# Patient Record
Sex: Female | Born: 1950 | Race: White | Hispanic: No | Marital: Married | Smoking: Former smoker
Health system: Southern US, Community
[De-identification: ages and names within clinical notes are randomized; demographics above are authoritative.]

## PROBLEM LIST (undated history)

## (undated) DIAGNOSIS — R918 Other nonspecific abnormal finding of lung field: Secondary | ICD-10-CM

## (undated) DIAGNOSIS — M549 Dorsalgia, unspecified: Secondary | ICD-10-CM

## (undated) DIAGNOSIS — J45909 Unspecified asthma, uncomplicated: Secondary | ICD-10-CM

## (undated) DIAGNOSIS — N200 Calculus of kidney: Secondary | ICD-10-CM

## (undated) DIAGNOSIS — G8929 Other chronic pain: Secondary | ICD-10-CM

## (undated) DIAGNOSIS — N814 Uterovaginal prolapse, unspecified: Secondary | ICD-10-CM

## (undated) DIAGNOSIS — F32A Depression, unspecified: Secondary | ICD-10-CM

## (undated) DIAGNOSIS — E079 Disorder of thyroid, unspecified: Secondary | ICD-10-CM

## (undated) DIAGNOSIS — T847XXA Infection and inflammatory reaction due to other internal orthopedic prosthetic devices, implants and grafts, initial encounter: Secondary | ICD-10-CM

## (undated) DIAGNOSIS — R590 Localized enlarged lymph nodes: Secondary | ICD-10-CM

## (undated) DIAGNOSIS — M5030 Other cervical disc degeneration, unspecified cervical region: Secondary | ICD-10-CM

## (undated) DIAGNOSIS — M1990 Unspecified osteoarthritis, unspecified site: Secondary | ICD-10-CM

## (undated) DIAGNOSIS — F329 Major depressive disorder, single episode, unspecified: Secondary | ICD-10-CM

## (undated) DIAGNOSIS — Z113 Encounter for screening for infections with a predominantly sexual mode of transmission: Secondary | ICD-10-CM

## (undated) DIAGNOSIS — E0590 Thyrotoxicosis, unspecified without thyrotoxic crisis or storm: Secondary | ICD-10-CM

## (undated) DIAGNOSIS — F419 Anxiety disorder, unspecified: Secondary | ICD-10-CM

## (undated) DIAGNOSIS — D649 Anemia, unspecified: Secondary | ICD-10-CM

## (undated) DIAGNOSIS — N390 Urinary tract infection, site not specified: Secondary | ICD-10-CM

## (undated) DIAGNOSIS — I4891 Unspecified atrial fibrillation: Secondary | ICD-10-CM

## (undated) DIAGNOSIS — J449 Chronic obstructive pulmonary disease, unspecified: Secondary | ICD-10-CM

## (undated) DIAGNOSIS — M869 Osteomyelitis, unspecified: Secondary | ICD-10-CM

## (undated) HISTORY — DX: Encounter for screening for infections with a predominantly sexual mode of transmission: Z11.3

## (undated) HISTORY — PX: TOTAL HIP ARTHROPLASTY: SHX124

## (undated) HISTORY — DX: Infection and inflammatory reaction due to other internal orthopedic prosthetic devices, implants and grafts, initial encounter: T84.7XXA

## (undated) HISTORY — PX: BACK SURGERY: SHX140

## (undated) HISTORY — PX: ABDOMINAL HYSTERECTOMY: SHX81

## (undated) HISTORY — DX: Anxiety disorder, unspecified: F41.9

## (undated) HISTORY — DX: Osteomyelitis, unspecified: M86.9

## (undated) HISTORY — PX: ANKLE SURGERY: SHX546

## (undated) HISTORY — DX: Chronic obstructive pulmonary disease, unspecified: J44.9

## (undated) HISTORY — DX: Localized enlarged lymph nodes: R59.0

## (undated) HISTORY — DX: Unspecified osteoarthritis, unspecified site: M19.90

## (undated) HISTORY — DX: Urinary tract infection, site not specified: N39.0

## (undated) HISTORY — DX: Anemia, unspecified: D64.9

## (undated) HISTORY — DX: Calculus of kidney: N20.0

## (undated) HISTORY — DX: Unspecified atrial fibrillation: I48.91

## (undated) HISTORY — DX: Thyrotoxicosis, unspecified without thyrotoxic crisis or storm: E05.90

## (undated) HISTORY — DX: Other nonspecific abnormal finding of lung field: R91.8

## (undated) HISTORY — DX: Unspecified asthma, uncomplicated: J45.909

---

## 1997-12-27 ENCOUNTER — Emergency Department (HOSPITAL_COMMUNITY): Admission: EM | Admit: 1997-12-27 | Discharge: 1997-12-27 | Payer: Self-pay | Admitting: Emergency Medicine

## 1999-02-20 ENCOUNTER — Emergency Department (HOSPITAL_COMMUNITY): Admission: EM | Admit: 1999-02-20 | Discharge: 1999-02-20 | Payer: Self-pay | Admitting: Emergency Medicine

## 2001-10-03 ENCOUNTER — Emergency Department (HOSPITAL_COMMUNITY): Admission: EM | Admit: 2001-10-03 | Discharge: 2001-10-04 | Payer: Self-pay | Admitting: Emergency Medicine

## 2001-10-04 ENCOUNTER — Encounter: Payer: Self-pay | Admitting: Emergency Medicine

## 2001-10-17 ENCOUNTER — Encounter: Payer: Self-pay | Admitting: Neurosurgery

## 2001-10-17 ENCOUNTER — Encounter: Admission: RE | Admit: 2001-10-17 | Discharge: 2001-10-17 | Payer: Self-pay | Admitting: Neurosurgery

## 2001-11-21 ENCOUNTER — Encounter: Payer: Self-pay | Admitting: Neurosurgery

## 2001-11-21 ENCOUNTER — Encounter: Admission: RE | Admit: 2001-11-21 | Discharge: 2001-11-21 | Payer: Self-pay | Admitting: Neurosurgery

## 2001-12-05 ENCOUNTER — Encounter: Admission: RE | Admit: 2001-12-05 | Discharge: 2001-12-05 | Payer: Self-pay | Admitting: Neurosurgery

## 2001-12-05 ENCOUNTER — Encounter: Payer: Self-pay | Admitting: Neurosurgery

## 2002-04-20 ENCOUNTER — Emergency Department (HOSPITAL_COMMUNITY): Admission: EM | Admit: 2002-04-20 | Discharge: 2002-04-20 | Payer: Self-pay | Admitting: Emergency Medicine

## 2002-08-03 ENCOUNTER — Encounter: Payer: Self-pay | Admitting: Emergency Medicine

## 2002-08-03 ENCOUNTER — Inpatient Hospital Stay (HOSPITAL_COMMUNITY): Admission: EM | Admit: 2002-08-03 | Discharge: 2002-08-06 | Payer: Self-pay | Admitting: Emergency Medicine

## 2002-08-17 ENCOUNTER — Ambulatory Visit: Admission: RE | Admit: 2002-08-17 | Discharge: 2002-08-17 | Payer: Self-pay | Admitting: Orthopaedic Surgery

## 2002-09-09 ENCOUNTER — Emergency Department (HOSPITAL_COMMUNITY): Admission: EM | Admit: 2002-09-09 | Discharge: 2002-09-09 | Payer: Self-pay | Admitting: Emergency Medicine

## 2004-12-20 ENCOUNTER — Emergency Department (HOSPITAL_COMMUNITY): Admission: EM | Admit: 2004-12-20 | Discharge: 2004-12-20 | Payer: Self-pay | Admitting: Emergency Medicine

## 2006-07-31 ENCOUNTER — Emergency Department (HOSPITAL_COMMUNITY): Admission: EM | Admit: 2006-07-31 | Discharge: 2006-07-31 | Payer: Self-pay | Admitting: Emergency Medicine

## 2006-10-17 ENCOUNTER — Inpatient Hospital Stay (HOSPITAL_COMMUNITY): Admission: EM | Admit: 2006-10-17 | Discharge: 2006-10-21 | Payer: Self-pay | Admitting: Emergency Medicine

## 2006-10-26 ENCOUNTER — Emergency Department (HOSPITAL_COMMUNITY): Admission: EM | Admit: 2006-10-26 | Discharge: 2006-10-26 | Payer: Self-pay | Admitting: *Deleted

## 2006-11-18 ENCOUNTER — Emergency Department (HOSPITAL_COMMUNITY): Admission: EM | Admit: 2006-11-18 | Discharge: 2006-11-18 | Payer: Self-pay | Admitting: Emergency Medicine

## 2006-11-18 ENCOUNTER — Ambulatory Visit (HOSPITAL_COMMUNITY): Admission: RE | Admit: 2006-11-18 | Discharge: 2006-11-18 | Payer: Self-pay | Admitting: *Deleted

## 2006-11-23 ENCOUNTER — Encounter (INDEPENDENT_AMBULATORY_CARE_PROVIDER_SITE_OTHER): Payer: Self-pay | Admitting: Specialist

## 2006-11-24 ENCOUNTER — Inpatient Hospital Stay (HOSPITAL_COMMUNITY): Admission: RE | Admit: 2006-11-24 | Discharge: 2006-11-25 | Payer: Self-pay | Admitting: Specialist

## 2007-02-18 ENCOUNTER — Emergency Department (HOSPITAL_COMMUNITY): Admission: EM | Admit: 2007-02-18 | Discharge: 2007-02-18 | Payer: Self-pay | Admitting: Emergency Medicine

## 2008-12-04 IMAGING — CT CT PELVIS W/O CM
2 of 3 series · 17 of 46 positions shown, 19 images · IV contrast (agent unspecified)
Comparison: none

CLINICAL DATA: Right flank pain. History of renal stones. 
 ABDOMEN CT WITHOUT CONTRAST:
TECHNIQUE: Multidetector CT imaging of the abdomen was performed following the standard protocol without IV contrast.
TECHNIQUE: Multidetector CT imaging of the pelvis was performed following the standard protocol without IV contrast.

[Series 2: abd/pelv w/o 5.0 b31f st · axial · non-contrast · 0.63mm/px · z∈[+803,+1128]mm · 14 of 75 slices shown, 16 images]
[im 5/75  soft-tissue]
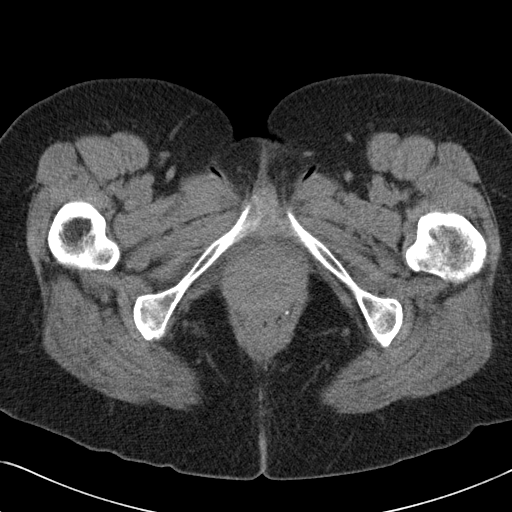
[im 5/75  bone]
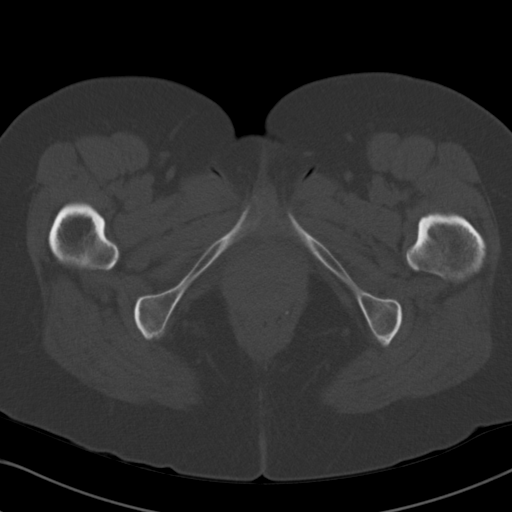
[im 10/75  soft-tissue]
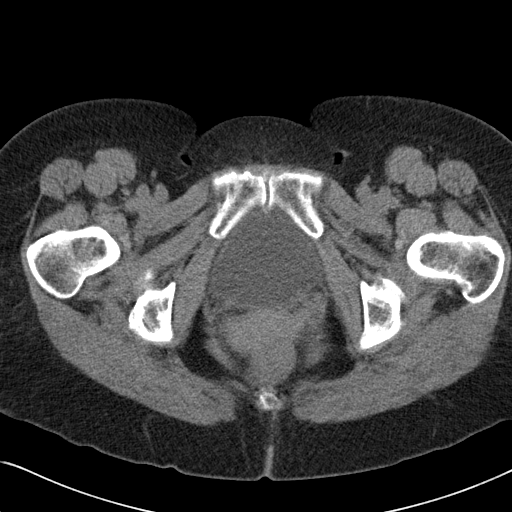
[im 15/75  soft-tissue]
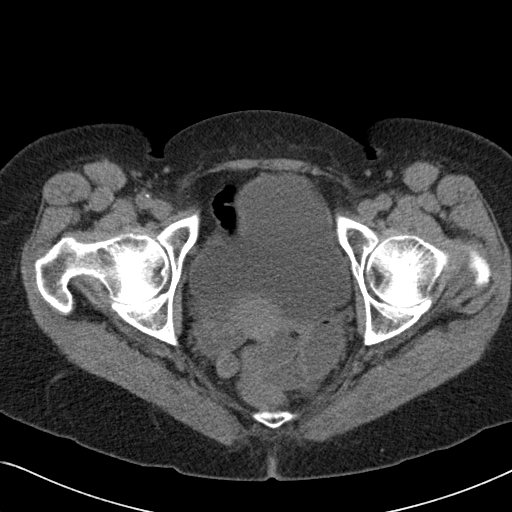
[im 20/75  soft-tissue]
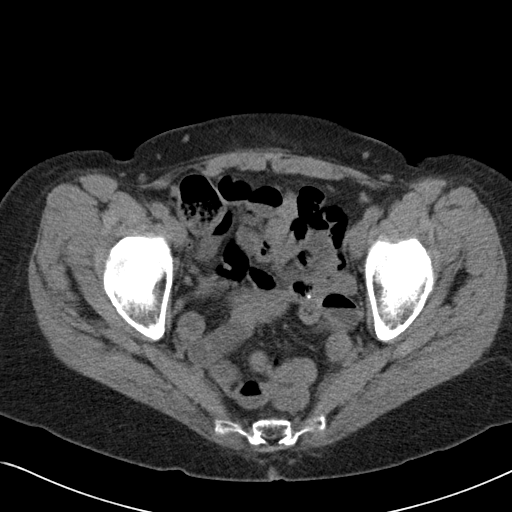
[im 24/75  soft-tissue]
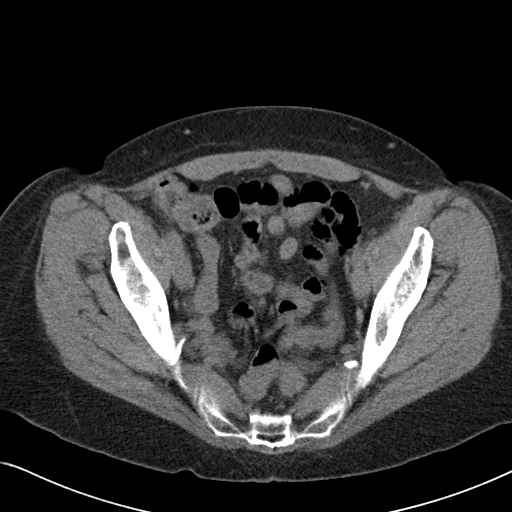
[im 29/75  soft-tissue]
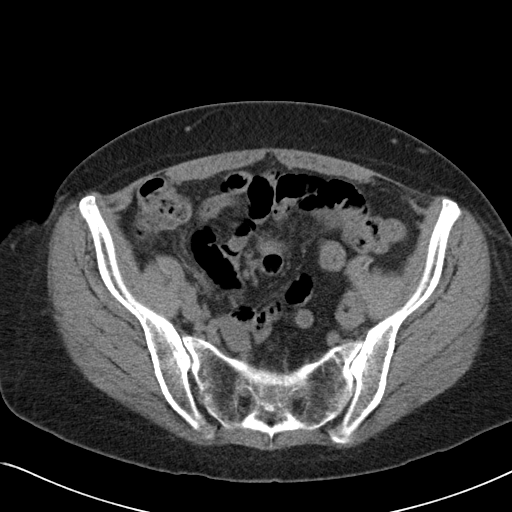
[im 34/75  soft-tissue]
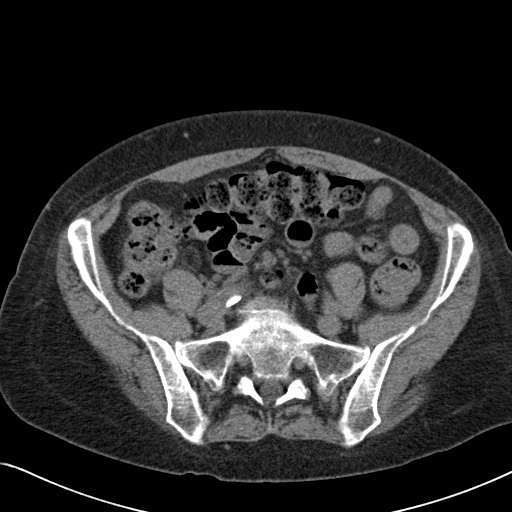
[im 41/75  soft-tissue]
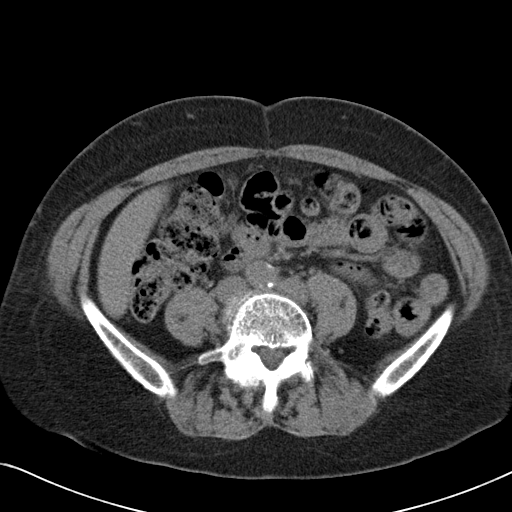
[im 46/75  soft-tissue]
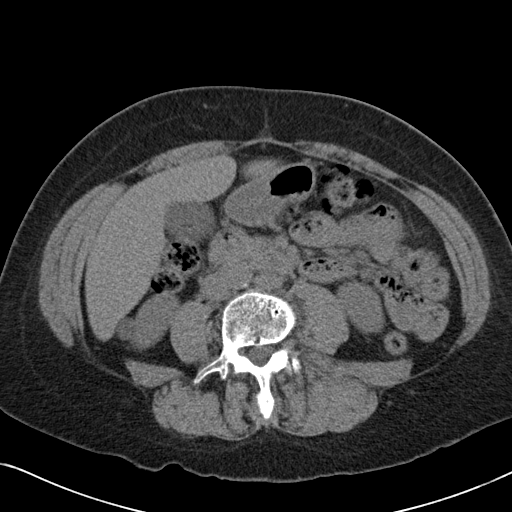
[im 46/75  bone]
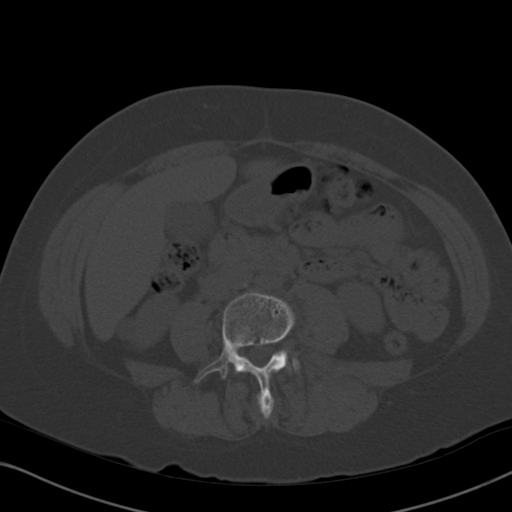
[im 51/75  soft-tissue]
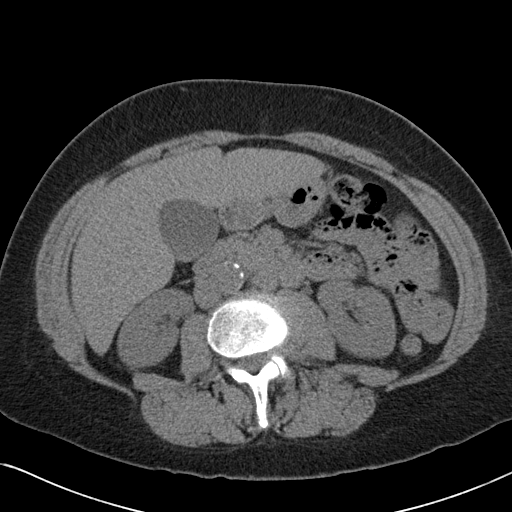
[im 55/75  soft-tissue]
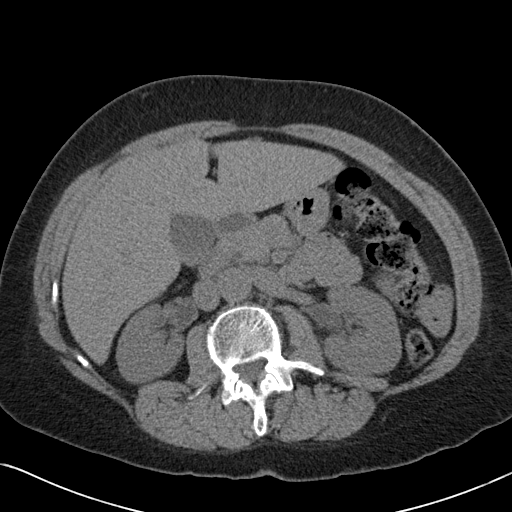
[im 60/75  soft-tissue]
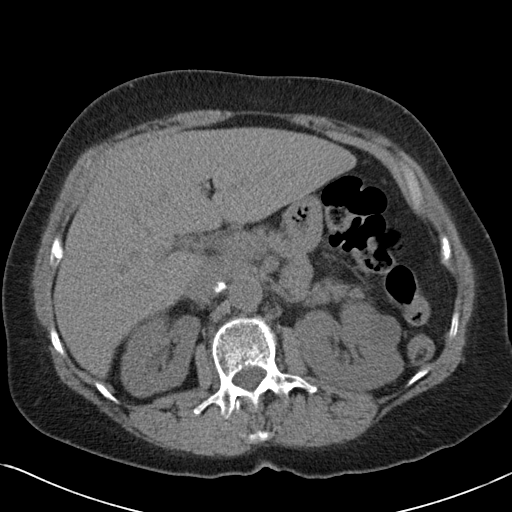
[im 65/75  soft-tissue]
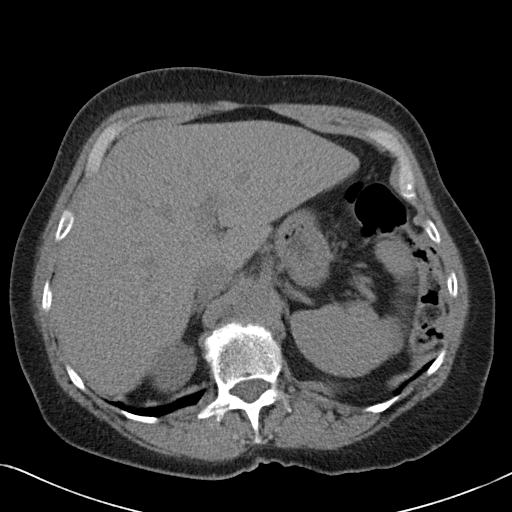
[im 70/75  soft-tissue]
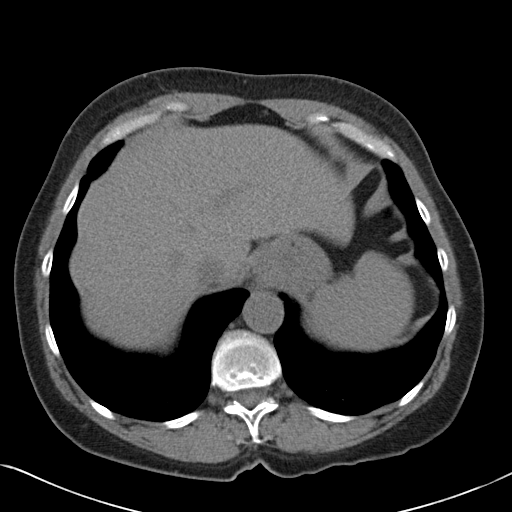

[Series 602: coronal a/p · coronal · 0.73mm/px · 3 of 69 slices shown]
[im 23/69  soft-tissue]
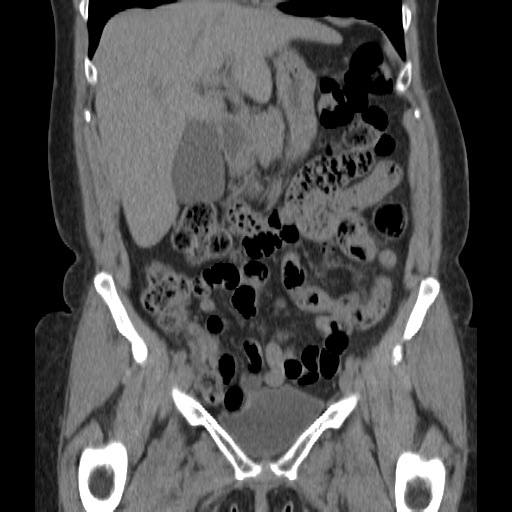
[im 31/69  soft-tissue]
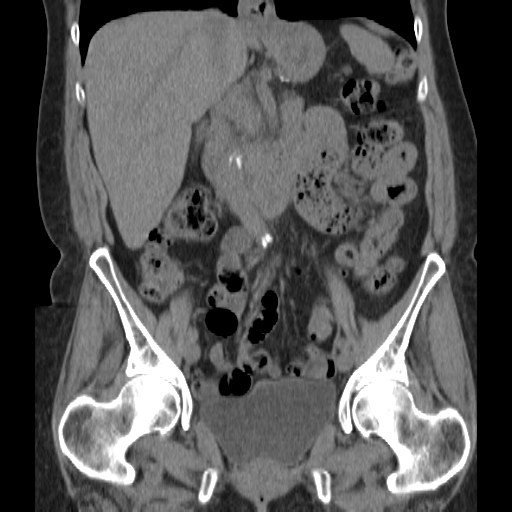
[im 38/69  soft-tissue]
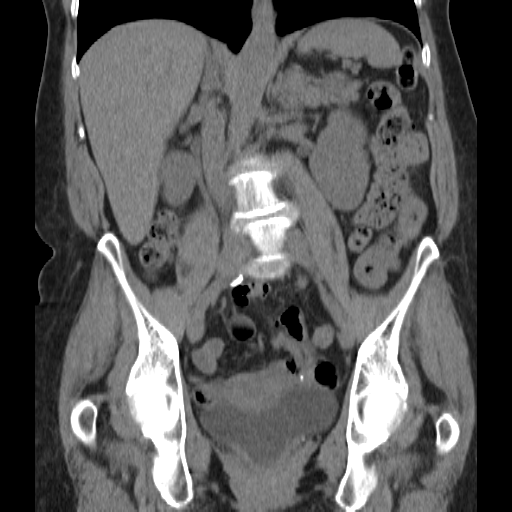

[17 of 46 positions shown; findings below may reference images not displayed]

FINDINGS: There is a small hiatal hernia present. There is a 1.4 cm in size lower pole right renal cyst present. There is also a 2 cm in size laterally located left renal cyst present. These are incompletely evaluated on this unenhanced study.  There is no evidence for ureteral or renal calculi and there is no hydronephrosis.  The liver, spleen, pancreas, and adrenal glands as visualized have a normal appearance. There is mildly increased density associated with the posterior wall of the gallbladder.  This may be secondary to a subtle gallstone and gallbladder ultrasound may be helpful in further evaluation of this. There is no adenopathy. Atherosclerotic calcifications noted within the abdominal aorta without evidence for an aneurysm.
IMPRESSION: 1.  No evidence for renal or ureteral calculi. 
 2.  Small bilateral renal cysts incompletely evaluated on this unenhanced CT of the abdomen. 
 3.  Possible small gallstone. This would be better evaluated by gallbladder ultrasound.
 4.  Small hiatal hernia. 
 PELVIS CT WITHOUT CONTRAST:
FINDINGS: There is no evidence for distal ureteral calculi or hydronephrosis and there is no pelvic mass or adenopathy.
IMPRESSION: Negative CT of the pelvis.

## 2008-12-04 IMAGING — CR DG CHEST 2V
2 series · 2 of 2 positions shown · non-contrast
Comparison: Currently there are no prior studies available for comparison.

CLINICAL DATA: Cough.  Discomfort.  
 CHEST - 2 VIEW:

[w chest pa]
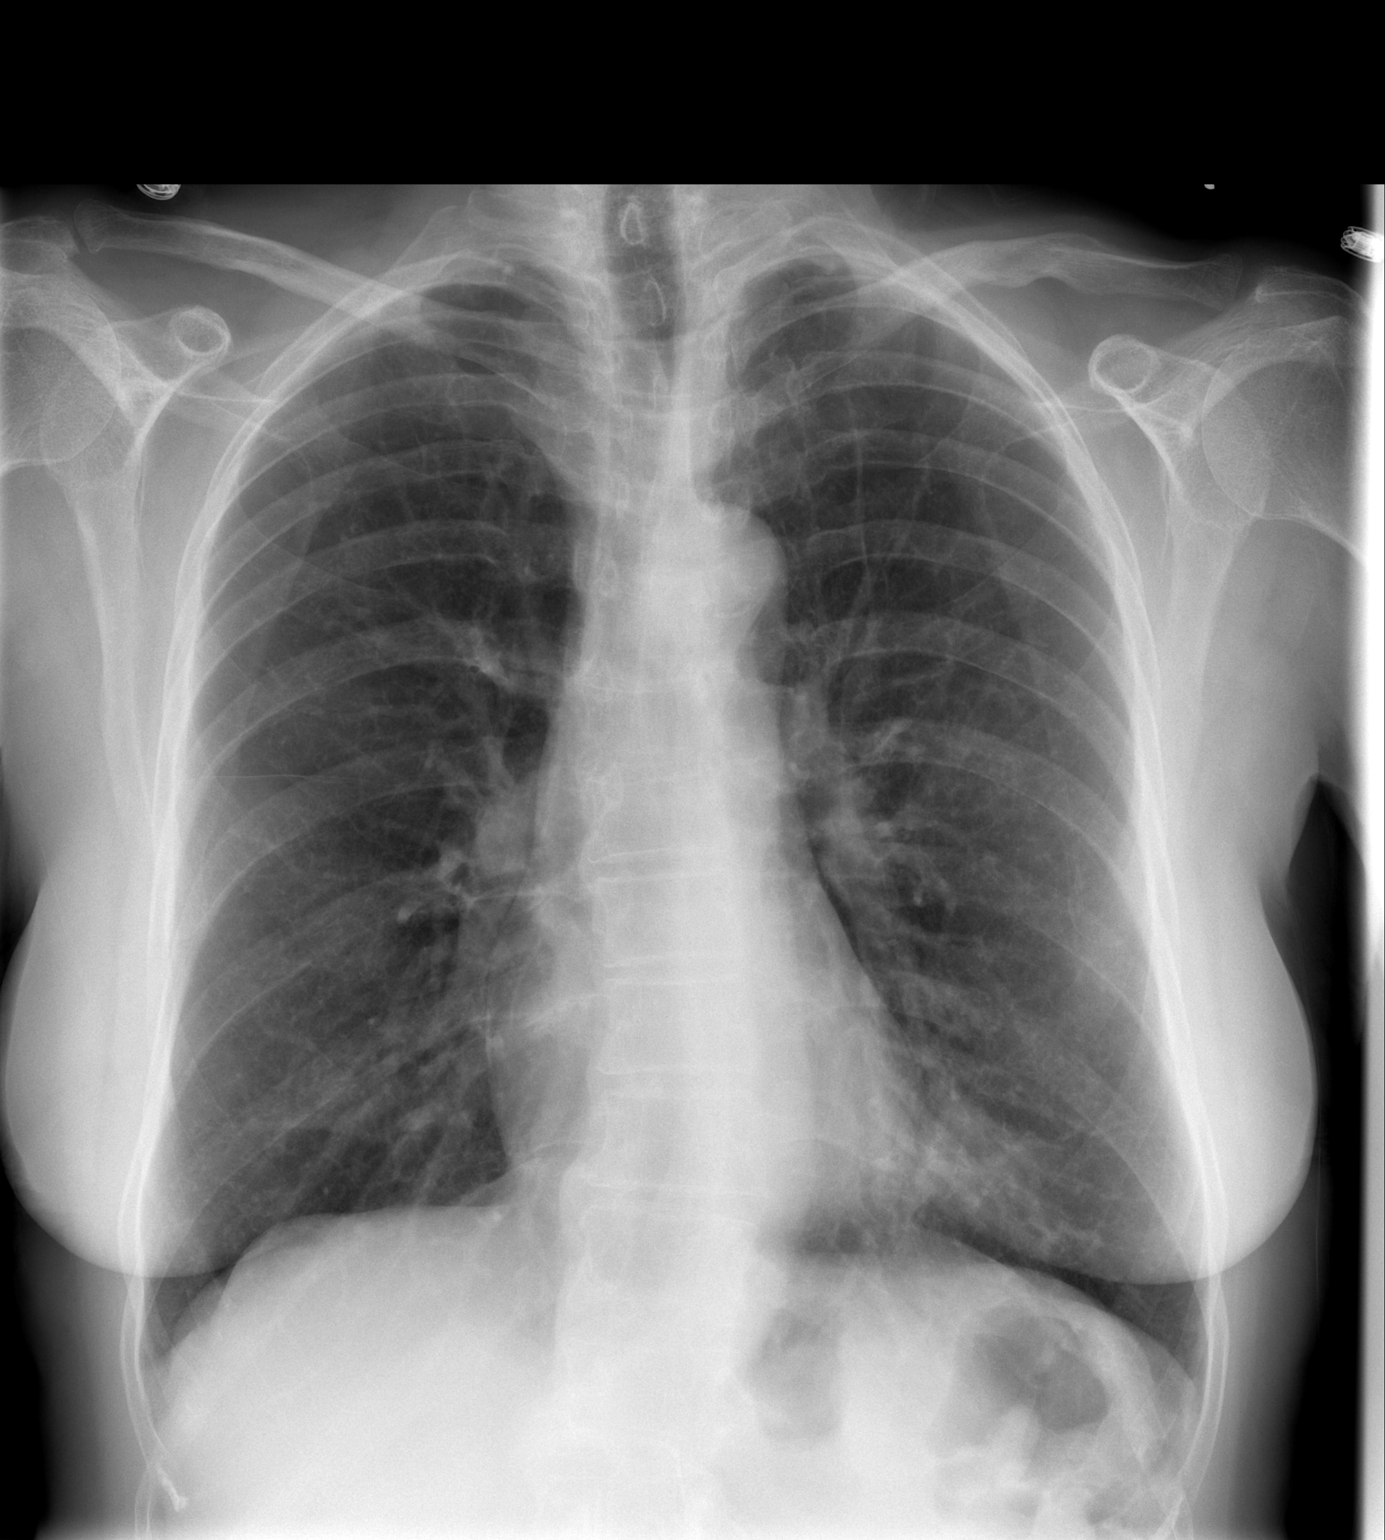

[w chest lat]
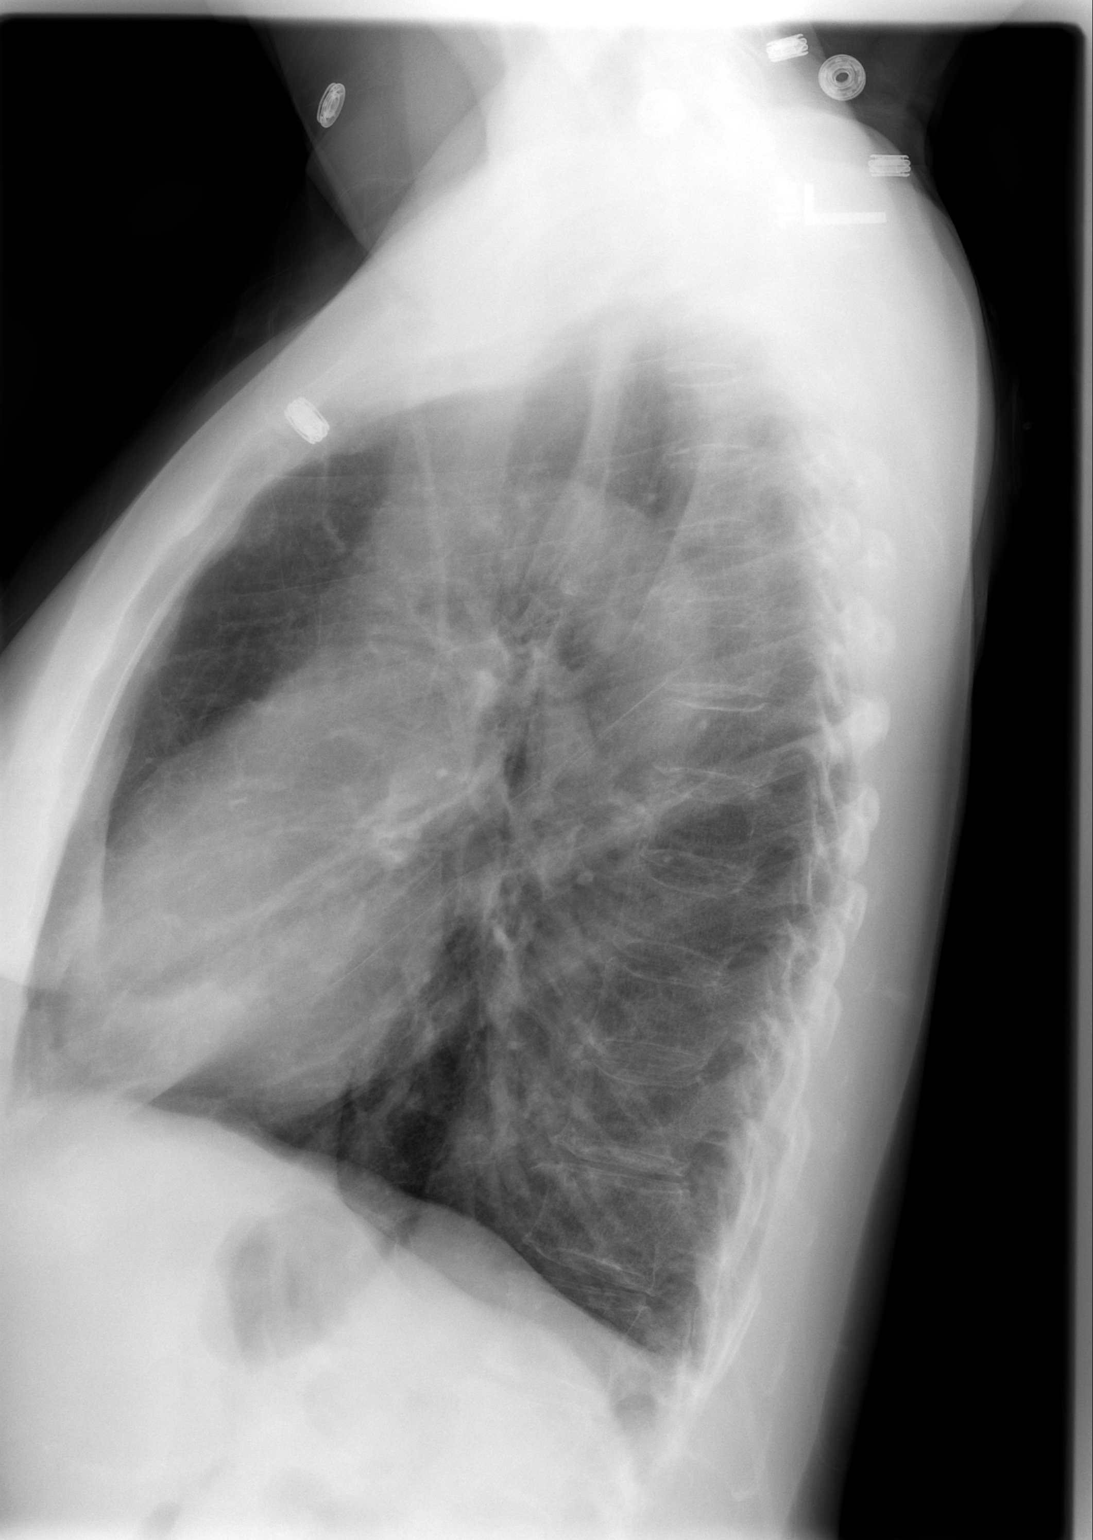

[2 of 2 positions shown; findings below may reference images not displayed]

FINDINGS: There is a patchy infiltrate seen within the lingula inferiorly located.  The right lung is clear.  The heart and mediastinal structures are normal.
IMPRESSION: Patchy lingular infiltrate.

## 2010-08-14 ENCOUNTER — Encounter: Payer: Self-pay | Admitting: Neurosurgery

## 2010-08-15 ENCOUNTER — Encounter: Payer: Self-pay | Admitting: Specialist

## 2010-12-10 NOTE — Discharge Summary (Signed)
Amy Hamilton, PICINICH NO.:  [id number]   MEDICAL RECORD NO.:  [id number]          PATIENT TYPE:  INP   LOCATION:  5706                         FACILITY:  [facility]   PHYSICIAN:  Theresia Bough, MD       DATE OF BIRTH:  [birth year]   DATE OF ADMISSION:  10/17/2006  DATE OF DISCHARGE:  10/21/2006                               DISCHARGE SUMMARY   FINAL DIAGNOSES:  1. Community-acquired pneumonia.  2. Urinary tract infection.  3. Lumbar 4 radiculopathy, Lumbar 3 to Lumbar 4 herniated disk.   DISCHARGE ACTIVITIES:  The patient is stable at the time of discharge.   DISPOSITION:  The patient will be discharged home.   DISCHARGE MEDICATIONS:  1. Levaquin 500 mg p.o. daily from tomorrow for the next five days.  2. Vicodin 5/500 mg 1 to 2 tablets p.o. q.6h. p.r.n. for pain.  3. Flexeril 10 mg p.o. b.i.d. p.r.n. for pain.  4. Levothyroxine 50 mcg daily.   HOSPITAL COURSE:  Amy Hamilton was admitted on October 17, 2006 because of  fever, chills, low back pain, and cough productive of yellow sputum.  She had a chest x-ray done when she came into the hospital which showed  patchy lingular infiltrate.  The patient was then admitted for IV  antibiotics.  The patient also had a CT of the pelvis done because of  low back pain which was negative.  The CT of the abdomen also did not  show any acute disease.  The patient had MRI of the L-spine done on  March 26 because of this chronic low back pain.  The MRI showed spinal  stenosis at L3 through L4, and also spinal stenosis at L4 through L5.  Disk protrusion at the L4 and L5.  Please refer to the MRI report on her  record.  The patient had consultation done on March 27 because of the  back pain.  She was seen by Dr. Channing Mutters because of the back pain.  Dr. Channing Mutters  would like to see the patient in his office after discharge.  The  initial plan was for the patient to have the urinary tract infection as  well as pneumonia treated in the hospital, and  then he is going to  follow up with the patient after discharge to see if she will need any  surgery on her back or if epidural steroids will be enough to take care  of her problems.   The patient has improved, but she is still having some pain on her back,  and I am going to discharge her on Vicodin for pain control until she is  able to see Dr. Trey Sailors in his office.   The patient's hematocrit is 32.2, platelets are 321, WBC is 6.9 which is  okay.  Other blood work shows sodium of 140, potassium 3.9, chloride  105, bicarbonate 20, BUN 2, creatinine 0.4, and glucose of 85.  The  patient's other  blood work shows iron level of 26 which is very low.  B12 level is 559.  Folic acid level is  12.  The patient will also be discharged on iron  because of low serum iron.   DISPOSITION:  The patient will be discharged this afternoon.      Theresia Bough, MD  Electronically Signed     GA/MEDQ  D:  10/21/2006  T:  10/21/2006  Job:  [id number]

## 2010-12-10 NOTE — Op Note (Signed)
[name], Amy Hamilton                           ACCOUNT NO.:  [id number]   MEDICAL RECORD NO.:  [id number]                   PATIENT TYPE:  INP   LOCATION:  5002                                 FACILITY:  [facility]   PHYSICIAN:  Mark C. Ophelia Charter, M.D.                 DATE OF BIRTH:  07-24-1951   DATE OF PROCEDURE:  08/04/2002  DATE OF DISCHARGE:                                 OPERATIVE REPORT   PREOPERATIVE DIAGNOSIS:  Right trimalleolar ankle fracture-dislocation.   POSTOPERATIVE DIAGNOSIS:  Right trimalleolar ankle fracture-dislocation.   PROCEDURE:  Open reduction and internal fixation, right trimalleolar ankle  fracture, with medial and lateral malleolar fixation.   SURGEON:  Mark C. Ophelia Charter, M.D.   ANESTHESIA:  GOT.  Marcaine skin local, 18 cubic centimeters 0.5% Marcaine.   ESTIMATED BLOOD LOSS:  50 cubic centimeters.   TOURNIQUET TIME:  350 x 32 minutes.   DESCRIPTION OF PROCEDURE:  After induction of general anesthesia,  orotracheal intubation, preoperative Ancef prophylaxis, standard Duraprep  prepping and draping with proximal thigh tourniquet application, the leg was  wrapped with an Esmarch and the tourniquet was inflated.  A sterile skin  marker was used, followed by application of a large glove over the toes to  isolate it and then a Betadine Vi-Drape.  A C-shaped incision was made  anterior to the medial malleolus.  The fracture was identified, the  saphenous vein protected, the fracture distracted, hematoma was evacuated  and irrigated.  The joint was washed out.  There were no free fragments in  the joint.  A soft sponge was placed and then the lateral incision was made,  opening the fibula.  Subperiosteal dissection over the fracture was  performed.  The fracture was a short oblique fracture, slightly more short  in its oblique nature than the usual supination and external rotation-type  injury.  The patient had a preoperative ankle dislocation, which was reduced  in the emergency room during the evening hours on 08/03/02.  The fibula was  reduced anatomically.  A six-hole one-third tubular plate was applied, held  with the self-retaining clamp.  Two distal 12-14 mm screws, cancellous, were  placed, which were unicortical.  Proximally bicortical 12 mm 3.5 cortical  screws were placed, securing the fibular plate.  Due to the short oblique  nature, an interfragmentary screw was unable to be placed since the fibular  fracture was slightly low and the interfrag would hit the second most distal  screw, which was cancellous.  The fracture was table.  Attention was turned  to the medial side.  The fracture was reduced with a towel clamp and a K-  wire was drilled across.  Two 45 mm cancellous partially-threaded screws  were used to compress the fracture, lagging across it.  A mortise view was  checked to make sure the screws ran directly up the midportion of the medial  malleolus into the metaphyseal bone.  The medial malleolus was anatomic on  the medial cortex.  After irrigation with saline  solution, both incisions were closed with 2-0 Vicryl, skin staple closure,  Marcaine infiltration, Xeroform, 4 x 4's, ABD, Webril, short-leg splint, and  Ace wrap.  Instrument count and needle count was correct.  The tourniquet  was deflated prior to closure.                                                 Mark C. Ophelia Charter, M.D.    MCY/MEDQ  D:  08/04/2002  T:  08/04/2002  Job:  [id number]

## 2010-12-10 NOTE — Consult Note (Signed)
Amy Hamilton, [name]:  [id number]   MEDICAL RECORD NO.:  [id number]          PATIENT TYPE:  INP   LOCATION:  5706                         FACILITY:  [facility]   PHYSICIAN:  Payton Doughty, M.D.      DATE OF BIRTH:  [birth year]   DATE OF CONSULTATION:  10/19/2006  DATE OF DISCHARGE:                                 CONSULTATION   Dr. _______ called as well as the [facility] G-team   PLACE OF CONSULT:  [address].   BODY OF TEXT:  I was called to see this 60 year old right-handed white  lady who was admitted to [facility] October 17, 2006, with pneumonia, fever,  chills, subsequently found to have pyelonephritis and low back pain.  MRI showed a disk stenosis at L3-4.   MEDICAL HISTORY:  Medical history is remarkable for hypothyroidism,  kidney stones.   MEDICATIONS:  Synthroid, Flexeril, and Vicodin.  She has no allergies.   PHYSICAL EXAMINATION:  She is awake and alert, complains of right leg  pain.  She is oriented x3.  Her cranial nerves are intact.  Motor exam  shows 5/5 strength throughout the upper and lower extremities, save for  the right dorsiflexors that are 5-/5.  Sensation is diminished in the  right L4 distribution.  Deep tendon reflexes are 1 at the knees, 1 at  the ankles, toes downgoing bilaterally.  Straight leg raise is positive  on the right.   CLINICAL IMPRESSION:  Herniated nucleus pulposus at L3-4 with mild right  L4 radiculopathy.  She is not an operative candidate now because of  pyelonephritis.  She can call my office at discharge.  I will see her in  my office once the infection clears.  If she needs an operation we can  do it, or treat with epidural steroids.           ______________________________  Payton Doughty, M.D.     MWR/MEDQ  D:  10/19/2006  T:  10/20/2006  Job:  [id number]

## 2010-12-10 NOTE — Op Note (Signed)
   NAMEARLETHA, MARSCHKE                           ACCOUNT NO.:  [id number]   MEDICAL RECORD NO.:  [id number]                   PATIENT TYPE:  INP   LOCATION:  5002                                 FACILITY:  [facility]   PHYSICIAN:  Mark C. Ophelia Charter, M.D.                 DATE OF BIRTH:  11-Jul-1951   DATE OF PROCEDURE:  DATE OF DISCHARGE:                                 OPERATIVE REPORT   INCOMPLETE   PREOPERATIVE DIAGNOSIS:  Right trimalleolar ankle fracture dislocation.   POSTOPERATIVE DIAGNOSIS:  Right trimalleolar ankle fracture dislocation.   PROCEDURE:  Open reduction/internal fixation right trimalleolar ankle  fracture with medial and lateral malleolar fixation.   SURGEON:  Mark C. Ophelia Charter, M.D.   ANESTHESIA:  GOT.   ESTIMATED BLOOD LOSS:  Less than 100 cc.   TOURNIQUET TIME:  34 minutes.   PROCEDURE:  After induction of general anesthesia orotracheal intubation,  preoperative Ancef prophylaxis, standard Duraprep was used up to the knee  and the usual stockinette, extremity sheets and drapes were applied.  The  stockinette was cut and a sterile glove was placed over the tips of the  toes.  Initially, a C-shaped medial incision was made, joint was distracted                                               Mark C. Ophelia Charter, M.D.    MCY/MEDQ  D:  08/04/2002  T:  08/04/2002  Job:  [id number]

## 2010-12-10 NOTE — Op Note (Signed)
NAMEJENNICA, TAGLIAFERRI NO.:  [id number]   MEDICAL RECORD NO.:  [id number]          PATIENT TYPE:  OIB   LOCATION:  5037                         FACILITY:  [facility]   PHYSICIAN:  Kerrin Champagne, M.D.   DATE OF BIRTH:  Jul 16, 1951   DATE OF PROCEDURE:  11/23/2006  DATE OF DISCHARGE:                               OPERATIVE REPORT   PREOPERATIVE DIAGNOSIS:  Right L3-4 herniated nucleus pulposus,  degenerative disk disease L3-4, L4-5 and L5-S1.   POSTOPERATIVE DIAGNOSIS:  Right L3-4 herniated nucleus pulposus with  free fragment extending downwards over the posterior aspect of the L4  vertebral body within the entry point to the neural foramen for the L4  nerve root.  Moderately severe lumbar spinal stenosis at the L3-4 level  with retrolisthesis at this segment.  Dural tear, right L3-4 and these  are in the form of blebs x2.   PROCEDURE PERFORMED:  Right L3-4 microdiskectomy converted to an open  microdiskectomy with right-sided dural repair x2, L3-4.  Excision of  free fragment right L3-4 decompressing the right L4 and L3 nerve roots.  Microscope was used during the procedure.  The patient had dural repair  x2 with 4-0 Nurolon and Tisseel.   SURGEON:  Dr. Vira Browns.   ASSISTANT:  None.   ANESTHESIA:  General via oral tracheal intubation, Dr. Diamantina Monks.   ESTIMATED BLOOD LOSS:  250-300 mL.   COMPLICATIONS:  Dural tear, right L3-4.  This was in the form of a bleb  3-4 mm length x2.  The patient had significant venous bleeding from  epidural veins over the right side at the L3-4 level and over the area  posterior and inferior to the L3-4 disk space on the right side.   HISTORY OF PRESENT ILLNESS:  The patient is a 60 year old female with a  70-month history of back pain with radiation of the right leg.  She had  been hospitalized recently with UTI and a pneumonia.  Seen during the  hospitalization for evaluation of the disk herniation.  Was told to be  seen in  follow-up and an outpatient basis.  Has had difficulty obtaining  a follow-up visit with the consulting position and requested evaluation  and was seen yesterday in our office.  Her findings consistent with  acute right L4 radiculopathy with positive sciatic tension sides.  She  is brought to the operating room with persistent intractable pain the  right lower extremity with sciatica and L4 distribution.  She has been  to the emergency room twice and is still requiring narcotic medicines.  She has weakness in right foot dorsiflexion and right knee extension.   INTRAOPERATIVE FINDINGS:  Herniated nucleus pulposus right L3-4 with  retrolisthesis of L3 on L4, lateral recess stenosis involving the right  L3-4 level, free fragment and into the entry point of the L4 nerve root  in the neural foramen.   DESCRIPTION OF PROCEDURE:  After adequate general anesthesia, the  patient and a prone position.  The [facility] fracture table was used with  chest rolls, pillows beneath the legs.  All pressure points well-padded.  Standard preoperative antibiotics of Ancef.  She underwent a standard  prep with DuraPrep solution lower dorsal spine to mid sacral level.  Was  draped in the usual manner.  The spinal needles were placed in the  expected L3-L4 level.  The upper needle was directly in line with the  upper aspect of the L4 pedicle just inferior or caudal to the L3-4 disk  space.  The incision made in the midline then at this level.  C-arm  fluoro was used to determine the correct alignment and position of this  needle.  Incision of the midline approximately 10 cm in length through  the skin and subcu layers down to the lumbodorsal fascia.  This was  incised on the right side and then the patient had the dilators placed  down along the right side at the L3-4 level.  Tubes were dilated up to 7  mm and then measured for depth and 50 mm tube measurement was chosen.  50 mm blades were then placed on the  distraction device.  The retractor  was then placed over the tubes and dilated.  This was then attached to  bars to the bed.  Headlight loupe magnification was used for the initial  portion of procedure, identifying the interlaminar space at the L3-4  level.  A Penfield four was placed at the interlaminar space at the L2-3  level and intraoperative C-arm fluoro demonstrated this to 2-3.  Exposure obtained medial and inferiorly in the next segment at the L3-4  level.  High-speed bur then used to remove a small portion the inferior  aspect of the lamina of L3 and medial 15-20% of the inferior articular  process of L3.  Right down to the ligament flavum.  Three and 2 mm  Kerrisons were then used carefully remove further bone from the inferior  aspect lamina of L3 up to the insertion of the ligament of flavum.  The  ligament of flavum was resected on the right side using 2 and 3 mm  Kerrisons.  The operating room microscope was then draped sterilely and  brought into the field after performing a radiograph with a Penfield  four at the L3-4 level, identifying this as 3-4.  Microscope brought  into the field, under the microscope then the thecal sac was able to be  retracted medially at the L3-4 level.  Disk protruding posteriorly at  the L3-4 segment noted and it was noted that we were still somewhat  caudal to the disk space so that Kerrison was used to remove further  inferior bone on the right side to further expose the right side disk  space L3-4.  Thecal sac retracted then the disk was entered using a 15  blade scalpel under the operating room microscope.  Pituitary rongeur  was used debride disk material from the subligamentous region here.  Additional disk material noted to extend inferiorly.  This was removed  using pituitary rongeurs both micro straight.  Disk space L3-4 was not  entered and no disk material was removed from within the disk space itself only fragments within the right  lateral recess and the  subligamentous area over the posterior aspect of disk right L3-4.  With  further exposure distally using 3 mm Kerrison the bleb noted over the  right side at the level of the takeoff the L4 nerve root out over the  posterior right side of the thecal sac.  This was at the mid  lamina  level on the right side.  This was then carefully exposed, cottonoids  applied and the superior lamina of L4 was carefully resected until the  bleb was easily identified.  Note that in order to perform this the tube  retractors were all removed.  Incision made in the midline inferiorly an  additional 1 to 1.5 cm.  Lumbodorsal fascia incised off the right side  of spinous process of L4 and L3 and Cobbs used to elevate the paralumbar  muscles.  Also McCullough retractor was inserted.  Under the operating  room microscope then the area of bleb was carefully evaluated and a  Kerrison used to resect further bone from the lamina of L4 superiorly  exposing this area.  Using a dural repair instruments then, Baer  retractors and the Castroviejo's were used for placement of 4-0 Nurolon  sutures.  Three in total were used to close the bleb superiorly at the  L3-4 level.  This provided a nice closure of this level.  Further  exposure caudally, removing more bone on the superior portion of the  lamina of L4, a second bleb was encountered distally or caudally in line  with the first.  This bleb measured about 4 mm in length from cranial to  caudal.  It was closed in a similar fashion using 4-0 Nurolon sutures.  Reapproximated the tissues on the right side.  After both blebs were  seen were closed, the patient was Valsalvaed to 40 mmHg with no evidence  of significant dural leak.  Thecal sac and the L4 nerve root retracted  medially and a fragment of disk material noted within the entry point to  the right L4 neural foramen and directly adjacent to the L4 pedicle in  its inferior one half.  This disk  material was removed using nerve hook.  Further epidural vein bleeding was encountered.  The patient lost a  significant amount of epidural blood as much as 200 mL while controlling  this area.  Control was obtained using both cottonoids, Gelfoam,  FloSeal.  A bipolar electrocautery was used carefully cauterize facette  vessels nearby but this did not appear to be the significant source of  the bleeding.  It was felt that epidural bleeding likely was the source  and also relief of congestion from the disk protrusion.  Once the  bleeding was controlled the L4 nerve root was evaluated and felt to be  exiting freely. Hockey stick neural probe could be passed out the neural  foramen as well as the L3 neural foramen.  The disk remained bulging  posteriorly at the L3-4 level but not herniated in a significant extent.  The patient did have retrolisthesis of the L3 on L4 vertebra.  Lateral recess was well decompressed.  This then with bleeding controlled, bone  wax applied to bleeding cancellous bone surfaces.  When the area was  nearly completely dry then Tisseel was used to place a film of  watertight seal over the posterior aspect of the right thecal sac and  the areas of previous dural repair.  With this we had completed repair.  The retractors were removed and soft tissues allowed to fall back into  place.  The patient then underwent layer closures with 0-0 Vicryl  sutures using UR6 needle.  Subcu layers approximated with interrupted 2-  0 Vicryl sutures, skin closed with a running subcu stitch of 4-0 Vicryl.  Steri-Strips were then applied, 4x4s and ABD pad fixed to the skin with  Hypafix tape.  The patient due to the length of time under anesthesia,  underwent Foley catheter placement after being turned to a supine  position.  She was then returned to her bed, reactivated, extubated and  returned to recovery room in satisfactory condition.  In recovery room  the patient demonstrated full  motor and sensory to her lower extremities  with relief of radicular symptoms.  Note that all instruments and sponge  counts were correct.      Kerrin Champagne, M.D.  Electronically Signed     JEN/MEDQ  D:  11/23/2006  T:  11/24/2006  Job:  [id number]

## 2010-12-10 NOTE — H&P (Signed)
[name], [name]:  [id number]   MEDICAL RECORD NO.:  [id number]          PATIENT TYPE:  INP   LOCATION:  5706                         FACILITY:  [facility]   PHYSICIAN:  Isidor Holts, M.D.  DATE OF BIRTH:  [birth year]   DATE OF ADMISSION:  10/17/2006  DATE OF DISCHARGE:                              HISTORY & PHYSICAL   PRIMARY CARE PHYSICIAN:  Dr. Jeannetta Nap, [place].   CHIEF COMPLAINT:  Chronic low back pain with exacerbation in the past 2  days.  Urinary frequency and chills, also productive cough for the past  2 weeks.   HISTORY OF PRESENT ILLNESS:  This is a 60 year old female. For past  medical history, see below.  According to the patient, she has been  troubled by chronic low back pain for months now.  However, since the  evening of October 16, 2006, the pain became quite severe and more on the  right side.  She had no associated fever, but she did experience chills.  She admits to urinary frequency, although she states that this is not a  new phenomenon, and denies dysuria.  She had no abdominal pain or  vomiting.  In addition, the patient complains of a cough which she has  had for approximately 1 month now.  However, in the past 2 weeks, the  cough as become more productive, she has been bringing up gunky,  yellowish phlegm and now experiences central chest pain on coughing.  She does not feel more short of breath than usual. She has tried over-  the-counter Tylenol as well as Alka-Seltzer Plus Cold, without any  improvement.  She also denies any sick contacts.  Eventually, because  her back pain seemed unabating, she decided to come to the emergency  department because she thought she might have kidney stones.   PAST MEDICAL HISTORY:  1. Hypothyroidism diagnosed 15 years ago.  2. Right nephrolithiasis diagnosed 40 years ago, status post      extraction at that time.  3. Right ankle fracture January 2004, status post ORIF.  4. Bilateral  tubal ligation decades ago, complicated by tubal      gestation and, therefore, reversed.  5. Smoking history.   MEDICATIONS:  1. Synthroid 50 mcg p.o. daily.  2. Flexeril 10 mg p.o. b.i.d.  3. Hydrocortisone/APAP 1 p.r.n.   ALLERGIES:  No known drug allergies.   REVIEW OF SYSTEMS:  As per HPI and Chief Complaint, otherwise negative.   SOCIAL HISTORY:  The patient is married.  She owns kennels and brings up  puppies.  Has 3 offspring, who are alive and well, two boys and one  girl.  She is a smoker, has been smoking for about 40 years,  approximately one pack of cigarettes per day.  Denies alcohol use or  drug abuse.   FAMILY HISTORY:  The patient's mother is a borderline diabetic and has  had multiple CVAs.  She is currently age 30 years. The patient's father  died at age 57 years, status post MI.  The rest of his medical history  is unknown.  PHYSICAL EXAMINATION:  VITAL SIGNS:  Temperature 97.9, pulse 64 per  minute and regular, respiratory rate 20, blood pressure 141/69 mmHg,  pulse oximeter 94% on room air.  GENERAL:  The patient does not appear in obvious acute distress, alert,  communicative, snuffling a lot, not short of breath at rest.  HEENT: No clinical pallor or jaundice.  No conjunctival injection.  Throat is clear.  NECK:  Supple.  JVP not seen.  No palpable lymphadenopathy.  No palpable  goiter.  CHEST:  Patient has basal crackles on the left.  No wheeze.  CARDIAC:  Heart sounds 1 and 2 heard, normal, regular, no murmurs.  ABDOMEN:  Full, soft and nontender.  There is no palpable organomegaly.  No palpable masses.  Normal bowel sounds.  EXTREMITIES:  Lower extremity examination, no pitting edema.  Palpable  peripheral pulses.  MUSCULOSKELETAL:  Examination is quite unremarkable.  CENTRAL NERVOUS SYSTEM: No focal neurologic deficits on gross  examination.   INVESTIGATIONS:  CBC: WBC 10.8, hemoglobin 11.8, hematocrit 33.9,  platelets 228.  Electrolytes:   Sodium 137, potassium 4.2, chloride 106,  CO2 27.9, BUN 6, creatinine 0.6, glucose 86.  Troponin I at point of  care less than 0.05.  Urinalysis shows pH 7.0, specific gravity 1.009,  wbc's 11-20, rbc's 0-2, bacteria many.   Chest x-ray dated October 17, 2006, shows lingular infiltrate.   On the left abdominal and pelvic CT scan dated October 17, 2006, shows no  renal or ureteric stone, query very small gallstones and small bilateral  renal cysts, small hiatal hernia.   ASSESSMENT AND PLAN:  1. Left lingular community-acquired pneumonia.  We shall admit the      patient, do blood cultures for completeness.  Commence treatment      with intravenous Rocephin and Azithromycin, utilize bronchodilator      nebulizers and oxygen supplements.   1. Acute pyelonephritis.  This should be adequately covered by      Rocephin. We shall send urine for culture and sensitivities, in      order to identify organism.   1. Dysthyroidism. We shall continue Synthroid, but check TSH for      completeness.   1. Smoking history.  We shall counsel appropriately, and utilize      Nicoderm CQ patch.   1. Chronic low back pain.  We shall continue Flexeril and p.r.n.      opioid analgesics.  Meanwhile, commence scheduled NSAIDS.   Further management will depend on clinical course.      Isidor Holts, M.D.  Electronically Signed     CO/MEDQ  D:  10/17/2006  T:  10/17/2006  Job:  [id number]

## 2010-12-10 NOTE — Discharge Summary (Signed)
Amy Hamilton, Amy Hamilton                           ACCOUNT NO.:  [id number]   MEDICAL RECORD NO.:  [id number]                   PATIENT TYPE:  INP   LOCATION:  5002                                 FACILITY:  [facility]   PHYSICIAN:  Genene Churn. Barry Dienes, P.A.                DATE OF BIRTH:  29-Mar-1951   DATE OF ADMISSION:  08/03/2002  DATE OF DISCHARGE:  08/06/2002                                 DISCHARGE SUMMARY   FINAL DIAGNOSES:  1. Status post open reduction and internal fixation, right ankle, August 04, 2002.  2. Fall on stairs/steps NEC.   HISTORY OF PRESENT ILLNESS:  The patient is a 60 year old white female who  presented the [facility] emergency room on August 03, 2002, after  a fall at her home. The patient twisted her right ankle with x-ray showing a  trimalleolar ankle fracture.   LABORATORY DATA:  Preadmission labs:  WBC 8.9, RBC 3.77, hemoglobin 12.4,  hematocrit 36.3, platelets 312. Sodium 137, potassium 3.8, chloride 106,  glucose 103, BUN 8.   Chest x-ray showed no acute disease. An EKG had normal sinus rhythm.   HOSPITAL COURSE:  On August 04, 2002, the patient was taken to the [facility] operating room and an ORIF, right trimalleolar ankle fracture  procedure was performed, surgeon Loraine Leriche C. Ophelia Charter, M.D., anesthesia general.  Estimated blood loss was 50 cc. There were no surgical or anesthesia  complications and the patient was transferred to recovery in stable  condition. Tourniquet time was 30  minutes.   On August 05, 2002, vital signs were stable. The patient was afebrile.  Physical therapy was ordered with no weightbearing on the right lower  extremity. The IV was unplugged.   On August 06, 2002, the patient was  doing well, temperature 100.2.  A  chest x-ray was ordered secondary to cough and fever. A wrist x-ray was also  ordered. Both were negative. The patient was ready for discharge to home.   MEDICATIONS:  1. Tylox 1 to  2 tablets  q.4-6h. p.r.n.  2. Resume previous home medications.   DISPOSITION:  Discharged to home in good and stable condition.   DISCHARGE INSTRUCTIONS:  The patient will work with home health physical  therapy to improve ambulation with crutches. She will be non weightbearing  on the right lower extremity for 6 to 8 weeks. She was instructed to keep  the foot elevated as much as possible to decrease swelling.   FOLLOW UP:  She will follow up in the office in one week for recheck with  repeat x-rays.                                               Fayrene Fearing  Lucia Gaskins, P.A.   JMO/MEDQ  D:  10/01/2002  T:  10/01/2002  Job:  [phone]

## 2011-05-09 LAB — RAPID STREP SCREEN (MED CTR MEBANE ONLY)

## 2012-08-07 ENCOUNTER — Other Ambulatory Visit: Payer: Self-pay | Admitting: Family Medicine

## 2012-08-07 DIAGNOSIS — Z1231 Encounter for screening mammogram for malignant neoplasm of breast: Secondary | ICD-10-CM

## 2012-08-28 ENCOUNTER — Ambulatory Visit: Payer: Self-pay

## 2012-10-17 ENCOUNTER — Encounter (HOSPITAL_COMMUNITY): Payer: Self-pay | Admitting: Family Medicine

## 2012-10-17 ENCOUNTER — Inpatient Hospital Stay (HOSPITAL_COMMUNITY): Payer: Medicaid Other

## 2012-10-17 ENCOUNTER — Inpatient Hospital Stay (HOSPITAL_COMMUNITY)
Admission: EM | Admit: 2012-10-17 | Discharge: 2012-10-22 | DRG: 481 | Disposition: A | Discharge status: Rehabilitation Facility | Payer: Medicaid Other | Attending: Internal Medicine | Admitting: Internal Medicine

## 2012-10-17 ENCOUNTER — Emergency Department (HOSPITAL_COMMUNITY): Payer: Medicaid Other

## 2012-10-17 DIAGNOSIS — M549 Dorsalgia, unspecified: Secondary | ICD-10-CM | POA: Diagnosis present

## 2012-10-17 DIAGNOSIS — F17200 Nicotine dependence, unspecified, uncomplicated: Secondary | ICD-10-CM | POA: Diagnosis present

## 2012-10-17 DIAGNOSIS — Y92009 Unspecified place in unspecified non-institutional (private) residence as the place of occurrence of the external cause: Secondary | ICD-10-CM

## 2012-10-17 DIAGNOSIS — Z79899 Other long term (current) drug therapy: Secondary | ICD-10-CM

## 2012-10-17 DIAGNOSIS — S72002A Fracture of unspecified part of neck of left femur, initial encounter for closed fracture: Secondary | ICD-10-CM

## 2012-10-17 DIAGNOSIS — E039 Hypothyroidism, unspecified: Secondary | ICD-10-CM | POA: Diagnosis present

## 2012-10-17 DIAGNOSIS — W19XXXA Unspecified fall, initial encounter: Secondary | ICD-10-CM

## 2012-10-17 DIAGNOSIS — M545 Low back pain, unspecified: Secondary | ICD-10-CM | POA: Diagnosis present

## 2012-10-17 DIAGNOSIS — F32A Depression, unspecified: Secondary | ICD-10-CM | POA: Diagnosis present

## 2012-10-17 DIAGNOSIS — S72009A Fracture of unspecified part of neck of unspecified femur, initial encounter for closed fracture: Secondary | ICD-10-CM

## 2012-10-17 DIAGNOSIS — F3289 Other specified depressive episodes: Secondary | ICD-10-CM | POA: Diagnosis present

## 2012-10-17 DIAGNOSIS — D62 Acute posthemorrhagic anemia: Secondary | ICD-10-CM | POA: Diagnosis not present

## 2012-10-17 DIAGNOSIS — F329 Major depressive disorder, single episode, unspecified: Secondary | ICD-10-CM

## 2012-10-17 DIAGNOSIS — K5900 Constipation, unspecified: Secondary | ICD-10-CM | POA: Diagnosis present

## 2012-10-17 DIAGNOSIS — S72143A Displaced intertrochanteric fracture of unspecified femur, initial encounter for closed fracture: Principal | ICD-10-CM | POA: Diagnosis present

## 2012-10-17 DIAGNOSIS — W010XXA Fall on same level from slipping, tripping and stumbling without subsequent striking against object, initial encounter: Secondary | ICD-10-CM | POA: Diagnosis present

## 2012-10-17 HISTORY — DX: Major depressive disorder, single episode, unspecified: F32.9

## 2012-10-17 HISTORY — DX: Disorder of thyroid, unspecified: E07.9

## 2012-10-17 HISTORY — DX: Hypothyroidism, unspecified: E03.9

## 2012-10-17 HISTORY — DX: Depression, unspecified: F32.A

## 2012-10-17 HISTORY — DX: Unspecified place in unspecified non-institutional (private) residence as the place of occurrence of the external cause: W19.XXXA

## 2012-10-17 HISTORY — DX: Dorsalgia, unspecified: M54.9

## 2012-10-17 HISTORY — DX: Fracture of unspecified part of neck of left femur, initial encounter for closed fracture: S72.002A

## 2012-10-17 LAB — CBC WITH DIFFERENTIAL/PLATELET
Basophils Absolute: 0 10*3/uL (ref 0.0–0.1)
Eosinophils Absolute: 0.4 10*3/uL (ref 0.0–0.7)
Eosinophils Relative: 6 % — ABNORMAL HIGH (ref 0–5)
HCT: 32 % — ABNORMAL LOW (ref 36.0–46.0)
Lymphocytes Relative: 23 % (ref 12–46)
MCH: 31.1 pg (ref 26.0–34.0)
MCHC: 34.7 g/dL (ref 30.0–36.0)
MCV: 89.6 fL (ref 78.0–100.0)
Monocytes Absolute: 0.4 10*3/uL (ref 0.1–1.0)
Platelets: 312 10*3/uL (ref 150–400)
RDW: 12.3 % (ref 11.5–15.5)
WBC: 7.2 10*3/uL (ref 4.0–10.5)

## 2012-10-17 LAB — BASIC METABOLIC PANEL
BUN: 13 mg/dL (ref 6–23)
CO2: 27 mEq/L (ref 19–32)
Calcium: 8.9 mg/dL (ref 8.4–10.5)
Chloride: 99 mEq/L (ref 96–112)
Creatinine, Ser: 0.6 mg/dL (ref 0.50–1.10)
Glucose, Bld: 94 mg/dL (ref 70–99)
Sodium: 134 mEq/L — ABNORMAL LOW (ref 135–145)

## 2012-10-17 MED ORDER — OXYCODONE HCL 5 MG PO TABS
10.0000 mg | ORAL_TABLET | ORAL | Status: DC | PRN
  Administered 2012-10-17 – 2012-10-22 (×10): 10 mg via ORAL
  Filled 2012-10-17 (×15): qty 2

## 2012-10-17 MED ORDER — POLYETHYLENE GLYCOL 3350 17 G PO PACK: 17.0000 g | PACK | Freq: Every day | ORAL | Status: DC | PRN

## 2012-10-17 MED ORDER — ONDANSETRON HCL 4 MG/2ML IJ SOLN
4.0000 mg | Freq: Once | INTRAMUSCULAR | Status: AC
  Administered 2012-10-17: 4 mg via INTRAVENOUS
  Filled 2012-10-17: qty 2

## 2012-10-17 MED ORDER — HYDROCODONE-ACETAMINOPHEN 5-325 MG PO TABS: 1.0000 | ORAL_TABLET | Freq: Four times a day (QID) | ORAL | Status: DC | PRN

## 2012-10-17 MED ORDER — MORPHINE SULFATE 2 MG/ML IJ SOLN
0.5000 mg | INTRAMUSCULAR | Status: DC | PRN
  Administered 2012-10-17 – 2012-10-18 (×2): 0.5 mg via INTRAVENOUS
  Filled 2012-10-17 (×2): qty 1

## 2012-10-17 MED ORDER — DOCUSATE SODIUM 100 MG PO CAPS
100.0000 mg | ORAL_CAPSULE | Freq: Two times a day (BID) | ORAL | Status: DC
  Administered 2012-10-18 – 2012-10-22 (×9): 100 mg via ORAL
  Filled 2012-10-17 (×10): qty 1

## 2012-10-17 MED ORDER — FENTANYL CITRATE 0.05 MG/ML IJ SOLN
50.0000 ug | Freq: Once | INTRAMUSCULAR | Status: AC
  Administered 2012-10-17: 50 ug via INTRAVENOUS

## 2012-10-17 MED ORDER — HYDROMORPHONE HCL PF 1 MG/ML IJ SOLN
1.0000 mg | Freq: Once | INTRAMUSCULAR | Status: AC
  Administered 2012-10-17: 1 mg via INTRAVENOUS
  Filled 2012-10-17: qty 1

## 2012-10-17 MED ORDER — METHOCARBAMOL 100 MG/ML IJ SOLN
500.0000 mg | Freq: Four times a day (QID) | INTRAMUSCULAR | Status: DC | PRN
  Administered 2012-10-18: 500 mg via INTRAVENOUS
  Filled 2012-10-17 (×2): qty 5

## 2012-10-17 MED ORDER — FENTANYL CITRATE 0.05 MG/ML IJ SOLN
50.0000 ug | INTRAMUSCULAR | Status: AC | PRN
  Administered 2012-10-17 (×2): 50 ug via INTRAVENOUS
  Filled 2012-10-17 (×2): qty 2

## 2012-10-17 NOTE — ED Provider Notes (Signed)
History     CSN: [id number]  Arrival date & time 10/17/12  1517   First MD Initiated Contact with Patient 10/17/12 1518      Chief Complaint  Patient presents with  . Fall     Patient is a 62 y.o. female presenting with fall. The history is provided by the patient.  Fall Incident onset: just prior to arrival. The point of impact was the left hip. The pain is present in the left hip. The pain is moderate. She was not ambulatory at the scene. Pertinent negatives include no visual change, no abdominal pain, no nausea, no vomiting, no headaches and no loss of consciousness. The symptoms are aggravated by extension. Treatment on scene includes a c-collar. She has tried rest for the symptoms. The treatment provided mild relief.  pt reports she was in the kitchen, making jello for her family.  She reports she slipped and fell and injured her left hip No LOC She may have hit her head but she denies HA/LOC/nausea/vomiting/visual changes No neck or back pain No cp/sob/dizziness/abdominal pain No focal weakness She reports pain in left hip/knee.   Past Medical History  Diagnosis Date  . Thyroid disease   . Depression     Surgical history - back surgery  History reviewed. No pertinent family history.  History  Substance Use Topics  . Smoking status: Not on file  . Smokeless tobacco: Not on file  . Alcohol Use: No    OB History   Grav Para Term Preterm Abortions TAB SAB Ect Mult Living                  Review of Systems  HENT: Negative for neck pain.   Respiratory: Negative for chest tightness and shortness of breath.   Cardiovascular: Negative for chest pain.  Gastrointestinal: Negative for nausea, vomiting and abdominal pain.  Musculoskeletal: Negative for back pain.  Neurological: Negative for dizziness, loss of consciousness, weakness and headaches.  All other systems reviewed and are negative.    Allergies  Review of patient's allergies indicates no known  allergies.  Home Medications   Current Outpatient Rx  Name  Route  Sig  Dispense  Refill  . ACAI BERRY PO   Oral   Take 2 tablets by mouth 2 (two) times daily. For GI clense         . FLUoxetine (PROZAC) 40 MG capsule   Oral   Take 80 mg by mouth daily.         Marland Kitchen HYDROcodone-acetaminophen (NORCO) 7.5-325 MG per tablet   Oral   Take 1-2 tablets by mouth every 6 (six) hours as needed for pain (not exceed 6 tablets in 24 hour period).         Marland Kitchen levothyroxine (SYNTHROID, LEVOTHROID) 25 MCG tablet   Oral   Take 25 mcg by mouth daily.         Marland Kitchen lisdexamfetamine (VYVANSE) 70 MG capsule   Oral   Take 70 mg by mouth every morning.         . Melatonin 5 MG SUBL   Sublingual   Place 10 tablets under the tongue daily as needed (for sleep).         . Multiple Vitamins-Minerals (ALIVE WOMENS 50+) TABS   Oral   Take 1 tablet by mouth daily.         Marland Kitchen neomycin-bacitracin-polymyxin (NEOSPORIN) ointment   Topical   Apply 1 application topically every 12 (twelve) hours. For nose  BP 144/84  Pulse 74  Temp(Src) 98.7 F (37.1 C) (Oral)  Resp 20  SpO2 100%  Physical Exam CONSTITUTIONAL: Well developed/well nourished HEAD: Normocephalic/atraumatic EYES: EOMI/PERRL ENMT: Mucous membranes moist, No evidence of facial/nasal trauma NECK: supple no meningeal signs SPINE:entire spine nontender, NEXUS criteria met, no obvious bruising to back CV: S1/S2 noted, no murmurs/rubs/gallops noted LUNGS: Lungs are clear to auscultation bilaterally, no apparent distress ABDOMEN: soft, nontender, no rebound or guarding GU:no cva tenderness NEURO: Pt is awake/alert, moves all extremitiesx4 EXTREMITIES: pt has left hip flexed and has tendeness with movement.  She also has tenderness to left knee.  There is no tenderness to left ankle/foot.  All other extremities/joints palpated/ranged and nontender Distal pulses are intact on left LE SKIN: warm, color normal PSYCH: no  abnormalities of mood noted  ED Course  Procedures   Labs Reviewed  BASIC METABOLIC PANEL  CBC WITH DIFFERENTIAL  TYPE AND SCREEN  4:06 PM Pt presents s/p mechanical fall.  She may have injured her left hip.  She has no back or neck pain and her cspine is clinically cleared.  Will obtain imaging and follow closely Will defer CT head as no LOC, denies headache and I don't see any signs of obvious trauma to head 6:39 PM Pt with continued pain I discussed xray findings with patient (she has seen dr Otelia Sergeant in the past for orthopedic issues) Spoke to dr dean who will see patient D/w dr Isidoro Donning to see patient for admission   MDM  Nursing notes including past medical history and social history reviewed and considered in documentation xrays reviewed and considered Labs/vital reviewed and considered        Date: 10/17/2012  Rate: 76  Rhythm: normal sinus rhythm  QRS Axis: normal  Intervals: normal  ST/T Wave abnormalities: nonspecific ST changes  Conduction Disutrbances:none  Narrative Interpretation:   Old EKG Reviewed: none available at time of interpretation    Joya Gaskins, MD 10/17/12 1849

## 2012-10-17 NOTE — ED Notes (Signed)
Per pt sts slip and fall and now unable to move left hip or lay on left hip. sts pain shooting down leg. Pulses good. No shortening or rotation.

## 2012-10-17 NOTE — ED Notes (Signed)
Patient transported to X-ray 

## 2012-10-17 NOTE — ED Notes (Signed)
Pt placed on bedpan

## 2012-10-17 NOTE — Consult Note (Signed)
Reason for Consult:hip pain Referring Physician: Dr Isidoro Donning  Amy Hamilton is an 62 y.o. female.  HPI: Amy Hamilton is a 62 year old ambulatory female who fell today while making Jell-O jugulars in her kitchen. She lives with her 48 year old son. She is currently not working. She smokes 2 cigarettes a day. Patient fell and did not lose consciousness. She denies any other orthopedic complaints. She reports significant left hip pain and inability to ambulate. There is no family history of deep vein thrombosis or pulmonary embolism  Past Medical History  Diagnosis Date  . Thyroid disease   . Depression     History reviewed. No pertinent past surgical history.  History reviewed. No pertinent family history.  Social History:  reports that she does not drink alcohol. Her tobacco and drug histories are not on file.  Allergies: No Known Allergies  Medications: I have reviewed the patient's current medications.  Results for orders placed during the hospital encounter of 10/17/12 (from the past 48 hour(s))  BASIC METABOLIC PANEL     Status: Abnormal   Collection Time    10/17/12  3:40 PM      Result Value Range   Sodium 134 (*) 135 - 145 mEq/L   Potassium 3.9  3.5 - 5.1 mEq/L   Chloride 99  96 - 112 mEq/L   CO2 27  19 - 32 mEq/L   Glucose, Bld 94  70 - 99 mg/dL   BUN 13  6 - 23 mg/dL   Creatinine, Ser 4.54  0.50 - 1.10 mg/dL   Calcium 8.9  8.4 - 09.8 mg/dL   GFR calc non Af Amer >90  >90 mL/min   GFR calc Af Amer >90  >90 mL/min   Comment:            The eGFR has been calculated     using the CKD EPI equation.     This calculation has not been     validated in all clinical     situations.     eGFR's persistently     <90 mL/min signify     possible Chronic Kidney Disease.  CBC WITH DIFFERENTIAL     Status: Abnormal   Collection Time    10/17/12  3:40 PM      Result Value Range   WBC 7.2  4.0 - 10.5 K/uL   RBC 3.57 (*) 3.87 - 5.11 MIL/uL   Hemoglobin 11.1 (*) 12.0 - 15.0 g/dL   HCT 11.9  (*) 14.7 - 46.0 %   MCV 89.6  78.0 - 100.0 fL   MCH 31.1  26.0 - 34.0 pg   MCHC 34.7  30.0 - 36.0 g/dL   RDW 82.9  56.2 - 13.0 %   Platelets 312  150 - 400 K/uL   Neutrophils Relative 66  43 - 77 %   Neutro Abs 4.7  1.7 - 7.7 K/uL   Lymphocytes Relative 23  12 - 46 %   Lymphs Abs 1.6  0.7 - 4.0 K/uL   Monocytes Relative 5  3 - 12 %   Monocytes Absolute 0.4  0.1 - 1.0 K/uL   Eosinophils Relative 6 (*) 0 - 5 %   Eosinophils Absolute 0.4  0.0 - 0.7 K/uL   Basophils Relative 0  0 - 1 %   Basophils Absolute 0.0  0.0 - 0.1 K/uL  TYPE AND SCREEN     Status: None   Collection Time    10/17/12  3:50 PM  Result Value Range   ABO/RH(D) O POS     Antibody Screen NEG     Sample Expiration 10/20/2012    ABO/RH     Status: None   Collection Time    10/17/12  3:50 PM      Result Value Range   ABO/RH(D) O POS      Dg Chest 1 View  10/17/2012  *RADIOLOGY REPORT*  Clinical Data: Larey Seat.  Left hip pain.  CHEST - 1 VIEW  Comparison: 02/18/2007.  Findings: The cardiac silhouette, mediastinal and hilar contours are normal and stable.  There is mild tortuosity of the thoracic aorta and part due to thoracolumbar scoliosis.  The lungs are clear.  Mild stable emphysematous changes.  No pleural effusion. The bony thorax is intact.  IMPRESSION: No acute cardiopulmonary findings.   Original Report Authenticated By: Rudie Meyer, M.D.    Dg Lumbar Spine 2-3 Views  10/17/2012  *RADIOLOGY REPORT*  Clinical Data: Pain.  Left intertrochanteric hip fracture diagnosed earlier today.  LUMBAR SPINE - 2-3 VIEW  Comparison: CT abdomen/pelvis 10/17/2006.  Findings: Imaging is suboptimal due to the patient's inability to be properly positioned due to pain from previously diagnosed fracture.  Rightward scoliosis of the lumbar spine centered at L3 noted.  Multilevel disc degenerative change is identified, most prominent at L4-L5 and L5-S1.  Aortic calcification noted with stable visualized maximal diameter of 2.0 cm in  anteroposterior dimension, although not optimally imaged on today's exam.  IMPRESSION: Lumbar spine degenerative change.  No compression deformity identified.   Original Report Authenticated By: Christiana Pellant, M.D.    Dg Hip Complete Left  10/17/2012  *RADIOLOGY REPORT*  Clinical Data: Larey Seat.  Left hip pain.  LEFT HIP - COMPLETE 2+ VIEW  Comparison: None  Findings: There is an intertrochanteric fracture of the left hip with mild displacement and a varus deformity.  The pubic symphysis and SI joints are intact.  The right hip appears normal.  IMPRESSION: Mildly displaced intertrochanteric fracture left hip.   Original Report Authenticated By: Rudie Meyer, M.D.    Dg Knee 1-2 Views Left  10/17/2012  *RADIOLOGY REPORT*  Clinical Data: Larey Seat.  Left knee pain.  LEFT KNEE - 1-2 VIEW  Comparison: None  Findings: The joint spaces are maintained.  No acute fracture or joint effusion.  IMPRESSION: No acute bony findings.   Original Report Authenticated By: Rudie Meyer, M.D.     Review of Systems  Constitutional: Negative.   HENT: Negative.   Eyes: Negative.   Respiratory: Negative.   Cardiovascular: Negative.   Gastrointestinal: Negative.   Genitourinary: Negative.   Musculoskeletal: Positive for back pain and joint pain.  Skin: Negative.   Neurological: Negative.   Endo/Heme/Allergies: Negative.   Psychiatric/Behavioral: Negative.    Blood pressure 147/112, pulse 82, temperature 98.1 F (36.7 C), temperature source Oral, resp. rate 19, SpO2 100.00%. Physical Exam  Constitutional: She appears well-developed.  HENT:  Head: Normocephalic.  Eyes: Pupils are equal, round, and reactive to light.  Neck: Normal range of motion.  Cardiovascular: Normal rate.   Respiratory: Effort normal.  GI: Soft.   examination of left lower [place] Rocker he demonstrates palpable pedal pulses good dorsalis and plantar flexion pain with any rotation of the hip knee has no effusion mild swelling is present in the leg and  thigh  Assessment/Plan: Impression is left hip intertrochanteric fracture in a mature female who has a basement at home in a 42 year old son who is able to help her. Plan is for  Foley catheter tonight oral and IV pain medicine n.p.o. after 9 AM tomorrow with plan on adding patient on has surgical case for tomorrow afternoon after 3:00. Risk and benefits of surgical intervention discussed with the patient including but not limited to infection incomplete healing. Anticipate to be a hospital stay potentially through the weekend with discharge to home with home health service touchdown to partial weightbearing depending on bone quality all questions answered medical decision making complicated by decision for surgery  Shaquana Buel SCOTT 10/17/2012, 10:07 PM

## 2012-10-17 NOTE — ED Notes (Signed)
Pt return from xray.

## 2012-10-17 NOTE — H&P (Addendum)
History and Physical       Hospital Admission Note Date: 10/17/2012  Patient name: Amy Hamilton Medical record number: [id number] Date of birth: Sep 09, 1950 Age: 62 y.o. Gender: female PCP: Kaleen Mask, MD    Chief Complaint:  Left hip pain   HPI: Patient is a 62 year old female with past medical problems of hypothyroidism and depression presented to ED that the left hip pain. History is obtained from the patient who stated that she was making jello jigglers for the grand-kids when it spilled on the floor. In the process of cleaning the Jello, she slipped on the floor and fell on her right hip. She thinks she may have hit her head on the refrigerator. There was no dizziness lightheadedness or any syncopal episode. She otherwise lives at home with her son and is very functional with her ADLs.  Multiple x-rays in the ED showed a mildly displaced left intertrochanteric fracture of the left hip. During examination patient also reported low back pain similar to her previous back pain when she had the back surgery in 2008.    Review of Systems:  Constitutional: Denies fever, chills, diaphoresis, appetite change and fatigue.  HEENT: Denies photophobia, eye pain, redness, hearing loss, ear pain, congestion, sore throat, rhinorrhea, sneezing, mouth sores, trouble swallowing, neck pain, neck stiffness and tinnitus.   Respiratory: Denies SOB, DOE, cough, chest tightness,  and wheezing.   Cardiovascular: Denies chest pain, palpitations and leg swelling.  Gastrointestinal: Denies nausea, vomiting, abdominal pain, diarrhea, constipation, blood in stool and abdominal distention.  Genitourinary: Denies dysuria, urgency, frequency, hematuria, flank pain and difficulty urinating.  Musculoskeletal: Please see history of present illness  Skin: Denies pallor, rash and wound.  Neurological: Denies dizziness, seizures, syncope, weakness,  light-headedness, numbness and headaches.  Hematological: Denies adenopathy. Easy bruising, personal or family bleeding history  Psychiatric/Behavioral: Denies suicidal ideation, mood changes, confusion, nervousness, sleep disturbance and agitation  Past Medical History: Past Medical History  Diagnosis Date  . Thyroid disease   . Depression    History reviewed. No pertinent past surgical history.  Medications: Prior to Admission medications   Medication Sig Start Date End Date Taking? Authorizing Provider  ACAI BERRY PO Take 2 tablets by mouth 2 (two) times daily. For GI clense   Yes Historical Provider, MD  FLUoxetine (PROZAC) 40 MG capsule Take 80 mg by mouth daily.   Yes Historical Provider, MD  HYDROcodone-acetaminophen (NORCO) 7.5-325 MG per tablet Take 1-2 tablets by mouth every 6 (six) hours as needed for pain (not exceed 6 tablets in 24 hour period).   Yes Historical Provider, MD  levothyroxine (SYNTHROID, LEVOTHROID) 25 MCG tablet Take 25 mcg by mouth daily.   Yes Historical Provider, MD  lisdexamfetamine (VYVANSE) 70 MG capsule Take 70 mg by mouth every morning.   Yes Historical Provider, MD  Melatonin 5 MG SUBL Place 10 tablets under the tongue daily as needed (for sleep).   Yes Historical Provider, MD  Multiple Vitamins-Minerals (ALIVE WOMENS 50+) TABS Take 1 tablet by mouth daily.   Yes Historical Provider, MD  neomycin-bacitracin-polymyxin (NEOSPORIN) ointment Apply 1 application topically every 12 (twelve) hours. For nose   Yes Historical Provider, MD    Allergies:  No Known Allergies  Social History: She does smoke cigarettes 4-5/day, does not drink any alcohol or use any drugs. She currently lives at home with her son and is functional with her ADLs.  Family History: History reviewed. No pertinent family history.  Physical Exam: Blood pressure 144/84, pulse  74, temperature 98.7 F (37.1 C), temperature source Oral, resp. rate 20, SpO2 100.00%. General: Alert,  awake, oriented x3, in no acute distress. HEENT: normocephalic, atraumatic, anicteric sclera, pink conjunctiva, pupils equal and reactive to light and accomodation, oropharynx clear Neck: supple, no masses or lymphadenopathy, no goiter, no bruits  Heart: Regular rate and rhythm, without murmurs, rubs or gallops. Lungs: Clear to auscultation bilaterally, no wheezing, rales or rhonchi. Abdomen: Soft, nontender, nondistended, positive bowel sounds, no masses. Extremities: No clubbing, cyanosis or edema with positive pedal pulses. Left hip is flexed and pain with movement, mild tenderness on the left knee Neuro: Grossly intact, no focal neurological deficits, right leg 5/ 5 strength, left leg limited by pain Psych: alert and oriented x 3, normal mood and affect Skin: no rashes or lesions, warm and dry   LABS on Admission:  Basic Metabolic Panel:  Recent Labs Lab 10/17/12 1540  NA 134*  K 3.9  CL 99  CO2 27  GLUCOSE 94  BUN 13  CREATININE 0.60  CALCIUM 8.9   Liver Function Tests: No results found for this basename: AST, ALT, ALKPHOS, BILITOT, PROT, ALBUMIN,  in the last 168 hours No results found for this basename: LIPASE, AMYLASE,  in the last 168 hours No results found for this basename: AMMONIA,  in the last 168 hours CBC:  Recent Labs Lab 10/17/12 1540  WBC 7.2  NEUTROABS 4.7  HGB 11.1*  HCT 32.0*  MCV 89.6  PLT 312     Radiological Exams on Admission: Dg Chest 1 View  10/17/2012  *RADIOLOGY REPORT*  Clinical Data: Larey Seat.  Left hip pain.  CHEST - 1 VIEW  Comparison: 02/18/2007.  Findings: The cardiac silhouette, mediastinal and hilar contours are normal and stable.  There is mild tortuosity of the thoracic aorta and part due to thoracolumbar scoliosis.  The lungs are clear.  Mild stable emphysematous changes.  No pleural effusion. The bony thorax is intact.  IMPRESSION: No acute cardiopulmonary findings.   Original Report Authenticated By: Rudie Meyer, M.D.    Dg Hip  Complete Left  10/17/2012  *RADIOLOGY REPORT*  Clinical Data: Larey Seat.  Left hip pain.  LEFT HIP - COMPLETE 2+ VIEW  Comparison: None  Findings: There is an intertrochanteric fracture of the left hip with mild displacement and a varus deformity.  The pubic symphysis and SI joints are intact.  The right hip appears normal.  IMPRESSION: Mildly displaced intertrochanteric fracture left hip.   Original Report Authenticated By: Rudie Meyer, M.D.    Dg Knee 1-2 Views Left  10/17/2012  *RADIOLOGY REPORT*  Clinical Data: Larey Seat.  Left knee pain.  LEFT KNEE - 1-2 VIEW  Comparison: None  Findings: The joint spaces are maintained.  No acute fracture or joint effusion.  IMPRESSION: No acute bony findings.   Original Report Authenticated By: Rudie Meyer, M.D.     Assessment/Plan Principal Problem:   Hip fracture, left with mechanical fall - Admit to MedSurg, will follow hip fracture pathway  - Per EDP, orthopedics has been consulted and Dr August Saucer will evaluate patient - Keep n.p.o. for possible surgery tonight - Patient is medically stable for the surgery, she is otherwise very functional with ADLs, has no cardiac symptoms, did not have a syncopal episode.  - Continue pain control, DVT prophylaxis per ortho  Active Problems: Back pain - I will obtain lumbar spine x-rays to rule out any compression fracture    Hypothyroidism - Currently n.p.o., will restart Synthroid in am  Depression - Restart Prozac when taking PO  DVT prophylaxis: Currently SCDs   CODE STATUS: Full code   Further plan will depend as patient's clinical course evolves and further radiologic and laboratory data become available.   Time Spent on Admission: 1 hour  Maddox Hlavaty M.D. [facility] 10/17/2012, 7:00 PM Pager: [phone]  If 7PM-7AM, please contact night-coverage [email or web address] Password [email or web address]  Addendum Please note in the history of present illness section, I have by error dictated as 'fell on her right  hip", it was left hip and patient suffered a left hip fracture.    Churchill Grimsley M.D. [facility] 10/22/2012, 12:50 PM  Pager: [phone]

## 2012-10-17 NOTE — ED Notes (Signed)
Report given to donna, rn.  Pt transported to 5n via stretcher.

## 2012-10-18 ENCOUNTER — Encounter (HOSPITAL_COMMUNITY): Payer: Self-pay | Admitting: *Deleted

## 2012-10-18 ENCOUNTER — Inpatient Hospital Stay (HOSPITAL_COMMUNITY): Payer: Medicaid Other

## 2012-10-18 ENCOUNTER — Inpatient Hospital Stay (HOSPITAL_COMMUNITY): Payer: Medicaid Other | Admitting: *Deleted

## 2012-10-18 ENCOUNTER — Encounter (HOSPITAL_COMMUNITY): Payer: Self-pay | Admitting: Anesthesiology

## 2012-10-18 ENCOUNTER — Encounter (HOSPITAL_COMMUNITY)
Admission: EM | Disposition: A | Discharge status: Rehabilitation Facility | Payer: Self-pay | Source: Home / Self Care | Attending: Internal Medicine

## 2012-10-18 HISTORY — PX: FEMUR IM NAIL: SHX1597

## 2012-10-18 LAB — CBC
HCT: 32.1 % — ABNORMAL LOW (ref 36.0–46.0)
MCH: 31.2 pg (ref 26.0–34.0)
MCV: 90.2 fL (ref 78.0–100.0)
Platelets: 291 10*3/uL (ref 150–400)
RBC: 3.56 MIL/uL — ABNORMAL LOW (ref 3.87–5.11)
RDW: 12.2 % (ref 11.5–15.5)

## 2012-10-18 LAB — BASIC METABOLIC PANEL
BUN: 9 mg/dL (ref 6–23)
CO2: 26 mEq/L (ref 19–32)
Calcium: 8.5 mg/dL (ref 8.4–10.5)
Creatinine, Ser: 0.54 mg/dL (ref 0.50–1.10)

## 2012-10-18 LAB — SURGICAL PCR SCREEN: MRSA, PCR: NEGATIVE

## 2012-10-18 SURGERY — INSERTION, INTRAMEDULLARY ROD, FEMUR
Anesthesia: General | Site: Hip | Laterality: Left | Wound class: Clean

## 2012-10-18 MED ORDER — ONDANSETRON HCL 4 MG PO TABS: 4.0000 mg | ORAL_TABLET | Freq: Four times a day (QID) | ORAL | Status: DC | PRN

## 2012-10-18 MED ORDER — LACTATED RINGERS IV SOLN
INTRAVENOUS | Status: DC
  Administered 2012-10-18: 16:00:00 via INTRAVENOUS

## 2012-10-18 MED ORDER — POTASSIUM CHLORIDE IN NACL 20-0.9 MEQ/L-% IV SOLN
INTRAVENOUS | Status: AC
  Administered 2012-10-18: 23:00:00 via INTRAVENOUS
  Filled 2012-10-18 (×4): qty 1000

## 2012-10-18 MED ORDER — METHOCARBAMOL 500 MG PO TABS
500.0000 mg | ORAL_TABLET | Freq: Four times a day (QID) | ORAL | Status: DC | PRN
  Administered 2012-10-18 – 2012-10-22 (×9): 500 mg via ORAL
  Filled 2012-10-18 (×9): qty 1

## 2012-10-18 MED ORDER — LACTATED RINGERS IV SOLN
INTRAVENOUS | Status: DC | PRN
  Administered 2012-10-18 (×2): via INTRAVENOUS

## 2012-10-18 MED ORDER — METHOCARBAMOL 100 MG/ML IJ SOLN
500.0000 mg | Freq: Four times a day (QID) | INTRAVENOUS | Status: DC | PRN
  Filled 2012-10-18: qty 5

## 2012-10-18 MED ORDER — FLUOXETINE HCL 20 MG PO CAPS
40.0000 mg | ORAL_CAPSULE | Freq: Every day | ORAL | Status: DC
  Administered 2012-10-18 – 2012-10-22 (×5): 40 mg via ORAL
  Filled 2012-10-18 (×5): qty 2

## 2012-10-18 MED ORDER — PHENOL 1.4 % MT LIQD
1.0000 | OROMUCOSAL | Status: DC | PRN
  Administered 2012-10-20: 1 via OROMUCOSAL
  Filled 2012-10-18: qty 177

## 2012-10-18 MED ORDER — METOCLOPRAMIDE HCL 5 MG/ML IJ SOLN
5.0000 mg | Freq: Three times a day (TID) | INTRAMUSCULAR | Status: DC | PRN
  Administered 2012-10-19: 10 mg via INTRAVENOUS
  Filled 2012-10-18: qty 2

## 2012-10-18 MED ORDER — ONDANSETRON HCL 4 MG/2ML IJ SOLN: 4.0000 mg | Freq: Once | INTRAMUSCULAR | Status: DC | PRN

## 2012-10-18 MED ORDER — LEVOTHYROXINE SODIUM 25 MCG PO TABS
25.0000 ug | ORAL_TABLET | Freq: Every day | ORAL | Status: DC
  Administered 2012-10-19 – 2012-10-22 (×4): 25 ug via ORAL
  Filled 2012-10-18 (×6): qty 1

## 2012-10-18 MED ORDER — ASPIRIN EC 325 MG PO TBEC
325.0000 mg | DELAYED_RELEASE_TABLET | Freq: Every day | ORAL | Status: DC
  Administered 2012-10-19 – 2012-10-22 (×4): 325 mg via ORAL
  Filled 2012-10-18 (×6): qty 1

## 2012-10-18 MED ORDER — ADULT MULTIVITAMIN W/MINERALS CH
1.0000 | ORAL_TABLET | Freq: Every day | ORAL | Status: DC
  Administered 2012-10-19 – 2012-10-22 (×5): 1 via ORAL
  Filled 2012-10-18 (×6): qty 1

## 2012-10-18 MED ORDER — 0.9 % SODIUM CHLORIDE (POUR BTL) OPTIME
TOPICAL | Status: DC | PRN
  Administered 2012-10-18: 1000 mL

## 2012-10-18 MED ORDER — HYDROMORPHONE HCL PF 1 MG/ML IJ SOLN
0.2500 mg | INTRAMUSCULAR | Status: DC | PRN
  Administered 2012-10-18 (×4): 0.5 mg via INTRAVENOUS

## 2012-10-18 MED ORDER — ONDANSETRON HCL 4 MG/2ML IJ SOLN: 4.0000 mg | Freq: Four times a day (QID) | INTRAMUSCULAR | Status: DC | PRN

## 2012-10-18 MED ORDER — ACETAMINOPHEN 650 MG RE SUPP: 650.0000 mg | Freq: Four times a day (QID) | RECTAL | Status: DC | PRN

## 2012-10-18 MED ORDER — MORPHINE SULFATE 2 MG/ML IJ SOLN
2.0000 mg | INTRAMUSCULAR | Status: DC | PRN
  Administered 2012-10-18: 2 mg via INTRAVENOUS
  Filled 2012-10-18 (×2): qty 1

## 2012-10-18 MED ORDER — ONDANSETRON HCL 4 MG/2ML IJ SOLN
INTRAMUSCULAR | Status: DC | PRN
  Administered 2012-10-18: 4 mg via INTRAVENOUS

## 2012-10-18 MED ORDER — CEFAZOLIN SODIUM-DEXTROSE 2-3 GM-% IV SOLR
INTRAVENOUS | Status: DC | PRN
  Administered 2012-10-18: 2 g via INTRAVENOUS

## 2012-10-18 MED ORDER — OXYCODONE HCL 5 MG PO TABS
5.0000 mg | ORAL_TABLET | ORAL | Status: DC | PRN
  Administered 2012-10-18: 5 mg via ORAL
  Administered 2012-10-19: 10 mg via ORAL
  Administered 2012-10-19: 5 mg via ORAL
  Administered 2012-10-19 – 2012-10-20 (×3): 10 mg via ORAL
  Administered 2012-10-20: 5 mg via ORAL
  Administered 2012-10-20 – 2012-10-22 (×4): 10 mg via ORAL
  Filled 2012-10-18 (×5): qty 2

## 2012-10-18 MED ORDER — MENTHOL 3 MG MT LOZG: 1.0000 | LOZENGE | OROMUCOSAL | Status: DC | PRN

## 2012-10-18 MED ORDER — FENTANYL CITRATE 0.05 MG/ML IJ SOLN
INTRAMUSCULAR | Status: DC | PRN
  Administered 2012-10-18 (×2): 50 ug via INTRAVENOUS

## 2012-10-18 MED ORDER — PHENYLEPHRINE HCL 10 MG/ML IJ SOLN
INTRAMUSCULAR | Status: DC | PRN
  Administered 2012-10-18 (×2): 80 ug via INTRAVENOUS
  Administered 2012-10-18: 40 ug via INTRAVENOUS
  Administered 2012-10-18 (×2): 80 ug via INTRAVENOUS

## 2012-10-18 MED ORDER — EPHEDRINE SULFATE 50 MG/ML IJ SOLN
INTRAMUSCULAR | Status: DC | PRN
  Administered 2012-10-18 (×2): 10 mg via INTRAVENOUS
  Administered 2012-10-18: 20 mg via INTRAVENOUS
  Administered 2012-10-18 (×2): 5 mg via INTRAVENOUS

## 2012-10-18 MED ORDER — FENTANYL CITRATE 0.05 MG/ML IJ SOLN
100.0000 ug | Freq: Once | INTRAMUSCULAR | Status: AC
  Administered 2012-10-18 (×2): 25 ug via INTRAVENOUS
  Administered 2012-10-18: 50 ug via INTRAVENOUS

## 2012-10-18 MED ORDER — HYDROMORPHONE HCL PF 1 MG/ML IJ SOLN
1.0000 mg | INTRAMUSCULAR | Status: DC | PRN
  Administered 2012-10-18 – 2012-10-21 (×14): 1 mg via INTRAVENOUS
  Filled 2012-10-18 (×13): qty 1

## 2012-10-18 MED ORDER — NEOSTIGMINE METHYLSULFATE 1 MG/ML IJ SOLN
INTRAMUSCULAR | Status: DC | PRN
  Administered 2012-10-18: 2 mg via INTRAVENOUS

## 2012-10-18 MED ORDER — CEFAZOLIN SODIUM-DEXTROSE 2-3 GM-% IV SOLR
2.0000 g | Freq: Three times a day (TID) | INTRAVENOUS | Status: AC
  Administered 2012-10-19 (×2): 2 g via INTRAVENOUS
  Filled 2012-10-18 (×2): qty 50

## 2012-10-18 MED ORDER — HYDROMORPHONE HCL PF 1 MG/ML IJ SOLN
0.1000 mg | INTRAMUSCULAR | Status: DC | PRN
  Filled 2012-10-18: qty 1

## 2012-10-18 MED ORDER — ACETAMINOPHEN 325 MG PO TABS: 650.0000 mg | ORAL_TABLET | Freq: Four times a day (QID) | ORAL | Status: DC | PRN

## 2012-10-18 MED ORDER — HYDROMORPHONE HCL PF 1 MG/ML IJ SOLN
1.0000 mg | INTRAMUSCULAR | Status: DC | PRN
  Administered 2012-10-18 (×2): 1 mg via INTRAVENOUS
  Filled 2012-10-18: qty 1
  Filled 2012-10-18: qty 2
  Filled 2012-10-18: qty 1

## 2012-10-18 MED ORDER — ALIVE WOMENS 50+ PO TABS: 1.0000 | ORAL_TABLET | Freq: Every day | ORAL | Status: DC

## 2012-10-18 MED ORDER — MIDAZOLAM HCL 5 MG/5ML IJ SOLN
INTRAMUSCULAR | Status: DC | PRN
Start: 2012-10-18 — End: 2012-10-18
  Administered 2012-10-18 (×2): 1 mg via INTRAVENOUS

## 2012-10-18 MED ORDER — ADULT MULTIVITAMIN W/MINERALS CH: 1.0000 | ORAL_TABLET | Freq: Every day | ORAL | Status: DC

## 2012-10-18 MED ORDER — LIDOCAINE HCL (CARDIAC) 20 MG/ML IV SOLN
INTRAVENOUS | Status: DC | PRN
  Administered 2012-10-18: 80 mg via INTRAVENOUS

## 2012-10-18 MED ORDER — METOCLOPRAMIDE HCL 10 MG PO TABS: 5.0000 mg | ORAL_TABLET | Freq: Three times a day (TID) | ORAL | Status: DC | PRN

## 2012-10-18 MED ORDER — ROCURONIUM BROMIDE 100 MG/10ML IV SOLN
INTRAVENOUS | Status: DC | PRN
  Administered 2012-10-18: 30 mg via INTRAVENOUS

## 2012-10-18 MED ORDER — PROPOFOL 10 MG/ML IV BOLUS
INTRAVENOUS | Status: DC | PRN
  Administered 2012-10-18: 100 mg via INTRAVENOUS

## 2012-10-18 MED ORDER — LISDEXAMFETAMINE DIMESYLATE 70 MG PO CAPS
70.0000 mg | ORAL_CAPSULE | ORAL | Status: DC
  Administered 2012-10-19 – 2012-10-22 (×4): 70 mg via ORAL
  Filled 2012-10-18 (×4): qty 1

## 2012-10-18 MED ORDER — HYDROMORPHONE HCL PF 1 MG/ML IJ SOLN
2.0000 mg | Freq: Once | INTRAMUSCULAR | Status: AC
  Administered 2012-10-18: 2 mg via INTRAVENOUS

## 2012-10-18 MED ORDER — CEFAZOLIN SODIUM-DEXTROSE 2-3 GM-% IV SOLR
2.0000 g | Freq: Once | INTRAVENOUS | Status: AC
  Administered 2012-10-18: 2 g via INTRAVENOUS
  Filled 2012-10-18: qty 50

## 2012-10-18 MED ORDER — GLYCOPYRROLATE 0.2 MG/ML IJ SOLN
INTRAMUSCULAR | Status: DC | PRN
  Administered 2012-10-18: 0.3 mg via INTRAVENOUS

## 2012-10-18 SURGICAL SUPPLY — 53 items
BLADE SURG 15 STRL LF DISP TIS (BLADE) IMPLANT
BLADE SURG 15 STRL SS (BLADE)
BLADE SURG ROTATE 9660 (MISCELLANEOUS) IMPLANT
CLOTH BEACON ORANGE TIMEOUT ST (SAFETY) ×2 IMPLANT
COVER SURGICAL LIGHT HANDLE (MISCELLANEOUS) ×2 IMPLANT
DRAPE ORTHO SPLIT 77X108 STRL (DRAPES)
DRAPE PROXIMA HALF (DRAPES) IMPLANT
DRAPE STERI IOBAN 125X83 (DRAPES) ×2 IMPLANT
DRAPE SURG ORHT 6 SPLT 77X108 (DRAPES) IMPLANT
DRSG MEPILEX BORDER 4X4 (GAUZE/BANDAGES/DRESSINGS) ×4 IMPLANT
DRSG MEPILEX BORDER 4X8 (GAUZE/BANDAGES/DRESSINGS) IMPLANT
DURAPREP 26ML APPLICATOR (WOUND CARE) ×2 IMPLANT
ELECT REM PT RETURN 9FT ADLT (ELECTROSURGICAL) ×2
ELECTRODE REM PT RTRN 9FT ADLT (ELECTROSURGICAL) ×1 IMPLANT
FACESHIELD LNG OPTICON STERILE (SAFETY) IMPLANT
GAUZE XEROFORM 1X8 LF (GAUZE/BANDAGES/DRESSINGS) ×2 IMPLANT
GAUZE XEROFORM 5X9 LF (GAUZE/BANDAGES/DRESSINGS) IMPLANT
GLOVE BIOGEL PI IND STRL 8 (GLOVE) ×1 IMPLANT
GLOVE BIOGEL PI INDICATOR 8 (GLOVE) ×1
GLOVE SURG ORTHO 8.0 STRL STRW (GLOVE) ×2 IMPLANT
GOWN PREVENTION PLUS LG XLONG (DISPOSABLE) IMPLANT
GOWN PREVENTION PLUS XLARGE (GOWN DISPOSABLE) ×2 IMPLANT
GOWN STRL NON-REIN LRG LVL3 (GOWN DISPOSABLE) ×2 IMPLANT
GUIDE PIN 3.2 LONG (PIN) ×2 IMPLANT
GUIDE PIN 3.2MM (MISCELLANEOUS) ×2
GUIDE PIN ORTH 343X3.2XBRAD (MISCELLANEOUS) ×2 IMPLANT
HIP SCREW SET (Screw) ×2 IMPLANT
KIT BASIN OR (CUSTOM PROCEDURE TRAY) ×2 IMPLANT
KIT ROOM TURNOVER OR (KITS) ×2 IMPLANT
MANIFOLD NEPTUNE II (INSTRUMENTS) ×2 IMPLANT
NAIL IMSH 10X36 (Nail) ×2 IMPLANT
NS IRRIG 1000ML POUR BTL (IV SOLUTION) ×2 IMPLANT
PACK GENERAL/GYN (CUSTOM PROCEDURE TRAY) ×2 IMPLANT
PAD ARMBOARD 7.5X6 YLW CONV (MISCELLANEOUS) ×4 IMPLANT
SCREW COMPRESSION (Screw) ×2 IMPLANT
SCREW LAG 80 (Screw) ×1 IMPLANT
SCREW LAG 80MM (Screw) ×2 IMPLANT
SCREW LAG 85MM (Screw) ×1 IMPLANT
SCREW LAGSTD 85X21X12.7X9 (Screw) ×1 IMPLANT
SPONGE LAP 4X18 X RAY DECT (DISPOSABLE) IMPLANT
STAPLER VISISTAT 35W (STAPLE) ×2 IMPLANT
SUT ETHILON 2 0 FS 18 (SUTURE) IMPLANT
SUT VIC AB 0 CT1 27 (SUTURE) ×1
SUT VIC AB 0 CT1 27XBRD ANBCTR (SUTURE) ×1 IMPLANT
SUT VIC AB 0 CT2 27 (SUTURE) ×2 IMPLANT
SUT VIC AB 2-0 CT1 27 (SUTURE) ×2
SUT VIC AB 2-0 CT1 TAPERPNT 27 (SUTURE) ×2 IMPLANT
SUT VIC AB 2-0 CTB1 (SUTURE) IMPLANT
SUT VIC AB 2-0 FS1 27 (SUTURE) ×2 IMPLANT
TAPE STRIPS DRAPE STRL (GAUZE/BANDAGES/DRESSINGS) IMPLANT
TOWEL OR 17X24 6PK STRL BLUE (TOWEL DISPOSABLE) ×2 IMPLANT
TOWEL OR 17X26 10 PK STRL BLUE (TOWEL DISPOSABLE) ×2 IMPLANT
WATER STERILE IRR 1000ML POUR (IV SOLUTION) IMPLANT

## 2012-10-18 NOTE — Progress Notes (Signed)
Dr. Gelene Mink called regarding pain stated may continue pain medication

## 2012-10-18 NOTE — Anesthesia Preprocedure Evaluation (Addendum)
Anesthesia Evaluation  Patient identified by MRN, date of birth, ID band Patient awake    Reviewed: Allergy & Precautions, H&P , NPO status , Patient's Chart, lab work & pertinent test results, reviewed documented beta blocker date and time   Airway Mallampati: I TM Distance: >3 FB Neck ROM: full    Dental  (+) Edentulous Upper and Edentulous Lower   Pulmonary          Cardiovascular Rhythm:regular Rate:Normal     Neuro/Psych    GI/Hepatic   Endo/Other    Renal/GU      Musculoskeletal   Abdominal   Peds  Hematology   Anesthesia Other Findings   Reproductive/Obstetrics                          Anesthesia Physical Anesthesia Plan  ASA: I  Anesthesia Plan: General   Post-op Pain Management:    Induction: Intravenous  Airway Management Planned: Oral ETT  Additional Equipment:   Intra-op Plan:   Post-operative Plan: Extubation in OR  Informed Consent: I have reviewed the patients History and Physical, chart, labs and discussed the procedure including the risks, benefits and alternatives for the proposed anesthesia with the patient or authorized representative who has indicated his/her understanding and acceptance.     Plan Discussed with: CRNA, Anesthesiologist and Surgeon  Anesthesia Plan Comments:         Anesthesia Quick Evaluation

## 2012-10-18 NOTE — Progress Notes (Signed)
Patient ID: Amy Hamilton  female  YNW:[id number]    DOB: [birth year]    DOA: 10/17/2012  PCP: Kaleen Mask, MD  Assessment/Plan: Principal Problem:   Hip fracture, left secondary to mechanical fall - n.p.o. For surgery today at 3 PM - Management per orthopedics  Active Problems:    Hypothyroidism - continue Synthroid    Depression - stable, Will place back on Prozac once tolerating oral    Back pain - Lumbar spine x-ray was negative for any compression fracture  DVT Prophylaxis:  Code Status:  Disposition:    Subjective: Tearful with pain, awaiting surgery  Objective: Weight change:   Intake/Output Summary (Last 24 hours) at 10/18/12 1258 Last data filed at 10/18/12 0900  Gross per 24 hour  Intake    240 ml  Output   2001 ml  Net  -1761 ml   Blood pressure 98/62, pulse 71, temperature 97.7 F (36.5 C), temperature source Oral, resp. rate 18, SpO2 98.00%.  Physical Exam: General: Alert and awake, oriented x3, not in any acute distress. CVS: S1-S2 clear, no murmur rubs or gallops Chest: clear to auscultation bilaterally, no wheezing, rales or rhonchi Abdomen: soft nontender, nondistended, normal bowel sounds Extremities: no cyanosis, clubbing or edema noted bilaterally   Lab Results: Basic Metabolic Panel:  Recent Labs Lab 10/17/12 1540 10/18/12 0557  NA 134* 134*  K 3.9 3.6  CL 99 99  CO2 27 26  GLUCOSE 94 109*  BUN 13 9  CREATININE 0.60 0.54  CALCIUM 8.9 8.5   Liver Function Tests:  Recent Labs Lab 10/17/12 2137  ALBUMIN 3.3*   No results found for this basename: LIPASE, AMYLASE,  in the last 168 hours No results found for this basename: AMMONIA,  in the last 168 hours CBC:  Recent Labs Lab 10/17/12 1540 10/18/12 0557  WBC 7.2 10.5  NEUTROABS 4.7  --   HGB 11.1* 11.1*  HCT 32.0* 32.1*  MCV 89.6 90.2  PLT 312 291   Cardiac Enzymes: No results found for this basename: CKTOTAL, CKMB, CKMBINDEX, TROPONINI,  in the last 168  hours BNP: No components found with this basename: POCBNP,  CBG: No results found for this basename: GLUCAP,  in the last 168 hours   Micro Results: Recent Results (from the past 240 hour(s))  SURGICAL PCR SCREEN     Status: None   Collection Time    10/18/12  3:04 AM      Result Value Range Status   MRSA, PCR NEGATIVE  NEGATIVE Final   Staphylococcus aureus NEGATIVE  NEGATIVE Final   Comment:            The Xpert SA Assay (FDA     approved for NASAL specimens     in patients over 75 years of age),     is one component of     a comprehensive surveillance     program.  Test performance has     been validated by [employer] for patients greater     than or equal to 25 year old.     It is not intended     to diagnose infection nor to     guide or monitor treatment.    Studies/Results: Dg Chest 1 View  10/17/2012  *RADIOLOGY REPORT*  Clinical Data: Larey Seat.  Left hip pain.  CHEST - 1 VIEW  Comparison: 02/18/2007.  Findings: The cardiac silhouette, mediastinal and hilar contours are normal and stable.  There is  mild tortuosity of the thoracic aorta and part due to thoracolumbar scoliosis.  The lungs are clear.  Mild stable emphysematous changes.  No pleural effusion. The bony thorax is intact.  IMPRESSION: No acute cardiopulmonary findings.   Original Report Authenticated By: Rudie Meyer, M.D.    Dg Lumbar Spine 2-3 Views  10/17/2012  *RADIOLOGY REPORT*  Clinical Data: Pain.  Left intertrochanteric hip fracture diagnosed earlier today.  LUMBAR SPINE - 2-3 VIEW  Comparison: CT abdomen/pelvis 10/17/2006.  Findings: Imaging is suboptimal due to the patient's inability to be properly positioned due to pain from previously diagnosed fracture.  Rightward scoliosis of the lumbar spine centered at L3 noted.  Multilevel disc degenerative change is identified, most prominent at L4-L5 and L5-S1.  Aortic calcification noted with stable visualized maximal diameter of 2.0 cm in anteroposterior  dimension, although not optimally imaged on today's exam.  IMPRESSION: Lumbar spine degenerative change.  No compression deformity identified.   Original Report Authenticated By: Christiana Pellant, M.D.    Dg Hip Complete Left  10/17/2012  *RADIOLOGY REPORT*  Clinical Data: Larey Seat.  Left hip pain.  LEFT HIP - COMPLETE 2+ VIEW  Comparison: None  Findings: There is an intertrochanteric fracture of the left hip with mild displacement and a varus deformity.  The pubic symphysis and SI joints are intact.  The right hip appears normal.  IMPRESSION: Mildly displaced intertrochanteric fracture left hip.   Original Report Authenticated By: Rudie Meyer, M.D.    Dg Knee 1-2 Views Left  10/17/2012  *RADIOLOGY REPORT*  Clinical Data: Larey Seat.  Left knee pain.  LEFT KNEE - 1-2 VIEW  Comparison: None  Findings: The joint spaces are maintained.  No acute fracture or joint effusion.  IMPRESSION: No acute bony findings.   Original Report Authenticated By: Rudie Meyer, M.D.     Medications: Scheduled Meds: . docusate sodium  100 mg Oral BID      LOS: 1 day   Shameca Landen M.D. [facility] 10/18/2012, 12:58 PM Pager: [phone]  If 7PM-7AM, please contact night-coverage [email or web address] Password [email or web address]

## 2012-10-18 NOTE — Care Management Note (Signed)
CARE MANAGEMENT NOTE 10/18/2012  Patient:  Amy Hamilton, Amy Hamilton   Account Number:  [id number]  Date Initiated:  10/18/2012  Documentation initiated by:  Vance Peper  Subjective/Objective Assessment:   62 yr old female adm for left hip fracture. OR scheduled for today.     Action/Plan:   CM will follow after PT/OT eval patient.       Status of service:  In process, will continue to follow

## 2012-10-18 NOTE — Preoperative (Signed)
Beta Blockers   Reason not to administer Beta Blockers:Not Applicable 

## 2012-10-18 NOTE — Progress Notes (Signed)
Orthopedic Tech Progress Note Patient Details:  Amy Hamilton [birth year] [id number]  Ortho Devices Ortho Device/Splint Location: trapeze bar Ortho Device/Splint Interventions: Application   Cammer, Mickie Bail 10/18/2012, 10:37 AM

## 2012-10-18 NOTE — Transfer of Care (Signed)
Immediate Anesthesia Transfer of Care Note  Patient: Amy Hamilton  Procedure(s) Performed: Procedure(s): INTRAMEDULLARY (IM) NAIL FEMORAL (Left)  Patient Location: PACU  Anesthesia Type:General  Level of Consciousness: sedated  Airway & Oxygen Therapy: Patient Spontanous Breathing and Patient connected to face mask oxygen  Post-op Assessment: Report given to PACU RN and Post -op Vital signs reviewed and stable  Post vital signs: Reviewed and stable  Complications: No apparent anesthesia complications

## 2012-10-18 NOTE — Progress Notes (Signed)
Dr. Gelene Mink stopped by bedside and spoke with patient about receiving more pain medication. Was going to sign patient out

## 2012-10-18 NOTE — Progress Notes (Signed)
UR COMPLETED  

## 2012-10-18 NOTE — Brief Op Note (Signed)
10/18/2012  6:45 PM  PATIENT:  Amy Hamilton  62 y.o. female  PRE-OPERATIVE DIAGNOSIS Intertrochanteric fracture left:    POST-OPERATIVE DIAGNOSIS: Intertrochanteric fracture left   PROCEDURE:  Procedure(s): INTRAMEDULLARY (IM) NAIL FEMORAL  SURGEON:  Surgeon(s): Cammy Copa, MD  ASSISTANT:   ANESTHESIA:   general  EBL: 75 ml    Total I/O In: 1240 [P.O.:240; I.V.:1000] Out: 2061 [Urine:1961; Blood:100]  BLOOD ADMINISTERED: none  DRAINS: none   LOCAL MEDICATIONS USED:  none  SPECIMEN:  No Specimen  COUNTS:  YES  TOURNIQUET:  * No tourniquets in log *  DICTATION: .Other Dictation: Dictation Number [id number]  PLAN OF CARE: Admit to inpatient   PATIENT DISPOSITION:  PACU - hemodynamically stable

## 2012-10-18 NOTE — Anesthesia Postprocedure Evaluation (Signed)
Anesthesia Post Note  Patient: Amy Hamilton  Procedure(s) Performed: Procedure(s) (LRB): INTRAMEDULLARY (IM) NAIL FEMORAL (Left)  Anesthesia type: general  Patient location: PACU  Post pain: Pain level controlled  Post assessment: Patient's Cardiovascular Status Stable  Last Vitals:  Filed Vitals:   10/18/12 1900  BP: 116/76  Pulse: 75  Temp:   Resp: 20    Post vital signs: Reviewed and stable  Level of consciousness: sedated, while at patient's bedside she is resting comfortably and had to be awaken to discuss pain score of 10. Analgesic goals discussed and patient will be discharged to floor with surgeon's analgesic orders in effect.  Complications: No apparent anesthesia complications

## 2012-10-18 NOTE — Progress Notes (Signed)
X ray done

## 2012-10-18 NOTE — Anesthesia Procedure Notes (Signed)
Procedure Name: Intubation Date/Time: 10/18/2012 5:34 PM Performed by: Brien Mates DOBSON Pre-anesthesia Checklist: Patient identified, Emergency Drugs available, Suction available, Patient being monitored and Timeout performed Patient Re-evaluated:Patient Re-evaluated prior to inductionPreoxygenation: Pre-oxygenation with 100% oxygen Intubation Type: IV induction Ventilation: Mask ventilation without difficulty and Oral airway inserted - appropriate to patient size Laryngoscope Size: Miller and 2 Grade View: Grade I Tube type: Oral Tube size: 7.5 mm Number of attempts: 1 Airway Equipment and Method: Stylet Placement Confirmation: ETT inserted through vocal cords under direct vision,  positive ETCO2 and breath sounds checked- equal and bilateral Secured at: 21 cm Tube secured with: Tape Dental Injury: Teeth and Oropharynx as per pre-operative assessment

## 2012-10-19 ENCOUNTER — Encounter (HOSPITAL_COMMUNITY): Payer: Self-pay | Admitting: Orthopedic Surgery

## 2012-10-19 LAB — CBC
HCT: 25.5 % — ABNORMAL LOW (ref 36.0–46.0)
Hemoglobin: 9 g/dL — ABNORMAL LOW (ref 12.0–15.0)
MCH: 32.1 pg (ref 26.0–34.0)
MCHC: 35.3 g/dL (ref 30.0–36.0)
MCV: 91.1 fL (ref 78.0–100.0)
Platelets: 232 10*3/uL (ref 150–400)
RBC: 2.8 MIL/uL — ABNORMAL LOW (ref 3.87–5.11)
RDW: 12.1 % (ref 11.5–15.5)
WBC: 10.3 10*3/uL (ref 4.0–10.5)

## 2012-10-19 LAB — PROTIME-INR
INR: 1.08 (ref 0.00–1.49)
Prothrombin Time: 13.9 seconds (ref 11.6–15.2)

## 2012-10-19 LAB — BASIC METABOLIC PANEL
CO2: 26 mEq/L (ref 19–32)
Chloride: 95 mEq/L — ABNORMAL LOW (ref 96–112)
Sodium: 128 mEq/L — ABNORMAL LOW (ref 135–145)

## 2012-10-19 MED ORDER — POLYETHYLENE GLYCOL 3350 17 G PO PACK
17.0000 g | PACK | Freq: Every day | ORAL | Status: DC
  Administered 2012-10-19 – 2012-10-22 (×4): 17 g via ORAL
  Filled 2012-10-19 (×5): qty 1

## 2012-10-19 MED ORDER — OXYCODONE HCL 10 MG PO TABS: 10.0000 mg | ORAL_TABLET | ORAL | Status: DC | PRN

## 2012-10-19 MED ORDER — BISACODYL 10 MG RE SUPP
10.0000 mg | Freq: Every day | RECTAL | Status: DC | PRN
  Administered 2012-10-21: 10 mg via RECTAL
  Filled 2012-10-19: qty 1

## 2012-10-19 MED ORDER — ASPIRIN 325 MG PO TBEC: 325.0000 mg | DELAYED_RELEASE_TABLET | Freq: Every day | ORAL | Status: DC

## 2012-10-19 MED ORDER — METHOCARBAMOL 500 MG PO TABS: 500.0000 mg | ORAL_TABLET | Freq: Four times a day (QID) | ORAL | Status: DC | PRN

## 2012-10-19 NOTE — Progress Notes (Addendum)
Clinical Social Work Department CLINICAL SOCIAL WORK PLACEMENT NOTE 10/19/2012  Patient:  Amy Hamilton, Amy Hamilton  Account Number:  [id number] Admit date:  10/17/2012  Clinical Social Worker:  Robin Searing  Date/time:  10/19/2012 02:13 PM  Clinical Social Work is seeking post-discharge placement for this patient at the following level of care:   SKILLED NURSING   (*CSW will update this form in Epic as items are completed)   10/19/2012  Patient/family provided with [facility] Department of Clinical Social Work's list of facilities offering this level of care within the geographic area requested by the patient (or if unable, by the patient's family).  10/19/2012  Patient/family informed of their freedom to choose among providers that offer the needed level of care, that participate in Medicare, Medicaid or managed care program needed by the patient, have an available bed and are willing to accept the patient.  10/19/2012  Patient/family informed of [facility]' ownership interest in [facility], as well as of the fact that they are under no obligation to receive care at this facility.  PASARR submitted to EDS on 10/19/2012 PASARR number received from [employer]DS on   FL2 transmitted to all facilities in geographic area requested by pt/family on  10/19/2012 FL2 transmitted to all facilities within larger geographic area on   Patient informed that his/her managed care company has contracts with or will negotiate with  certain facilities, including the following:     Patient/family informed of bed offers received:  10/21/12 [name]) Patient chooses bed at  Physician recommends and patient chooses bed at    Patient to be transferred to  on   Patient to be transferred to facility by   The following physician request were entered in Epic:   Additional Comments:  [name], MSW, Theresia Majors [phone]

## 2012-10-19 NOTE — Consult Note (Signed)
Physical Medicine and Rehabilitation Consult Reason for Consult: Left intertrochanteric hip fracture Referring Physician: [facility]   HPI: Amy Hamilton is a 62 y.o. right-handed female admitted 10/17/2012 after a fall while in the kitchen landing on her left hip without loss of consciousness. X-rays and imaging revealed a displaced left intertrochanteric hip fracture. Underwent intramedullary nail 10/18/2012 per Dr. August Saucer. Advised partial weightbearing left lower extremity. Postoperative anemia 9.0 and monitored. Pain management as directed. Physical therapy evaluation completed 10/19/2012 with recommendations for physical medicine rehabilitation consult to consider inpatient rehabilitation services   Review of Systems  Musculoskeletal: Positive for myalgias and back pain.  Psychiatric/Behavioral: Positive for depression.  All other systems reviewed and are negative.   Past Medical History  Diagnosis Date  . Thyroid disease   . Depression    History reviewed. No pertinent past surgical history. History reviewed. No pertinent family history. Social History:  reports that she does not drink alcohol. Her tobacco and drug histories are not on file. Allergies: No Known Allergies Medications Prior to Admission  Medication Sig Dispense Refill  . ACAI BERRY PO Take 2 tablets by mouth 2 (two) times daily. For GI clense      . FLUoxetine (PROZAC) 40 MG capsule Take 80 mg by mouth daily.      Marland Kitchen HYDROcodone-acetaminophen (NORCO) 7.5-325 MG per tablet Take 1-2 tablets by mouth every 6 (six) hours as needed for pain (not exceed 6 tablets in 24 hour period).      Marland Kitchen levothyroxine (SYNTHROID, LEVOTHROID) 25 MCG tablet Take 25 mcg by mouth daily.      Marland Kitchen lisdexamfetamine (VYVANSE) 70 MG capsule Take 70 mg by mouth every morning.      . Melatonin 5 MG SUBL Place 10 tablets under the tongue daily as needed (for sleep).      . Multiple Vitamins-Minerals (ALIVE WOMENS 50+) TABS Take 1 tablet by mouth daily.       Marland Kitchen neomycin-bacitracin-polymyxin (NEOSPORIN) ointment Apply 1 application topically every 12 (twelve) hours. For nose        Home: Home Living Lives With: Son (35 yr old son with "special problems" per pt.  ) Available Help at Discharge: Family;Available 24 hours/day Type of Home: House Home Access: Stairs to enter [employer] of Steps: 2 Entrance Stairs-Rails: None Home Layout: One level Home Adaptive Equipment: None  Functional History: Prior Function Able to Take Stairs?: Yes Driving: Yes Functional Status:  Mobility: Bed Mobility Bed Mobility: Supine to Sit;Sitting - Scoot to Edge of Bed Supine to Sit: 3: Mod assist Sitting - Scoot to Edge of Bed: 4: Min assist Transfers Transfers: Sit to Stand;Stand to [employer] Transfers Sit to Stand: 3: Mod assist;With upper extremity assist;From bed Stand to Sit: 3: Mod assist;With upper extremity assist;To chair/3-in-1;With armrests Stand Pivot Transfers: 3: Mod assist;With armrests Ambulation/Gait Ambulation/Gait Assistance: Not tested (comment) Stairs: No Wheelchair Mobility Wheelchair Mobility: No  ADL:    Cognition: Cognition Arousal/Alertness: Awake/alert Orientation Level: Oriented X4 Cognition Overall Cognitive Status: Appears within functional limits for tasks assessed/performed Arousal/Alertness: Awake/alert Orientation Level: Appears intact for tasks assessed Behavior During Session: [facility] for tasks performed  Blood pressure 138/68, pulse 87, temperature 98.8 F (37.1 C), temperature source Oral, resp. rate 18, SpO2 97.00%. Physical Exam  Vitals reviewed. Constitutional: She is oriented to person, place, and time. She appears well-developed.  HENT:  Head: Normocephalic.  Eyes: EOM are normal.  Neck: Neck supple. No thyromegaly present.  Cardiovascular: Normal rate and regular rhythm.   Pulmonary/Chest:  Breath sounds normal. No respiratory distress. She has no wheezes.  Abdominal: Soft. Bowel  sounds are normal. She exhibits no distension.  Musculoskeletal:  Left hip tender  Neurological: She is alert and oriented to person, place, and time. She has normal reflexes. She displays normal reflexes. No cranial nerve deficit. She exhibits normal muscle tone. Coordination normal.  Follows full commands. Left hip tender, affecting MMT. No focal motor sensory deficits noted otherwise.   Skin:  Hip incision is dressed  Psychiatric: She has a normal mood and affect. Her behavior is normal. Judgment and thought content normal.    Results for orders placed during the hospital encounter of 10/17/12 (from the past 24 hour(s))  CBC     Status: Abnormal   Collection Time    10/19/12  6:15 AM      Result Value Range   WBC 10.3  4.0 - 10.5 K/uL   RBC 2.80 (*) 3.87 - 5.11 MIL/uL   Hemoglobin 9.0 (*) 12.0 - 15.0 g/dL   HCT 95.6 (*) 21.3 - 08.6 %   MCV 91.1  78.0 - 100.0 fL   MCH 32.1  26.0 - 34.0 pg   MCHC 35.3  30.0 - 36.0 g/dL   RDW 57.8  46.9 - 62.9 %   Platelets 232  150 - 400 K/uL  BASIC METABOLIC PANEL     Status: Abnormal   Collection Time    10/19/12  6:15 AM      Result Value Range   Sodium 128 (*) 135 - 145 mEq/L   Potassium 4.0  3.5 - 5.1 mEq/L   Chloride 95 (*) 96 - 112 mEq/L   CO2 26  19 - 32 mEq/L   Glucose, Bld 120 (*) 70 - 99 mg/dL   BUN 7  6 - 23 mg/dL   Creatinine, Ser 5.28  0.50 - 1.10 mg/dL   Calcium 7.8 (*) 8.4 - 10.5 mg/dL   GFR calc non Af Amer >90  >90 mL/min   GFR calc Af Amer >90  >90 mL/min  PROTIME-INR     Status: None   Collection Time    10/19/12  6:15 AM      Result Value Range   Prothrombin Time 13.9  11.6 - 15.2 seconds   INR 1.08  0.00 - 1.49   Dg Chest 1 View  10/17/2012  *RADIOLOGY REPORT*  Clinical Data: Larey Seat.  Left hip pain.  CHEST - 1 VIEW  Comparison: 02/18/2007.  Findings: The cardiac silhouette, mediastinal and hilar contours are normal and stable.  There is mild tortuosity of the thoracic aorta and part due to thoracolumbar scoliosis.   The lungs are clear.  Mild stable emphysematous changes.  No pleural effusion. The bony thorax is intact.  IMPRESSION: No acute cardiopulmonary findings.   Original Report Authenticated By: Rudie Meyer, M.D.    Dg Lumbar Spine 2-3 Views  10/17/2012  *RADIOLOGY REPORT*  Clinical Data: Pain.  Left intertrochanteric hip fracture diagnosed earlier today.  LUMBAR SPINE - 2-3 VIEW  Comparison: CT abdomen/pelvis 10/17/2006.  Findings: Imaging is suboptimal due to the patient's inability to be properly positioned due to pain from previously diagnosed fracture.  Rightward scoliosis of the lumbar spine centered at L3 noted.  Multilevel disc degenerative change is identified, most prominent at L4-L5 and L5-S1.  Aortic calcification noted with stable visualized maximal diameter of 2.0 cm in anteroposterior dimension, although not optimally imaged on today's exam.  IMPRESSION: Lumbar spine degenerative change.  No compression deformity identified.  Original Report Authenticated By: Christiana Pellant, M.D.    Dg Hip Complete Left  10/17/2012  *RADIOLOGY REPORT*  Clinical Data: Larey Seat.  Left hip pain.  LEFT HIP - COMPLETE 2+ VIEW  Comparison: None  Findings: There is an intertrochanteric fracture of the left hip with mild displacement and a varus deformity.  The pubic symphysis and SI joints are intact.  The right hip appears normal.  IMPRESSION: Mildly displaced intertrochanteric fracture left hip.   Original Report Authenticated By: Rudie Meyer, M.D.    Dg Femur Left  10/18/2012  *RADIOLOGY REPORT*  Clinical Data: Left femoral nail placement.  LEFT FEMUR - 2 VIEW,DG C-ARM 61-120 MIN  Comparison: 10/17/2012  Findings: A series of three fluoroscopic spot images demonstrates an intertrochanteric fracture, a cross-table lateral view of the a hip screw and intramedullary nail traversing this fracture site, and a spot image of the distal intramedullary nail stem.  No complicating features readily apparent.  IMPRESSION:  1.   ORIF of left intertrochanteric fracture, without complicating feature apparent.   Original Report Authenticated By: Gaylyn Rong, M.D.    Dg Knee 1-2 Views Left  10/17/2012  *RADIOLOGY REPORT*  Clinical Data: Larey Seat.  Left knee pain.  LEFT KNEE - 1-2 VIEW  Comparison: None  Findings: The joint spaces are maintained.  No acute fracture or joint effusion.  IMPRESSION: No acute bony findings.   Original Report Authenticated By: Rudie Meyer, M.D.    Dg Pelvis Portable  10/18/2012  *RADIOLOGY REPORT*  Clinical Data: Femur fracture, post internal fixation  PORTABLE PELVIS  Comparison:   the previous day's study  Findings: IM rod and sliding screw transfix the left intertrochanteric fracture.  There is approximately 13 mm distraction of the fracture fragments at the level of the lesser trochanter.  IMPRESSION:  Internal fixation of a left intertrochanteric femur fracture   Original Report Authenticated By: D. Andria Rhein, MD    Dg C-arm 61-120 Min  10/18/2012  *RADIOLOGY REPORT*  Clinical Data: Left femoral nail placement.  LEFT FEMUR - 2 VIEW,DG C-ARM 61-120 MIN  Comparison: 10/17/2012  Findings: A series of three fluoroscopic spot images demonstrates an intertrochanteric fracture, a cross-table lateral view of the a hip screw and intramedullary nail traversing this fracture site, and a spot image of the distal intramedullary nail stem.  No complicating features readily apparent.  IMPRESSION:  1.  ORIF of left intertrochanteric fracture, without complicating feature apparent.   Original Report Authenticated By: Gaylyn Rong, M.D.     Assessment/Plan: Diagnosis: left IT hip fx 1. Does the need for close, 24 hr/day medical supervision in concert with the patient's rehab needs make it unreasonable for this patient to be served in a less intensive setting? Yes 2. Co-Morbidities requiring supervision/potential complications: ABLA, hyponatremia,hypokalemia 3. Due to bladder management, bowel management,  safety, skin/wound care, disease management, medication administration, pain management and patient education, does the patient require 24 hr/day rehab nursing? Yes 4. Does the patient require coordinated care of a physician, rehab nurse, PT (1-2 hrs/day, 5 days/week) and OT (1-2 hrs/day, 5 days/week) to address physical and functional deficits in the context of the above medical diagnosis(es)? Yes Addressing deficits in the following areas: balance, endurance, locomotion, strength, transferring, bowel/bladder control, bathing, dressing, feeding, grooming, toileting and psychosocial support 5. Can the patient actively participate in an intensive therapy program of at least 3 hrs of therapy per day at least 5 days per week? Yes 6. The potential for patient to make measurable gains while on  inpatient rehab is excellent 7. Anticipated functional outcomes upon discharge from inpatient rehab are mod I with PT, mod I with OT, n/a with SLP. 8. Estimated rehab length of stay to reach the above functional goals is: 7-10 days 9. Does the patient have adequate social supports to accommodate these discharge functional goals? Potentially 10. Anticipated D/C setting: Home 11. Anticipated post D/C treatments: HH therapy 12. Overall Rehab/Functional Prognosis: excellent  RECOMMENDATIONS: This patient's condition is appropriate for continued rehabilitative care in the following setting: CIR Patient has agreed to participate in recommended program. Yes Note that insurance prior authorization may be required for reimbursement for recommended care.  Comment:Rehab RN to follow up.   Ranelle Oyster, MD, [place] [facility]     10/19/2012

## 2012-10-19 NOTE — Progress Notes (Signed)
Pt stable - pain controlled - TDWBLLE Home on asa and percocet and robaxin TDWB LLE Fu 2 weeks

## 2012-10-19 NOTE — Evaluation (Signed)
Physical Therapy Evaluation Patient Details Name: Amy Hamilton MRN: [id number] DOB: [birth year] Today's Date: 10/19/2012 Time: 7829-5621 PT Time Calculation (min): 41 min  PT Assessment / Plan / Recommendation Clinical Impression  pt presents with L femur fx s/p IM nail.  pt very painful, but motivated to improve mobility.  pt's son is able to provide some A, but pt will need to be at S level for ADLs at D/C.  pt would beenfit from CIR at D/C to maximize Independence prior to return home.      PT Assessment  Patient needs continued PT services    Follow Up Recommendations  CIR    Does the patient have the potential to tolerate intense rehabilitation      Barriers to Discharge None      Equipment Recommendations  Rolling walker with 5" wheels    Recommendations for Other Services OT consult   Frequency Min 6X/week    Precautions / Restrictions Precautions Precautions: Fall Restrictions Weight Bearing Restrictions: Yes LLE Weight Bearing: Partial weight bearing LLE Partial Weight Bearing Percentage or Pounds: 10%   Pertinent Vitals/Pain Did not rate, but very painful.  Pt premedicated.        Mobility  Bed Mobility Bed Mobility: Supine to Sit;Sitting - Scoot to Edge of Bed Supine to Sit: 3: Mod assist Sitting - Scoot to Edge of Bed: 4: Min assist Details for Bed Mobility Assistance: cues for safe technique and sequencing.   Transfers Transfers: Sit to Stand;Stand to Sit;Stand Pivot Transfers Sit to Stand: 3: Mod assist;With upper extremity assist;From bed Stand to Sit: 3: Mod assist;With upper extremity assist;To chair/3-in-1;With armrests Stand Pivot Transfers: 3: Mod assist;With armrests Details for Transfer Assistance: cues for safe technique and encouragement.  pt very painful with mobility.   Ambulation/Gait Ambulation/Gait Assistance: Not tested (comment) Stairs: No Wheelchair Mobility Wheelchair Mobility: No    Exercises     PT Diagnosis: Difficulty  walking;Acute pain  PT Problem List: Decreased strength;Decreased range of motion;Decreased activity tolerance;Decreased balance;Decreased mobility;Decreased knowledge of use of DME;Pain PT Treatment Interventions: DME instruction;Gait training;Stair training;Functional mobility training;Therapeutic activities;Therapeutic exercise;Balance training;Patient/family education   PT Goals Acute Rehab PT Goals PT Goal Formulation: With patient Time For Goal Achievement: 11/02/12 Potential to Achieve Goals: Good Pt will go Supine/Side to Sit: with modified independence PT Goal: Supine/Side to Sit - Progress: Goal set today Pt will go Sit to Supine/Side: with modified independence PT Goal: Sit to Supine/Side - Progress: Goal set today Pt will go Sit to Stand: with modified independence PT Goal: Sit to Stand - Progress: Goal set today Pt will go Stand to Sit: with modified independence PT Goal: Stand to Sit - Progress: Goal set today Pt will Ambulate: 51 - 150 feet;with modified independence;with rolling walker PT Goal: Ambulate - Progress: Goal set today Pt will Go Up / Down Stairs: 1-2 stairs;with min assist;with least restrictive assistive device PT Goal: Up/Down Stairs - Progress: Goal set today  Visit Information  Last PT Received On: 10/19/12 Assistance Needed: +2 (If attempting ambulation)    Subjective Data  Subjective: It's just so painful.   Patient Stated Goal: Home with dogs   Prior Functioning  Home Living Lives With: Son (21 yr old son with "special problems" per pt.  ) Available Help at Discharge: Family;Available 24 hours/day Type of Home: House Home Access: Stairs to enter [employer] of Steps: 2 Entrance Stairs-Rails: None Home Layout: One level Home Adaptive Equipment: None Prior Function Level of Independence: Independent Able  to Take Stairs?: Yes Driving: Yes Communication Communication: No difficulties    Cognition  Cognition Overall Cognitive  Status: Appears within functional limits for tasks assessed/performed Arousal/Alertness: Awake/alert Orientation Level: Appears intact for tasks assessed Behavior During Session: [facility] for tasks performed    Extremity/Trunk Assessment Right Lower Extremity Assessment RLE ROM/Strength/Tone: [facility] for tasks assessed RLE Sensation: WFL - Light Touch Left Lower Extremity Assessment LLE ROM/Strength/Tone: Deficits;Unable to fully assess;Due to pain LLE ROM/Strength/Tone Deficits: pt very painful, but able to A with movement.   LLE Sensation: WFL - Light Touch Trunk Assessment Trunk Assessment: Normal   Balance Balance Balance Assessed: No  End of Session PT - End of Session Equipment Utilized During Treatment: Gait belt Activity Tolerance: Patient limited by pain Patient left: in chair;with call bell/phone within reach Nurse Communication: Mobility status  GP     Sunny Schlein, [place] [phone] 10/19/2012, 9:45 AM

## 2012-10-19 NOTE — Progress Notes (Signed)
Patient ID: Amy Hamilton  female  ZOX:[id number]    DOB: [birth year]    DOA: 10/17/2012  PCP: Kaleen Mask, MD  Assessment/Plan: Principal Problem:   Hip fracture, left secondary to mechanical fall: s/p intramedullary nail, postop day 1 - Start physical therapy today - continue pain control - placed CIR consult, patient was functional with her ADLs prior to the admission and will likely be a good rehab candidate - per ortho, ASA for DVT prophylaxis   Active Problems:    Hypothyroidism - continue Synthroid    Depression - stable, on Prozac     Back pain - Lumbar spine x-ray was negative for any compression fracture  DVT Prophylaxis: SCD  Code Status:  Disposition: await CIR     Subjective: Anxious and complaining of pain  Objective: Weight change:   Intake/Output Summary (Last 24 hours) at 10/19/12 1154 Last data filed at 10/19/12 0943  Gross per 24 hour  Intake   2190 ml  Output   2610 ml  Net   -420 ml   Blood pressure 138/68, pulse 87, temperature 98.8 F (37.1 C), temperature source Oral, resp. rate 18, SpO2 97.00%.  Physical Exam: General: A xO x3 anxious CVS: S1-S2 clear, no murmur rubs or gallops Chest: CTAB Abdomen: soft NT, ND, NBS Ext:  no c/ c /e bilaterally   Lab Results: Basic Metabolic Panel:  Recent Labs Lab 10/18/12 0557 10/19/12 0615  NA 134* 128*  K 3.6 4.0  CL 99 95*  CO2 26 26  GLUCOSE 109* 120*  BUN 9 7  CREATININE 0.54 0.54  CALCIUM 8.5 7.8*   Liver Function Tests:  Recent Labs Lab 10/17/12 2137  ALBUMIN 3.3*   No results found for this basename: LIPASE, AMYLASE,  in the last 168 hours No results found for this basename: AMMONIA,  in the last 168 hours CBC:  Recent Labs Lab 10/17/12 1540 10/18/12 0557 10/19/12 0615  WBC 7.2 10.5 10.3  NEUTROABS 4.7  --   --   HGB 11.1* 11.1* 9.0*  HCT 32.0* 32.1* 25.5*  MCV 89.6 90.2 91.1  PLT 312 291 232   Cardiac Enzymes: No results found for this basename:  CKTOTAL, CKMB, CKMBINDEX, TROPONINI,  in the last 168 hours BNP: No components found with this basename: POCBNP,  CBG: No results found for this basename: GLUCAP,  in the last 168 hours   Micro Results: Recent Results (from the past 240 hour(s))  SURGICAL PCR SCREEN     Status: None   Collection Time    10/18/12  3:04 AM      Result Value Range Status   MRSA, PCR NEGATIVE  NEGATIVE Final   Staphylococcus aureus NEGATIVE  NEGATIVE Final   Comment:            The Xpert SA Assay (FDA     approved for NASAL specimens     in patients over 31 years of age),     is one component of     a comprehensive surveillance     program.  Test performance has     been validated by [employer] for patients greater     than or equal to 89 year old.     It is not intended     to diagnose infection nor to     guide or monitor treatment.    Studies/Results: Dg Chest 1 View  10/17/2012  *RADIOLOGY REPORT*  Clinical Data: Larey Seat.  Left hip  pain.  CHEST - 1 VIEW  Comparison: 02/18/2007.  Findings: The cardiac silhouette, mediastinal and hilar contours are normal and stable.  There is mild tortuosity of the thoracic aorta and part due to thoracolumbar scoliosis.  The lungs are clear.  Mild stable emphysematous changes.  No pleural effusion. The bony thorax is intact.  IMPRESSION: No acute cardiopulmonary findings.   Original Report Authenticated By: Rudie Meyer, M.D.    Dg Lumbar Spine 2-3 Views  10/17/2012  *RADIOLOGY REPORT*  Clinical Data: Pain.  Left intertrochanteric hip fracture diagnosed earlier today.  LUMBAR SPINE - 2-3 VIEW  Comparison: CT abdomen/pelvis 10/17/2006.  Findings: Imaging is suboptimal due to the patient's inability to be properly positioned due to pain from previously diagnosed fracture.  Rightward scoliosis of the lumbar spine centered at L3 noted.  Multilevel disc degenerative change is identified, most prominent at L4-L5 and L5-S1.  Aortic calcification noted with stable  visualized maximal diameter of 2.0 cm in anteroposterior dimension, although not optimally imaged on today's exam.  IMPRESSION: Lumbar spine degenerative change.  No compression deformity identified.   Original Report Authenticated By: Christiana Pellant, M.D.    Dg Hip Complete Left  10/17/2012  *RADIOLOGY REPORT*  Clinical Data: Larey Seat.  Left hip pain.  LEFT HIP - COMPLETE 2+ VIEW  Comparison: None  Findings: There is an intertrochanteric fracture of the left hip with mild displacement and a varus deformity.  The pubic symphysis and SI joints are intact.  The right hip appears normal.  IMPRESSION: Mildly displaced intertrochanteric fracture left hip.   Original Report Authenticated By: Rudie Meyer, M.D.    Dg Knee 1-2 Views Left  10/17/2012  *RADIOLOGY REPORT*  Clinical Data: Larey Seat.  Left knee pain.  LEFT KNEE - 1-2 VIEW  Comparison: None  Findings: The joint spaces are maintained.  No acute fracture or joint effusion.  IMPRESSION: No acute bony findings.   Original Report Authenticated By: Rudie Meyer, M.D.     Medications: Scheduled Meds: . aspirin EC  325 mg Oral Q breakfast  .  ceFAZolin (ANCEF) IV  2 g Intravenous Q8H  . docusate sodium  100 mg Oral BID  . FLUoxetine  40 mg Oral Daily  . levothyroxine  25 mcg Oral QAC breakfast  . lisdexamfetamine  70 mg Oral [id number]  . multivitamin with minerals  1 tablet Oral Daily  . polyethylene glycol  17 g Oral Daily      LOS: 2 days   Lani Mendiola M.D. [facility] 10/19/2012, 11:54 AM Pager: [phone]  If 7PM-7AM, please contact night-coverage [email or web address] Password [email or web address]

## 2012-10-19 NOTE — Op Note (Signed)
NAMETEODORA, BAUMGARTEN NO.:  [id number]  MEDICAL RECORD NO.:  [id number]  LOCATION:                                 FACILITY:  PHYSICIAN:  Burnard Bunting, M.D.    DATE OF BIRTH:  January 03, 1951  DATE OF PROCEDURE: DATE OF DISCHARGE:                              OPERATIVE REPORT   PREOPERATIVE DIAGNOSIS:  Left hip intertrochanteric fracture.  POSTOPERATIVE DIAGNOSIS:  Left hip intertrochanteric fracture.  PROCEDURE:  Left hip intertrochanteric fracture, open reduction and internal fixation with Smith and Nephew IMHS nail, no distal interlocks, nail measured 10 x 36 cm with CHS lag screw, 85 mm and captured compressing screw.  SURGEONS:  Burnard Bunting, M.D.  ASSISTANT:  None.  ANESTHESIA:  General endotracheal.  ESTIMATED BLOOD LOSS:  75 mL.  DRAINS:  None.  INDICATIONS:  Amy Hamilton is an 62 year old female with left hip pain and fracture following injury.  She presents now for operative management after explanation of risks and benefits.  PROCEDURE IN DETAIL:  The patient was brought to the operating room, where general endotracheal anesthesia was induced.  Preoperative antibiotics were administered.  Time-out was called.  The patient has placed on the fracture table with the right leg in lithotomy position. Peroneal nerve well padded.  Left leg was then placed under traction in internal rotation.  Under fluoroscopic guidance, the fracture was reduced.  The fracture reduced well proximally, filled about 8 mm gap distally, which was partially corrected with adduction.  Following optimal fracture reduction, the lateral left hip was prescribed with alcohol and Betadine, which was allowed to dry, prepped with DuraPrep solution and draped in sterile manner.  Wall drape was utilized under fluoroscopic guidance.  Proximal incision was made, handbreadth proximal to the tip of the trochanter.  Skin and subcutaneous tissues were sharply divided.  Fascia lata was  divided.  Guidepin was then placed just medial to the tip of the trochanter in order to maximize the effect of the compression screw.  The proximal reaming was then performed in accordance with preoperative templating, 10 x 36 mm nail was then placed.  Lag screw was then placed in the inferior half of the femoral neck and head in the center location on the AP and lateral, 5 mm from the articular surface.  This gave very solid fixation.  Compression screw was then placed through the lag screw in order to facilitate closure of the final portion of the very minimal fracture displacement. This gave excellent restoration of height.  Excellent restoration of the of the anatomy.  At this time, thorough irrigation was performed on both incisions.  Second incision was for placement of the compression and lag screw.  Both incisions were then irrigated and closed using 0 Vicryl suture, 3-0 Vicryl suture, and skin staples.  Mepilex dressing was applied.  The patient tolerated the procedure well without immediate complication and transferred to recovery room in stable condition.     Burnard Bunting, M.D.     GSD/MEDQ  D:  10/18/2012  T:  10/19/2012  Job:  [id number]

## 2012-10-19 NOTE — Progress Notes (Signed)
Rehab Admissions Coordinator Note:  Patient was screened by Meryl Dare for appropriateness for an Inpatient Acute Rehab Consult.  At this time, we are recommending Inpatient Rehab consult.  Meryl Dare 10/19/2012, 10:02 AM  I can be reached at [phone].

## 2012-10-19 NOTE — Progress Notes (Signed)
Clinical Social Work Department BRIEF PSYCHOSOCIAL ASSESSMENT 10/19/2012  Patient:  Amy Hamilton, Amy Hamilton     Account Number:  [id number]     Admit date:  10/17/2012  Clinical Social Worker:  Robin Searing  Date/Time:  10/19/2012 01:55 PM  Referred by:  Physician  Date Referred:  10/19/2012 Referred for  SNF Placement   Other Referral:   Interview type:  Patient Other interview type:    PSYCHOSOCIAL DATA Living Status:  FAMILY Admitted from facility:   Level of care:   Primary support name:  SON Primary support relationship to patient:  FAMILY Degree of support available:   GOOD    CURRENT CONCERNS Current Concerns  Post-Acute Placement   Other Concerns:    SOCIAL WORK ASSESSMENT / PLAN Patient is open to considering SNF if needed at d.c- she has gotten up with PT today- states she is "worn out" from the therapy and experieincing some pain in her hip/leg.   Assessment/plan status:  Other - See comment Other assessment/ plan:   Will complete FL[place] and [place] for SNF search   Information/referral to community resources:   SNF  [facility]    PATIENT'S/FAMILY'S RESPONSE TO PLAN OF CARE: Patient agreeable to SNF search in case she needs this prior to d/c home. CSW will f/u with bed offers for patient selection-    [name], MSW, [employer] [phone]

## 2012-10-20 LAB — BASIC METABOLIC PANEL
BUN: 6 mg/dL (ref 6–23)
CO2: 27 mEq/L (ref 19–32)
Calcium: 8.3 mg/dL — ABNORMAL LOW (ref 8.4–10.5)
Chloride: 94 mEq/L — ABNORMAL LOW (ref 96–112)
Creatinine, Ser: 0.45 mg/dL — ABNORMAL LOW (ref 0.50–1.10)
GFR calc Af Amer: >90 mL/min (ref >90)
GFR calc non Af Amer: >90 mL/min (ref >90)
Glucose, Bld: 125 mg/dL — ABNORMAL HIGH (ref 70–99)
Potassium: 4 mEq/L (ref 3.5–5.1)
Sodium: 129 mEq/L — ABNORMAL LOW (ref 135–145)

## 2012-10-20 LAB — PROTIME-INR
INR: 1.14 (ref 0.00–1.49)
Prothrombin Time: 14.4 seconds (ref 11.6–15.2)

## 2012-10-20 LAB — CBC
MCH: 31.3 pg (ref 26.0–34.0)
MCHC: 36.1 g/dL — ABNORMAL HIGH (ref 30.0–36.0)
Platelets: 241 10*3/uL (ref 150–400)
RBC: 2.78 MIL/uL — ABNORMAL LOW (ref 3.87–5.11)
WBC: 10.4 10*3/uL (ref 4.0–10.5)

## 2012-10-20 NOTE — Progress Notes (Signed)
Physical Therapy Treatment Patient Details Name: Amy Hamilton MRN: [id number] DOB: [birth year] Today's Date: 10/20/2012 Time: 1478-2956 PT Time Calculation (min): 23 min  PT Assessment / Plan / Recommendation Comments on Treatment Session  Mobility continues to be limited by pain.  Pt motivated to participate in PT.    Follow Up Recommendations  CIR     Does the patient have the potential to tolerate intense rehabilitation     Barriers to Discharge        Equipment Recommendations  Rolling walker with 5" wheels    Recommendations for Other Services OT consult  Frequency Min 6X/week   Plan Discharge plan remains appropriate;Frequency remains appropriate    Precautions / Restrictions Precautions Precautions: Fall Restrictions LLE Weight Bearing: Partial weight bearing LLE Partial Weight Bearing Percentage or Pounds: 10%   Pertinent Vitals/Pain 10/10    Mobility  Bed Mobility Supine to Sit: 3: Mod assist;HOB elevated;With rails Sitting - Scoot to Edge of Bed: 4: Min assist;With rail Details for Bed Mobility Assistance: cues for sequencing Transfers Sit to Stand: 1: +2 Total assist;From bed;With upper extremity assist Sit to Stand: Patient Percentage: 50% Stand to Sit: 3: Mod assist;With upper extremity assist;To chair/3-in-1 Details for Transfer Assistance: cues for hand placement, sequencing Ambulation/Gait Ambulation/Gait Assistance: 4: Min assist Ambulation Distance (Feet): 10 Feet Assistive device: Rolling walker Ambulation/Gait Assistance Details: verbal cues for RW management, safety, and sequencing Gait Pattern: Step-to pattern    Exercises     PT Diagnosis:    PT Problem List:   PT Treatment Interventions:     PT Goals Acute Rehab PT Goals PT Goal: Supine/Side to Sit - Progress: Progressing toward goal PT Goal: Sit to Supine/Side - Progress: Progressing toward goal PT Goal: Sit to Stand - Progress: Progressing toward goal PT Goal: Stand to Sit -  Progress: Progressing toward goal PT Goal: Ambulate - Progress: Progressing toward goal PT Goal: Up/Down Stairs - Progress: Progressing toward goal  Visit Information  Last PT Received On: 10/20/12 Assistance Needed: +2 (for ambulation)    Subjective Data  Subjective: "My bed is wet." Patient Stated Goal: home with dogs   Cognition  Cognition Overall Cognitive Status: Appears within functional limits for tasks assessed/performed Arousal/Alertness: Awake/alert Orientation Level: Appears intact for tasks assessed Behavior During Session: [facility] for tasks performed    Balance     End of Session PT - End of Session Equipment Utilized During Treatment: Gait belt Activity Tolerance: Patient limited by pain Patient left: in chair;with call bell/phone within reach Nurse Communication: Patient requests pain meds   GP     Ilda Foil 10/20/2012, 10:46 AM  Aida Raider, PT  Office # [phone] Pager [phone]

## 2012-10-20 NOTE — Progress Notes (Signed)
Subjective: 2 Days Post-Op Procedure(s) (LRB): INTRAMEDULLARY (IM) NAIL FEMORAL (Left) Patient reports pain as 7 on 0-10 scale.    Objective: Vital signs in last 24 hours: Temp:  [98.3 F (36.8 C)-99.6 F (37.6 C)] 98.3 F (36.8 C) (03/29 0542) Pulse Rate:  [85-92] 85 (03/29 0542) Resp:  [18-20] 18 (03/29 0542) BP: (123-137)/(68-72) 123/72 mmHg (03/28 2100) SpO2:  [88 %-100 %] 100 % (03/29 0542)  Intake/Output from previous day: 03/28 0701 - 03/29 0700 In: 2525 [P.O.:1200; I.V.:1325] Out: 1050 [Urine:1050] Intake/Output this shift:     Recent Labs  10/17/12 1540 10/18/12 0557 10/19/12 0615 10/20/12 0640  HGB 11.1* 11.1* 9.0* 8.7*    Recent Labs  10/19/12 0615 10/20/12 0640  WBC 10.3 10.4  RBC 2.80* 2.78*  HCT 25.5* 24.1*  PLT 232 241    Recent Labs  10/19/12 0615 10/20/12 0640  NA 128* 129*  K 4.0 4.0  CL 95* 94*  CO2 26 27  BUN 7 6  CREATININE 0.54 0.45*  GLUCOSE 120* 125*  CALCIUM 7.8* 8.3*    Recent Labs  10/19/12 0615 10/20/12 0640  INR 1.08 1.14    Neurologically intact dressing dry.calf soft  Assessment/Plan: 2 Days Post-Op Procedure(s) (LRB): INTRAMEDULLARY (IM) NAIL FEMORAL (Left) Up with therapy xrays look good  Anjalina Bergevin C 10/20/2012, 10:16 AM

## 2012-10-20 NOTE — Progress Notes (Signed)
Patient ID: Amy Hamilton  female  LKG:[id number]    DOB: 05-03-1951    DOA: 10/17/2012  PCP: Kaleen Mask, MD  Assessment/Plan: Principal Problem:   Hip fracture, left secondary to mechanical fall: s/p intramedullary nail, postop day 2 - up with PT - per ortho, ASA for DVT prophylaxis  - CIR consult placed  Active Problems:   Hypothyroidism - continue Synthroid    Depression - stable, on Prozac     Back pain - Lumbar spine x-ray was negative for any compression fracture  DVT Prophylaxis: SCD  Code Status:  Disposition: await CIR     Subjective: Feels a whole lot better today  Objective: Weight change:   Intake/Output Summary (Last 24 hours) at 10/20/12 1540 Last data filed at 10/20/12 0600  Gross per 24 hour  Intake   2045 ml  Output    600 ml  Net   1445 ml   Blood pressure 119/65, pulse 76, temperature 98.7 F (37.1 C), temperature source Oral, resp. rate 18, SpO2 93.00%.  Physical Exam: General: A xO x3 pleasant CVS: S1-S2 clear Chest: CTAB Abdomen: soft NT, ND, NBS Ext:  no c/ c /e bilaterally   Lab Results: Basic Metabolic Panel:  Recent Labs Lab 10/19/12 0615 10/20/12 0640  NA 128* 129*  K 4.0 4.0  CL 95* 94*  CO2 26 27  GLUCOSE 120* 125*  BUN 7 6  CREATININE 0.54 0.45*  CALCIUM 7.8* 8.3*   Liver Function Tests:  Recent Labs Lab 10/17/12 2137  ALBUMIN 3.3*   CBC:  Recent Labs Lab 10/17/12 1540  10/19/12 0615 10/20/12 0640  WBC 7.2  < > 10.3 10.4  NEUTROABS 4.7  --   --   --   HGB 11.1*  < > 9.0* 8.7*  HCT 32.0*  < > 25.5* 24.1*  MCV 89.6  < > 91.1 86.7  PLT 312  < > 232 241  < > = values in this interval not displayed.   Micro Results: Recent Results (from the past 240 hour(s))  SURGICAL PCR SCREEN     Status: None   Collection Time    10/18/12  3:04 AM      Result Value Range Status   MRSA, PCR NEGATIVE  NEGATIVE Final   Staphylococcus aureus NEGATIVE  NEGATIVE Final   Comment:            The Xpert SA  Assay (FDA     approved for NASAL specimens     in patients over 81 years of age),     is one component of     a comprehensive surveillance     program.  Test performance has     been validated by [employer] for patients greater     than or equal to 44 year old.     It is not intended     to diagnose infection nor to     guide or monitor treatment.    Studies/Results: Dg Chest 1 View  10/17/2012  *RADIOLOGY REPORT*  Clinical Data: Larey Seat.  Left hip pain.  CHEST - 1 VIEW  Comparison: 02/18/2007.  Findings: The cardiac silhouette, mediastinal and hilar contours are normal and stable.  There is mild tortuosity of the thoracic aorta and part due to thoracolumbar scoliosis.  The lungs are clear.  Mild stable emphysematous changes.  No pleural effusion. The bony thorax is intact.  IMPRESSION: No acute cardiopulmonary findings.   Original Report Authenticated By:  Rudie Meyer, M.D.    Dg Lumbar Spine 2-3 Views  10/17/2012  *RADIOLOGY REPORT*  Clinical Data: Pain.  Left intertrochanteric hip fracture diagnosed earlier today.  LUMBAR SPINE - 2-3 VIEW  Comparison: CT abdomen/pelvis 10/17/2006.  Findings: Imaging is suboptimal due to the patient's inability to be properly positioned due to pain from previously diagnosed fracture.  Rightward scoliosis of the lumbar spine centered at L3 noted.  Multilevel disc degenerative change is identified, most prominent at L4-L5 and L5-S1.  Aortic calcification noted with stable visualized maximal diameter of 2.0 cm in anteroposterior dimension, although not optimally imaged on today's exam.  IMPRESSION: Lumbar spine degenerative change.  No compression deformity identified.   Original Report Authenticated By: Christiana Pellant, M.D.    Dg Hip Complete Left  10/17/2012  *RADIOLOGY REPORT*  Clinical Data: Larey Seat.  Left hip pain.  LEFT HIP - COMPLETE 2+ VIEW  Comparison: None  Findings: There is an intertrochanteric fracture of the left hip with mild displacement and a  varus deformity.  The pubic symphysis and SI joints are intact.  The right hip appears normal.  IMPRESSION: Mildly displaced intertrochanteric fracture left hip.   Original Report Authenticated By: Rudie Meyer, M.D.    Dg Knee 1-2 Views Left  10/17/2012  *RADIOLOGY REPORT*  Clinical Data: Larey Seat.  Left knee pain.  LEFT KNEE - 1-2 VIEW  Comparison: None  Findings: The joint spaces are maintained.  No acute fracture or joint effusion.  IMPRESSION: No acute bony findings.   Original Report Authenticated By: Rudie Meyer, M.D.     Medications: Scheduled Meds: . aspirin EC  325 mg Oral Q breakfast  . docusate sodium  100 mg Oral BID  . FLUoxetine  40 mg Oral Daily  . levothyroxine  25 mcg Oral QAC breakfast  . lisdexamfetamine  70 mg Oral BH-q7a  . multivitamin with minerals  1 tablet Oral Daily  . polyethylene glycol  17 g Oral Daily      LOS: 3 days   RAI,RIPUDEEP M.D. [facility] 10/20/2012, 3:40 PM Pager: [phone]  If 7PM-7AM, please contact night-coverage [email or web address] Password [email or web address]

## 2012-10-21 ENCOUNTER — Inpatient Hospital Stay (HOSPITAL_COMMUNITY): Payer: Medicaid Other

## 2012-10-21 LAB — CBC
MCHC: 36.1 g/dL — ABNORMAL HIGH (ref 30.0–36.0)
Platelets: 251 10*3/uL (ref 150–400)
RDW: 11.9 % (ref 11.5–15.5)
WBC: 8.9 10*3/uL (ref 4.0–10.5)

## 2012-10-21 LAB — VITAMIN D 1,25 DIHYDROXY
Vitamin D 1, 25 (OH)2 Total: 41 pg/mL (ref 18–72)
Vitamin D3 1, 25 (OH)2: 41 pg/mL

## 2012-10-21 LAB — PROTIME-INR
INR: 1.02 (ref 0.00–1.49)
Prothrombin Time: 13.3 seconds (ref 11.6–15.2)

## 2012-10-21 MED ORDER — ALBUTEROL SULFATE HFA 108 (90 BASE) MCG/ACT IN AERS: 1.0000 | INHALATION_SPRAY | Freq: Four times a day (QID) | RESPIRATORY_TRACT | Status: DC | PRN

## 2012-10-21 MED ORDER — FLEET ENEMA 7-19 GM/118ML RE ENEM
1.0000 | ENEMA | Freq: Once | RECTAL | Status: AC
  Administered 2012-10-22: 1 via RECTAL
  Filled 2012-10-21: qty 1

## 2012-10-21 MED ORDER — NICOTINE 21 MG/24HR TD PT24
21.0000 mg | MEDICATED_PATCH | Freq: Every day | TRANSDERMAL | Status: DC
  Administered 2012-10-21 – 2012-10-22 (×2): 21 mg via TRANSDERMAL
  Filled 2012-10-21 (×3): qty 1

## 2012-10-21 NOTE — Progress Notes (Signed)
Patient ID: Amy Hamilton  female  MVH:[id number]    DOB: 12-14-1950    DOA: 10/17/2012  PCP: Kaleen Mask, MD  Assessment/Plan: Principal Problem:   Hip fracture, left secondary to mechanical fall: s/p intramedullary nail, postop day 3 - per ortho, ASA for DVT prophylaxis  - CIR consult placed- > awaiting rec's likely tomorrow  Active Problems:   Hypothyroidism - continue Synthroid    Depression - stable, on Prozac     Back pain - Lumbar spine x-ray was negative for any compression fracture  Constipation: - On MiraLax, Colace, Dulcolax supp, add enema today   Anemia: - Hemoglobin drifting down however she is asymptomatic, recheck CBC in am will have to transfuse 1 unit if less than 8  DVT Prophylaxis: SCD  Code Status:  Disposition: await CIR     Subjective: Feels a whole lot better today with pain but constipated  Objective: Weight change:   Intake/Output Summary (Last 24 hours) at 10/21/12 1018 Last data filed at 10/21/12 0855  Gross per 24 hour  Intake    240 ml  Output    500 ml  Net   -260 ml   Blood pressure 134/62, pulse 78, temperature 98.5 F (36.9 C), temperature source Oral, resp. rate 16, SpO2 94.00%.  Physical Exam: General: A xO x3  CVS: S1-S2 clear Chest: CTAB Abdomen: soft NT, ND, NBS Ext:  no c/ c /e bilaterally   Lab Results: Basic Metabolic Panel:  Recent Labs Lab 10/19/12 0615 10/20/12 0640  NA 128* 129*  K 4.0 4.0  CL 95* 94*  CO2 26 27  GLUCOSE 120* 125*  BUN 7 6  CREATININE 0.54 0.45*  CALCIUM 7.8* 8.3*   Liver Function Tests:  Recent Labs Lab 10/17/12 2137  ALBUMIN 3.3*   CBC:  Recent Labs Lab 10/17/12 1540  10/20/12 0640 10/21/12 0643  WBC 7.2  < > 10.4 8.9  NEUTROABS 4.7  --   --   --   HGB 11.1*  < > 8.7* 8.2*  HCT 32.0*  < > 24.1* 22.7*  MCV 89.6  < > 86.7 88.0  PLT 312  < > 241 251  < > = values in this interval not displayed.   Micro Results: Recent Results (from the past 240 hour(s))   SURGICAL PCR SCREEN     Status: None   Collection Time    10/18/12  3:04 AM      Result Value Range Status   MRSA, PCR NEGATIVE  NEGATIVE Final   Staphylococcus aureus NEGATIVE  NEGATIVE Final   Comment:            The Xpert SA Assay (FDA     approved for NASAL specimens     in patients over 63 years of age),     is one component of     a comprehensive surveillance     program.  Test performance has     been validated by [employer] for patients greater     than or equal to 45 year old.     It is not intended     to diagnose infection nor to     guide or monitor treatment.    Studies/Results: Dg Chest 1 View  10/17/2012  *RADIOLOGY REPORT*  Clinical Data: Larey Seat.  Left hip pain.  CHEST - 1 VIEW  Comparison: 02/18/2007.  Findings: The cardiac silhouette, mediastinal and hilar contours are normal and stable.  There is mild  tortuosity of the thoracic aorta and part due to thoracolumbar scoliosis.  The lungs are clear.  Mild stable emphysematous changes.  No pleural effusion. The bony thorax is intact.  IMPRESSION: No acute cardiopulmonary findings.   Original Report Authenticated By: Rudie Meyer, M.D.    Dg Lumbar Spine 2-3 Views  10/17/2012  *RADIOLOGY REPORT*  Clinical Data: Pain.  Left intertrochanteric hip fracture diagnosed earlier today.  LUMBAR SPINE - 2-3 VIEW  Comparison: CT abdomen/pelvis 10/17/2006.  Findings: Imaging is suboptimal due to the patient's inability to be properly positioned due to pain from previously diagnosed fracture.  Rightward scoliosis of the lumbar spine centered at L3 noted.  Multilevel disc degenerative change is identified, most prominent at L4-L5 and L5-S1.  Aortic calcification noted with stable visualized maximal diameter of 2.0 cm in anteroposterior dimension, although not optimally imaged on today's exam.  IMPRESSION: Lumbar spine degenerative change.  No compression deformity identified.   Original Report Authenticated By: Christiana Pellant, M.D.     Dg Hip Complete Left  10/17/2012  *RADIOLOGY REPORT*  Clinical Data: Larey Seat.  Left hip pain.  LEFT HIP - COMPLETE 2+ VIEW  Comparison: None  Findings: There is an intertrochanteric fracture of the left hip with mild displacement and a varus deformity.  The pubic symphysis and SI joints are intact.  The right hip appears normal.  IMPRESSION: Mildly displaced intertrochanteric fracture left hip.   Original Report Authenticated By: Rudie Meyer, M.D.    Dg Knee 1-2 Views Left  10/17/2012  *RADIOLOGY REPORT*  Clinical Data: Larey Seat.  Left knee pain.  LEFT KNEE - 1-2 VIEW  Comparison: None  Findings: The joint spaces are maintained.  No acute fracture or joint effusion.  IMPRESSION: No acute bony findings.   Original Report Authenticated By: Rudie Meyer, M.D.     Medications: Scheduled Meds: . aspirin EC  325 mg Oral Q breakfast  . docusate sodium  100 mg Oral BID  . FLUoxetine  40 mg Oral Daily  . levothyroxine  25 mcg Oral QAC breakfast  . lisdexamfetamine  70 mg Oral BH-q7a  . multivitamin with minerals  1 tablet Oral Daily  . polyethylene glycol  17 g Oral Daily  . sodium phosphate  1 enema Rectal Once      LOS: 4 days   Martin Belling M.D. [facility] 10/21/2012, 10:18 AM Pager: [phone]  If 7PM-7AM, please contact night-coverage [email or web address] Password [email or web address]

## 2012-10-21 NOTE — Progress Notes (Signed)
Subjective: 3 Days Post-Op Procedure(s) (LRB): INTRAMEDULLARY (IM) NAIL FEMORAL (Left) Patient reports pain as mild.    Objective: Vital signs in last 24 hours: Temp:  [98.5 F (36.9 C)-98.7 F (37.1 C)] 98.5 F (36.9 C) (03/30 0641) Pulse Rate:  [76-78] 78 (03/30 0641) Resp:  [16-18] 16 (03/30 0641) BP: (119-134)/(62-65) 134/62 mmHg (03/30 0641) SpO2:  [93 %-96 %] 94 % (03/30 0641)  Intake/Output from previous day: 03/29 0701 - 03/30 0700 In: -  Out: 300 [Urine:300] Intake/Output this shift: Total I/O In: 240 [P.O.:240] Out: 200 [Urine:200]   Recent Labs  10/19/12 0615 10/20/12 0640 10/21/12 0643  HGB 9.0* 8.7* 8.2*    Recent Labs  10/20/12 0640 10/21/12 0643  WBC 10.4 8.9  RBC 2.78* 2.58*  HCT 24.1* 22.7*  PLT 241 251    Recent Labs  10/19/12 0615 10/20/12 0640  NA 128* 129*  K 4.0 4.0  CL 95* 94*  CO2 26 27  BUN 7 6  CREATININE 0.54 0.45*  GLUCOSE 120* 125*  CALCIUM 7.8* 8.3*    Recent Labs  10/20/12 0640 10/21/12 0643  INR 1.14 1.02    Neurologically intact  Assessment/Plan: 3 Days Post-Op Procedure(s) (LRB): INTRAMEDULLARY (IM) NAIL FEMORAL (Left) Up with therapy .   No bowel movement for 4 days. Orders for Tx, on chart , and RN giving med/sup to correct. Pain better. Hgb drifted down as expected but not symptomatic at this time.    Sincere Berlanga C 10/21/2012, 10:00 AM

## 2012-10-21 NOTE — Progress Notes (Signed)
At 4pm, pt called nurse into room with c/o of pain to the LLE.  Patient stated she was getting OOB when something started hurting.  It progressed as the day went on and when she returned back to bed it started with a stabbing pain.  Patient could not dorsiflex or plantar flex left foot.  Dressing was CDI with no new drainage, periwound to left hip was red.  VS 104/56 / hr 66 / p 50 / 97% on RA / 98.3temp / rr 18.  Dilaudid IV 1mg  administered along with Oxy IR (2) was given.  .MD notified of change in pain level and assessment findings, AP left femur x-ray ordered.  Patient's pain level decreased and dorsiflex & plantar flexion returned before x-ray was administered.  Nsg to continue to monitor for status changes.

## 2012-10-22 ENCOUNTER — Inpatient Hospital Stay (HOSPITAL_COMMUNITY)
Admission: RE | Admit: 2012-10-22 | Discharge: 2012-10-27 | DRG: 945 | Disposition: A | Discharge status: Home Health | Payer: Medicaid Other | Source: Intra-hospital | Attending: Physical Medicine & Rehabilitation | Admitting: Physical Medicine & Rehabilitation

## 2012-10-22 DIAGNOSIS — E039 Hypothyroidism, unspecified: Secondary | ICD-10-CM | POA: Diagnosis present

## 2012-10-22 DIAGNOSIS — E871 Hypo-osmolality and hyponatremia: Secondary | ICD-10-CM | POA: Diagnosis present

## 2012-10-22 DIAGNOSIS — S72143A Displaced intertrochanteric fracture of unspecified femur, initial encounter for closed fracture: Secondary | ICD-10-CM

## 2012-10-22 DIAGNOSIS — E876 Hypokalemia: Secondary | ICD-10-CM | POA: Diagnosis present

## 2012-10-22 DIAGNOSIS — F17200 Nicotine dependence, unspecified, uncomplicated: Secondary | ICD-10-CM | POA: Diagnosis present

## 2012-10-22 DIAGNOSIS — F329 Major depressive disorder, single episode, unspecified: Secondary | ICD-10-CM | POA: Diagnosis present

## 2012-10-22 DIAGNOSIS — Z5189 Encounter for other specified aftercare: Principal | ICD-10-CM

## 2012-10-22 DIAGNOSIS — S72009D Fracture of unspecified part of neck of unspecified femur, subsequent encounter for closed fracture with routine healing: Secondary | ICD-10-CM

## 2012-10-22 DIAGNOSIS — IMO0002 Reserved for concepts with insufficient information to code with codable children: Secondary | ICD-10-CM

## 2012-10-22 DIAGNOSIS — Z7902 Long term (current) use of antithrombotics/antiplatelets: Secondary | ICD-10-CM

## 2012-10-22 DIAGNOSIS — F3289 Other specified depressive episodes: Secondary | ICD-10-CM | POA: Diagnosis present

## 2012-10-22 DIAGNOSIS — W19XXXA Unspecified fall, initial encounter: Secondary | ICD-10-CM

## 2012-10-22 DIAGNOSIS — S72142A Displaced intertrochanteric fracture of left femur, initial encounter for closed fracture: Secondary | ICD-10-CM

## 2012-10-22 DIAGNOSIS — Y92009 Unspecified place in unspecified non-institutional (private) residence as the place of occurrence of the external cause: Secondary | ICD-10-CM

## 2012-10-22 DIAGNOSIS — D62 Acute posthemorrhagic anemia: Secondary | ICD-10-CM

## 2012-10-22 DIAGNOSIS — D649 Anemia, unspecified: Secondary | ICD-10-CM | POA: Diagnosis present

## 2012-10-22 LAB — CBC
HCT: 24.7 % — ABNORMAL LOW (ref 36.0–46.0)
Hemoglobin: 7.9 g/dL — ABNORMAL LOW (ref 12.0–15.0)
MCH: 31.5 pg (ref 26.0–34.0)
MCH: 29.9 pg (ref 26.0–34.0)
MCHC: 36.6 g/dL — ABNORMAL HIGH (ref 30.0–36.0)
MCV: 84 fL (ref 78.0–100.0)
Platelets: 330 10*3/uL (ref 150–400)
RDW: 11.9 % (ref 11.5–15.5)
RDW: 13.3 % (ref 11.5–15.5)

## 2012-10-22 LAB — BASIC METABOLIC PANEL
BUN: 8 mg/dL (ref 6–23)
Calcium: 8.3 mg/dL — ABNORMAL LOW (ref 8.4–10.5)
Creatinine, Ser: 0.42 mg/dL — ABNORMAL LOW (ref 0.50–1.10)
GFR calc non Af Amer: >90 mL/min (ref >90)
Glucose, Bld: 122 mg/dL — ABNORMAL HIGH (ref 70–99)
Potassium: 3.4 mEq/L — ABNORMAL LOW (ref 3.5–5.1)

## 2012-10-22 LAB — PROTIME-INR: Prothrombin Time: 13.8 seconds (ref 11.6–15.2)

## 2012-10-22 LAB — PREPARE RBC (CROSSMATCH)

## 2012-10-22 LAB — CREATININE, SERUM: GFR calc Af Amer: >90 mL/min (ref >90)

## 2012-10-22 MED ORDER — METHOCARBAMOL 100 MG/ML IJ SOLN: 500.0000 mg | Freq: Four times a day (QID) | INTRAVENOUS | Status: DC | PRN

## 2012-10-22 MED ORDER — FLUOXETINE HCL 20 MG PO CAPS
40.0000 mg | ORAL_CAPSULE | Freq: Every day | ORAL | Status: DC
  Administered 2012-10-23 – 2012-10-27 (×5): 40 mg via ORAL
  Filled 2012-10-22 (×6): qty 2

## 2012-10-22 MED ORDER — LISDEXAMFETAMINE DIMESYLATE 70 MG PO CAPS
70.0000 mg | ORAL_CAPSULE | ORAL | Status: DC
  Administered 2012-10-23 – 2012-10-27 (×5): 70 mg via ORAL
  Filled 2012-10-22 (×5): qty 1

## 2012-10-22 MED ORDER — ALBUTEROL SULFATE HFA 108 (90 BASE) MCG/ACT IN AERS: 1.0000 | INHALATION_SPRAY | Freq: Four times a day (QID) | RESPIRATORY_TRACT | Status: DC | PRN

## 2012-10-22 MED ORDER — SORBITOL 70 % SOLN: 30.0000 mL | Freq: Every day | Status: DC | PRN

## 2012-10-22 MED ORDER — POLYETHYLENE GLYCOL 3350 17 G PO PACK
17.0000 g | PACK | Freq: Every day | ORAL | Status: DC
  Administered 2012-10-23 – 2012-10-27 (×4): 17 g via ORAL
  Filled 2012-10-22 (×6): qty 1

## 2012-10-22 MED ORDER — ADULT MULTIVITAMIN W/MINERALS CH
1.0000 | ORAL_TABLET | Freq: Every day | ORAL | Status: DC
  Administered 2012-10-23 – 2012-10-27 (×5): 1 via ORAL
  Filled 2012-10-22 (×6): qty 1

## 2012-10-22 MED ORDER — ASPIRIN EC 325 MG PO TBEC
325.0000 mg | DELAYED_RELEASE_TABLET | Freq: Every day | ORAL | Status: DC
  Administered 2012-10-23: 325 mg via ORAL
  Filled 2012-10-22 (×2): qty 1

## 2012-10-22 MED ORDER — NICOTINE 21 MG/24HR TD PT24
21.0000 mg | MEDICATED_PATCH | Freq: Every day | TRANSDERMAL | Status: DC
  Administered 2012-10-23 – 2012-10-27 (×4): 21 mg via TRANSDERMAL
  Filled 2012-10-22 (×7): qty 1

## 2012-10-22 MED ORDER — METHOCARBAMOL 500 MG PO TABS
500.0000 mg | ORAL_TABLET | Freq: Four times a day (QID) | ORAL | Status: DC | PRN
  Administered 2012-10-23: 500 mg via ORAL
  Filled 2012-10-22: qty 1

## 2012-10-22 MED ORDER — OXYCODONE HCL 5 MG PO TABS
5.0000 mg | ORAL_TABLET | ORAL | Status: DC | PRN
  Administered 2012-10-22 – 2012-10-26 (×15): 10 mg via ORAL
  Administered 2012-10-27: 5 mg via ORAL
  Filled 2012-10-22 (×15): qty 2

## 2012-10-22 MED ORDER — LEVOTHYROXINE SODIUM 25 MCG PO TABS
25.0000 ug | ORAL_TABLET | Freq: Every day | ORAL | Status: DC
  Administered 2012-10-23 – 2012-10-27 (×5): 25 ug via ORAL
  Filled 2012-10-22 (×6): qty 1

## 2012-10-22 MED ORDER — ONDANSETRON HCL 4 MG/2ML IJ SOLN: 4.0000 mg | Freq: Four times a day (QID) | INTRAMUSCULAR | Status: DC | PRN

## 2012-10-22 MED ORDER — ENOXAPARIN SODIUM 40 MG/0.4ML ~~LOC~~ SOLN
40.0000 mg | SUBCUTANEOUS | Status: DC
  Administered 2012-10-22 – 2012-10-25 (×4): 40 mg via SUBCUTANEOUS
  Filled 2012-10-22 (×6): qty 0.4

## 2012-10-22 MED ORDER — ONDANSETRON HCL 4 MG PO TABS
4.0000 mg | ORAL_TABLET | Freq: Four times a day (QID) | ORAL | Status: DC | PRN
  Administered 2012-10-23 – 2012-10-27 (×2): 4 mg via ORAL
  Filled 2012-10-22 (×2): qty 1

## 2012-10-22 MED ORDER — ACETAMINOPHEN 325 MG PO TABS
325.0000 mg | ORAL_TABLET | ORAL | Status: DC | PRN
  Administered 2012-10-24 – 2012-10-27 (×4): 650 mg via ORAL
  Filled 2012-10-22 (×4): qty 2

## 2012-10-22 MED ORDER — FERROUS SULFATE 325 (65 FE) MG PO TABS
325.0000 mg | ORAL_TABLET | Freq: Two times a day (BID) | ORAL | Status: DC
  Administered 2012-10-22 – 2012-10-27 (×10): 325 mg via ORAL
  Filled 2012-10-22 (×13): qty 1

## 2012-10-22 NOTE — Progress Notes (Signed)
CSW met with patient this morning- she reports plan to go to CIR today or tomorrow (blood transfusion pending) and not to pursue SNF further- will cancel SNF search and sign off. [name], MSW, [facility] [phone]

## 2012-10-22 NOTE — Progress Notes (Signed)
I met with patient at bedside. She prefers inpt rehab rather than SNF at this time. She is making arrangements for care of her 61 year old son, Amy Hamilton, at home with special needs. I have discussed with Dr. Isidoro Donning and we will begin blood transfusion on acute and d/c to IP rehab later today. Patient is in agreement. [phone]

## 2012-10-22 NOTE — Progress Notes (Signed)
OT Cancellation Note  Patient Details Name: Amy Hamilton MRN: [id number] DOB: Mar 08, 1951   Cancelled Treatment:    Reason Eval/Treat Not Completed: Other (comment) (screen - pt with plan to discharge to CIR)  [name] [phone] 10/22/2012, 3:11 PM

## 2012-10-22 NOTE — Interval H&P Note (Signed)
Amy Hamilton was admitted today to Inpatient Rehabilitation with the diagnosis of left IT hip fx.  The patient's history has been reviewed, patient examined, and there is no change in status.  Patient continues to be appropriate for intensive inpatient rehabilitation.  I have reviewed the patient's chart and labs.  Questions were answered to the patient's satisfaction.  [name] T 10/22/2012, 9:31 PM

## 2012-10-22 NOTE — PMR Pre-admission (Signed)
PMR Admission Coordinator Pre-Admission Assessment  Patient: Amy Hamilton is an 62 y.o., female MRN: [id number] DOB: 06-06-1951 Height:   Weight:               Insurance Information HMO:     PPO:      PCP:      IPA:      80/20:      OTHER:  PRIMARY: Medicaid [place] [employer]       Policy#: [id number]      Subscriber: pt Benefits:  Phone #: [phone]     Name: 10/22/12 Eff. Date: active 10/22/12      I checked to see if pt has Medicare and she does not on 10/22/12  Emergency Contact Information Contact Information   Name Relation Home Work Mobile   [place] Daughter [phone]       Current Medical History  Patient Admitting Diagnosis: Left IT hip fx  History of Present Illness: Amy Hamilton is a 62 y.o. right-handed female admitted 10/17/2012 after a fall while in the kitchen landing on her left hip without loss of consciousness. X-rays and imaging revealed a displaced left intertrochanteric hip fracture. Underwent intramedullary nail 10/18/2012 per Dr. August Saucer. Advised partial weightbearing left lower extremity. Postoperative anemia  With Hgb 7.9 today with plans to transfuse one unit today 10/22/12. Pain limiting her mobility this far. Patient with no BM since admission. Patient on stool softeners and has had a suppository with no results. Plans are for an enema today 3/31.   Past Medical History  Past Medical History  Diagnosis Date  . Thyroid disease   . Depression     Family History  family history is not on file.  Prior Rehab/Hospitalizations: none   Current Medications  Current facility-administered medications:acetaminophen (TYLENOL) suppository 650 mg, 650 mg, Rectal, Q6H PRN, Cammy Copa, MD;  acetaminophen (TYLENOL) tablet 650 mg, 650 mg, Oral, Q6H PRN, Cammy Copa, MD;  albuterol (PROVENTIL HFA;VENTOLIN HFA) 108 (90 BASE) MCG/ACT inhaler 1 puff, 1 puff, Inhalation, Q6H PRN, Ripudeep K Rai, MD;  aspirin EC tablet 325 mg, 325 mg, Oral, Q breakfast,  Cammy Copa, MD, 325 mg at 10/22/12 0831 bisacodyl (DULCOLAX) suppository 10 mg, 10 mg, Rectal, Daily PRN, Ripudeep K Rai, MD, 10 mg at 10/21/12 2000;  docusate sodium (COLACE) capsule 100 mg, 100 mg, Oral, BID, Ripudeep K Rai, MD, 100 mg at 10/22/12 9528;  FLUoxetine (PROZAC) capsule 40 mg, 40 mg, Oral, Daily, Cammy Copa, MD, 40 mg at 10/22/12 4132;  HYDROmorphone (DILAUDID) injection 0.1 mg, 0.1 mg, Intravenous, Q2H PRN, Cammy Copa, MD HYDROmorphone (DILAUDID) injection 1 mg, 1 mg, Intravenous, Q2H PRN, Valeria Batman, MD, 1 mg at 10/21/12 1612;  levothyroxine (SYNTHROID, LEVOTHROID) tablet 25 mcg, 25 mcg, Oral, QAC breakfast, Cammy Copa, MD, 25 mcg at 10/22/12 [phone];  lisdexamfetamine (VYVANSE) capsule 70 mg, 70 mg, Oral, BH-q7a, Cammy Copa, MD, 70 mg at 10/22/12 0831;  menthol-cetylpyridinium (CEPACOL) lozenge 3 mg, 1 lozenge, Oral, PRN, Cammy Copa, MD methocarbamol (ROBAXIN) 500 mg in dextrose 5 % 50 mL IVPB, 500 mg, Intravenous, Q6H PRN, Cammy Copa, MD;  methocarbamol (ROBAXIN) tablet 500 mg, 500 mg, Oral, Q6H PRN, Cammy Copa, MD, 500 mg at 10/22/12 0258;  metoCLOPramide (REGLAN) injection 5-10 mg, 5-10 mg, Intravenous, Q8H PRN, Cammy Copa, MD, 10 mg at 10/19/12 2035;  metoCLOPramide (REGLAN) tablet 5-10 mg, 5-10 mg, Oral, Q8H PRN, Cammy Copa, MD multivitamin with minerals tablet 1  tablet, 1 tablet, Oral, Daily, Ripudeep K Rai, MD, 1 tablet at 10/22/12 [phone];  nicotine (NICODERM CQ - dosed in mg/24 hours) patch 21 mg, 21 mg, Transdermal, Daily, Randall M Reidler, PA-C, 21 mg at 10/22/12 9604;  ondansetron (ZOFRAN) injection 4 mg, 4 mg, Intravenous, Q6H PRN, Ripudeep K Rai, MD;  ondansetron (ZOFRAN) injection 4 mg, 4 mg, Intravenous, Q6H PRN, Cammy Copa, MD ondansetron [facility]) tablet 4 mg, 4 mg, Oral, Q6H PRN, Cammy Copa, MD;  oxyCODONE (Oxy IR/ROXICODONE) immediate release tablet 10 mg, 10 mg, Oral, Q3H PRN,  Cammy Copa, MD, 10 mg at 10/21/12 1536;  oxyCODONE (Oxy IR/ROXICODONE) immediate release tablet 5-10 mg, 5-10 mg, Oral, Q4H PRN, Cammy Copa, MD, 10 mg at 10/22/12 0257 phenol (CHLORASEPTIC) mouth spray 1 spray, 1 spray, Mouth/Throat, PRN, Cammy Copa, MD, 1 spray at 10/20/12 [phone];  polyethylene glycol (MIRALAX / GLYCOLAX) packet 17 g, 17 g, Oral, Daily, Ripudeep K Rai, MD, 17 g at 10/22/12 8119;  sodium phosphate (FLEET) 7-19 GM/118ML enema 1 enema, 1 enema, Rectal, Once, Ripudeep K Rai, MD  Patients Current Diet: General  Precautions / Restrictions Precautions Precautions: Fall Restrictions Weight Bearing Restrictions: Yes LLE Weight Bearing: Partial weight bearing LLE Partial Weight Bearing Percentage or Pounds: 10%   Prior Activity Level Community (5-7x/wk): active ; disabled from her back issues per pt  Home Assistive Devices / Equipment Home Adaptive Equipment: None  Prior Functional Level Prior Function Level of Independence: Independent Able to Take Stairs?: Yes Driving: Yes Vocation: On disability  Current Functional Level Cognition  Arousal/Alertness: Awake/alert Overall Cognitive Status: Appears within functional limits for tasks assessed/performed Orientation Level: Oriented X4    Extremity Assessment (includes Sensation/Coordination)     RLE ROM/Strength/Tone: WFL for tasks assessed RLE Sensation: WFL - Light Touch    ADLs       Mobility  Bed Mobility: Supine to Sit;Sitting - Scoot to Edge of Bed Supine to Sit: 3: Mod assist;HOB elevated;With rails Sitting - Scoot to Edge of Bed: 4: Min assist;With rail    Transfers  Transfers: Sit to Stand;Stand to [employer] Transfers Sit to Stand: 1: +2 Total assist;From bed;With upper extremity assist Sit to Stand: Patient Percentage: 50% Stand to Sit: 3: Mod assist;With upper extremity assist;To chair/3-in-1 Stand Pivot Transfers: 3: Mod assist;With armrests    Ambulation / Gait /  Stairs / Wheelchair Mobility  Ambulation/Gait Ambulation/Gait Assistance: 4: Min Environmental consultant (Feet): 10 Feet Assistive device: Rolling walker Ambulation/Gait Assistance Details: verbal cues for RW management, safety, and sequencing Gait Pattern: Step-to pattern Stairs: No Wheelchair Mobility Wheelchair Mobility: No    Posture / Balance      Special needs/care consideration Bowel mgmt: no BM since admit. On stool softners, had suppository without results. Plan for enema today 10/22/12. Bladder mgmt: continent   Previous Home Environment Living Arrangements: Children (49 year old special needs son) Lives With: Son (78 yr old son with "special problems" per pt.  ) Available Help at Discharge: Available PRN/intermittently (son , Gregary Signs , there 24/7 supervision only assist) Type of Home: House Home Layout: Laundry or work area in basement;One level Home Access: Stairs to enter Entrance Stairs-Rails: None Entrance Stairs-Number of Steps: 2 Bathroom Shower/Tub: Engineer, manufacturing systems: Standard Bathroom Accessibility: Yes How Accessible: Accessible via walker Home Care Services: No Additional Comments: seperated from husband for a couple of years; Daughter, Avin prn only and son, Jomarie Longs, prn only  Discharge Living Setting Plans for Discharge Living Setting:  Patient's home;Lives with (comment) (60 yo son, Gregary Signs, with special needs) Type of Home at Discharge: House Discharge Home Layout: One level;Laundry or work area in basement Discharge Home Access: Stairs to enter Entrance Stairs-Rails: None Secretary/administrator of Steps: 2 steps Discharge Bathroom Shower/Tub: Community education officer: Standard Discharge Bathroom Accessibility: Yes How Accessible: Accessible via walker Do you have any problems obtaining your medications?: No  Social/Family/Support Systems Patient Roles: Advertising account executive (supervision she provides to [place] as  needed) Contact Information: Cleophas Dunker, daughter Anticipated Caregiver: Gregary Signs and grandson , Bethann Berkshire, and daughter, Archie Patten Anticipated Caregiver's Contact Information: cell [phone] Ability/Limitations of Caregiver: Archie Patten works 2 to 3 jobs; Fish farm manager in [employer] in [place] and to be released in the next week. Patient is contacting his base through the [employer] to get him home sooner if possible to help with Gregary Signs and her care Caregiver Availability: Intermittent Discharge Plan Discussed with Primary Caregiver: Yes Is Caregiver In Agreement with Plan?: Yes Does Caregiver/Family have Issues with Lodging/Transportation while Pt is in Rehab?: No  Goals/Additional Needs Patient/Family Goal for Rehab: Mod I PT and OT Expected length of stay: ELOS 7 to 10 days Special Service Needs: Gregary Signs is 62 years old. Per pt with a mind of a 62 year old. Bipolar, PTSD, anxiety disorder. He is disabled but physically able to care for himself. She homeschooled him throught his education. Additional Information: Bethann Berkshire, grandson in basic training in [place]. Unable to complete for he has gout. Plans were for hiim to come home in one week. Pt is contacting the [employer] to try to assist him in getting home sooner. Pt/Family Agrees to Admission and willing to participate: Yes Program Orientation Provided & Reviewed with Pt/Caregiver Including Roles  & Responsibilities: Yes   Decrease burden of Care through IP rehab admission: n/a  Possible need for SNF placement upon discharge: not anticipated   Patient Condition: This patient's condition remains as documented in the consult dated 10/22/12, in which the Rehabilitation Physician determined and documented that the patient's condition is appropriate for intensive rehabilitative care in an inpatient rehabilitation facility. Will admit to inpatient rehab today.  Preadmission Screen Completed By:  Clois Dupes, 10/22/2012 11:53  AM ______________________________________________________________________   Discussed status with Dr. Riley Kill on 10/22/12 at  1153 and received telephone approval for admission today.  Admission Coordinator:  Clois Dupes, time 0981 Date 10/22/12.

## 2012-10-22 NOTE — H&P (Signed)
Physical Medicine and Rehabilitation Admission H&P    Chief Complaint  Patient presents with  . Fall  : HPI: Amy Hamilton is a 62 y.o. right-handed female admitted 10/17/2012 after a fall while in the kitchen landing on her left hip without loss of consciousness. X-rays and imaging revealed a displaced left intertrochanteric hip fracture. Underwent intramedullary nail 10/18/2012 per Dr. August Saucer. Advised partial weightbearing left lower extremity. Postoperative anemia 7.9 and transfused today. Pain management as directed. Physical therapy evaluation completed 10/19/2012 with recommendations for physical medicine rehabilitation consult to consider inpatient rehabilitation services. Patient was felt to be a good candidate for inpatient rehabilitation services and was admitted for comprehensive rehabilitation program  Review of Systems  Musculoskeletal: Positive for myalgias and back pain.  Psychiatric/Behavioral: Positive for depression.  All other systems reviewed and are negative   Past Medical History  Diagnosis Date  . Thyroid disease   . Depression    Past Surgical History  Procedure Laterality Date  . Femur im nail Left 10/18/2012    Procedure: INTRAMEDULLARY (IM) NAIL FEMORAL;  Surgeon: Cammy Copa, MD;  [facility];  Service: Orthopedics;  Laterality: Left;   History reviewed. No pertinent family history. Social History:  reports that she does not drink alcohol. Her tobacco and drug histories are not on file. Allergies: No Known Allergies Medications Prior to Admission  Medication Sig Dispense Refill  . ACAI BERRY PO Take 2 tablets by mouth 2 (two) times daily. For GI clense      . FLUoxetine (PROZAC) 40 MG capsule Take 80 mg by mouth daily.      Marland Kitchen HYDROcodone-acetaminophen (NORCO) 7.5-325 MG per tablet Take 1-2 tablets by mouth every 6 (six) hours as needed for pain (not exceed 6 tablets in 24 hour period).      Marland Kitchen levothyroxine (SYNTHROID, LEVOTHROID) 25 MCG tablet Take  25 mcg by mouth daily.      Marland Kitchen lisdexamfetamine (VYVANSE) 70 MG capsule Take 70 mg by mouth every morning.      . Melatonin 5 MG SUBL Place 10 tablets under the tongue daily as needed (for sleep).      . Multiple Vitamins-Minerals (ALIVE WOMENS 50+) TABS Take 1 tablet by mouth daily.      Marland Kitchen neomycin-bacitracin-polymyxin (NEOSPORIN) ointment Apply 1 application topically every 12 (twelve) hours. For nose        Home: Home Living Lives With: Son (58 yr old son with "special problems" per pt.  ) Available Help at Discharge: Family;Available 24 hours/day Type of Home: House Home Access: Stairs to enter [employer] of Steps: 2 Entrance Stairs-Rails: None Home Layout: One level Home Adaptive Equipment: None   Functional History: Prior Function Able to Take Stairs?: Yes Driving: Yes  Functional Status:  Mobility: Bed Mobility Bed Mobility: Supine to Sit;Sitting - Scoot to Edge of Bed Supine to Sit: 3: Mod assist;HOB elevated;With rails Sitting - Scoot to Edge of Bed: 4: Min assist;With rail Transfers Transfers: Sit to Stand;Stand to [employer] Transfers Sit to Stand: 1: +2 Total assist;From bed;With upper extremity assist Sit to Stand: Patient Percentage: 50% Stand to Sit: 3: Mod assist;With upper extremity assist;To chair/3-in-1 Stand Pivot Transfers: 3: Mod assist;With armrests Ambulation/Gait Ambulation/Gait Assistance: 4: Min assist Ambulation Distance (Feet): 10 Feet Assistive device: Rolling walker Ambulation/Gait Assistance Details: verbal cues for RW management, safety, and sequencing Gait Pattern: Step-to pattern Stairs: No Wheelchair Mobility Wheelchair Mobility: No  ADL:    Cognition: Cognition Arousal/Alertness: Awake/alert Orientation Level: Oriented X4  Cognition Overall Cognitive Status: Appears within functional limits for tasks assessed/performed Arousal/Alertness: Awake/alert Orientation Level: Appears intact for tasks assessed Behavior  During Session: [facility] for tasks performed  Physical Exam: Blood pressure 116/77, pulse 80, temperature 98.5 F (36.9 C), temperature source Oral, resp. rate 16, SpO2 95.00%. Physical Exam  Vitals reviewed.  Constitutional: She is oriented to person, place, and time. She appears well-developed.  HENT: mucosa pink and moist Head: Normocephalic.  Eyes: EOM are normal.  Neck: Neck supple. No thyromegaly present. No jvd, lad Cardiovascular: Normal rate and regular rhythm. No murmurs  Pulmonary/Chest: Breath sounds normal. No respiratory distress. She has no wheezes.  Abdominal: Soft. Bowel sounds are normal. She exhibits no distension.  Musculoskeletal:  Left hip tender  Neurological: She is alert and oriented to person, place, and time. She has normal reflexes. She displays normal reflexes. No cranial nerve deficit. She exhibits normal muscle tone. Coordination normal.  Follows full commands. Left hip tender, affecting MMT. No focal motor sensory deficits noted otherwise. strenght in uppers is 5/5. Left lower is 1+ prox to 4/5 distally. RLE is 4 prox to 5/5 distally.  Skin:  Hip incision is dressed with minimal appropriate drainage.  Psychiatric: She has a normal mood and affect. Her behavior is normal. Judgment and thought content normal   Results for orders placed during the hospital encounter of 10/17/12 (from the past 48 hour(s))  CBC     Status: Abnormal   Collection Time    10/21/12  6:43 AM      Result Value Range   WBC 8.9  4.0 - 10.5 K/uL   RBC 2.58 (*) 3.87 - 5.11 MIL/uL   Hemoglobin 8.2 (*) 12.0 - 15.0 g/dL   HCT 40.9 (*) 81.1 - 91.4 %   MCV 88.0  78.0 - 100.0 fL   MCH 31.8  26.0 - 34.0 pg   MCHC 36.1 (*) 30.0 - 36.0 g/dL   RDW 78.2  95.6 - 21.3 %   Platelets 251  150 - 400 K/uL  PROTIME-INR     Status: None   Collection Time    10/21/12  6:43 AM      Result Value Range   Prothrombin Time 13.3  11.6 - 15.2 seconds   INR 1.02  0.00 - 1.49  PROTIME-INR     Status: None    Collection Time    10/22/12  5:25 AM      Result Value Range   Prothrombin Time 13.8  11.6 - 15.2 seconds   INR 1.07  0.00 - 1.49  BASIC METABOLIC PANEL     Status: Abnormal   Collection Time    10/22/12  5:25 AM      Result Value Range   Sodium 131 (*) 135 - 145 mEq/L   Potassium 3.4 (*) 3.5 - 5.1 mEq/L   Chloride 93 (*) 96 - 112 mEq/L   CO2 28  19 - 32 mEq/L   Glucose, Bld 122 (*) 70 - 99 mg/dL   BUN 8  6 - 23 mg/dL   Creatinine, Ser 0.86 (*) 0.50 - 1.10 mg/dL   Calcium 8.3 (*) 8.4 - 10.5 mg/dL   GFR calc non Af Amer >90  >90 mL/min   GFR calc Af Amer >90  >90 mL/min   Comment:            The eGFR has been calculated     using the CKD EPI equation.     This calculation has not been  validated in all clinical     situations.     eGFR's persistently     <90 mL/min signify     possible Chronic Kidney Disease.  CBC     Status: Abnormal   Collection Time    10/22/12  5:25 AM      Result Value Range   WBC 7.2  4.0 - 10.5 K/uL   RBC 2.51 (*) 3.87 - 5.11 MIL/uL   Hemoglobin 7.9 (*) 12.0 - 15.0 g/dL   HCT 45.4 (*) 09.8 - 11.9 %   MCV 86.1  78.0 - 100.0 fL   MCH 31.5  26.0 - 34.0 pg   MCHC 36.6 (*) 30.0 - 36.0 g/dL   RDW 14.7  82.9 - 56.2 %   Platelets 288  150 - 400 K/uL   Dg Femur Left  10/21/2012  *RADIOLOGY REPORT*  Clinical Data: Increased left femoral pain following insertion of a femoral nail for a fracture.  LEFT FEMUR - 2 VIEW  Comparison: 10/17/2012 and 10/18/2012.  Findings: Intramedullary nail and compression screw fixation of the previously demonstrated left intertrochanteric fracture.  Mild valgus angulation with some distraction of the fragments inferiorly.  No acute fracture or dislocation.  Diffuse osteopenia.  IMPRESSION: Hardware fixation of the previously demonstrated left intertrochanteric fracture, as described above.   Original Report Authenticated By: Beckie Salts, M.D.     Post Admission Physician Evaluation: 1. Functional deficits secondary  to  left intertrochanteric hip fx s/p IM nail. 2. Patient is admitted to receive collaborative, interdisciplinary care between the physiatrist, rehab nursing staff, and therapy team. 3. Patient's level of medical complexity and substantial therapy needs in context of that medical necessity cannot be provided at a lesser intensity of care such as a SNF. 4. Patient has experienced substantial functional loss from his/her baseline which was documented above under the "Functional History" and "Functional Status" headings.  Judging by the patient's diagnosis, physical exam, and functional history, the patient has potential for functional progress which will result in measurable gains while on inpatient rehab.  These gains will be of substantial and practical use upon discharge  in facilitating mobility and self-care at the household level. 5. Physiatrist will provide 24 hour management of medical needs as well as oversight of the therapy plan/treatment and provide guidance as appropriate regarding the interaction of the two. 6. 24 hour rehab nursing will assist with bladder management, bowel management, safety, skin/wound care, disease management, medication administration, pain management and patient education  and help integrate therapy concepts, techniques,education, etc. 7. PT will assess and treat for/with: Lower extremity strength, range of motion, stamina, balance, functional mobility, safety, adaptive techniques and equipment, ortho precautions, pain mg.   Goals are: mod I. 8. OT will assess and treat for/with: ADL's, functional mobility, safety, upper extremity strength, adaptive techniques and equipment, ortho precautions, pain mgt, education.   Goals are: mod I. 9. SLP will assess and treat for/with: n/a.  Goals are: n/a. 10. Case Management and Social Worker will assess and treat for psychological issues and discharge planning. 11. Team conference will be held weekly to assess progress toward goals and  to determine barriers to discharge. 12. Patient will receive at least 3 hours of therapy per day at least 5 days per week. 13. ELOS: 7-10 days      Prognosis:  excellent   Medical Problem List and Plan: 1. Left intertrochanteric hip fracture. Status post intramedullary nail 10/18/2012 2. DVT Prophylaxis/Anticoagulation: Discuss adding Lovenox for DVT prophylaxis. Check  vascular studies 3. Pain Management: Oxycodone and Robaxin as needed. Monitor with increased activity 4. Mood: Vyvanse 70 mg daily, Prozac 40 mg daily. Provide emotional support and positive reinforcement 5. Neuropsych: This patient is capable of making decisions on his/her own behalf. 6. Postoperative anemia. And iron supplement followup CBC after transfusion 7. Hypothyroidism. Synthroid 8. Tobacco abuse. NicoDerm patch. Provide counseling 9. Hypokalemia and hyponatremia. May be stress related. Will recheck labs upon admit. Supplement and adjust medical plan as needed.   Ranelle Oyster, MD, [place] [facility]  10/22/2012

## 2012-10-22 NOTE — Plan of Care (Signed)
Overall Plan of Care [facility]) Patient Details Name: Amy Hamilton MRN: [id number] DOB: 1951/04/07  Diagnosis:  Left IT hip fx s/p ORIF  Co-morbidities: ABLA, hyponatremia, hypokalemia  Functional Problem List  Patient demonstrates impairments in the following areas: Balance, Bladder, Bowel, Edema, Endurance, Medication Management, Nutrition, Pain, Safety, Sensory  and Skin Integrity  Basic ADL's: grooming, bathing, dressing and toileting Advanced ADL's: simple meal preparation  Transfers:  bed mobility, bed to chair, toilet, tub/shower, car and furniture Locomotion:  ambulation, wheelchair mobility and stairs  Additional Impairments:  Leisure Awareness  Anticipated Outcomes Item Anticipated Outcome  Eating/Swallowing  independent  Basic self-care  Mod I  Tolieting  Mod I  Bowel/Bladder  Continent with toileting with mod independent  Transfers  Mod I basic transfers; S for car  Locomotion  Mod I shorter distance gait; mod I w/c mobility longer distance; min A stairs  Communication    Cognition    Pain  3 or less on scale of 1-10  Safety/Judgment  supervision  Other     Therapy Plan: PT Intensity: Minimum of 1-2 x/day ,45 to 90 minutes PT Frequency: 5 out of 7 days PT Duration Estimated Length of Stay: 7 days OT Intensity: Minimum of 1-2 x/day, 45 to 90 minutes OT Frequency: 5 out of 7 days OT Duration/Estimated Length of Stay: 5-7 days      Team Interventions: Item RN PT OT SLP SW TR Other  Self Care/Advanced ADL Retraining  x x      Neuromuscular Re-Education  x x      Therapeutic Activities  x x      UE/LE Strength Training/ROM  x x      UE/LE Coordination Activities  x x      Visual/Perceptual Remediation/Compensation         DME/Adaptive Equipment Instruction  x x      Therapeutic Exercise  x x      Balance/Vestibular Training  x x      Patient/Family Education x x x      Cognitive Remediation/Compensation         Functional Mobility Training  x x       Ambulation/Gait Training  x x      Stair Training  x       Wheelchair Propulsion/Positioning  x x      Functional Statistician  x       Community Reintegration  x x      Dysphagia/Aspiration Film/video editor         Bladder Management x        Bowel Management x        Disease Management/Prevention x        Pain Management x x x      Medication Management x        Skin Care/Wound Management x x x      Splinting/Orthotics  x x      Discharge Planning x x x      Psychosocial Support x x x                             Team Discharge Planning: Destination: PT-Home ,OT- Home , SLP-  Projected Follow-up: PT-Home health PT, OT-  None, SLP-  Projected Equipment Needs: PT-Rolling walker with 5" wheels;Wheelchair (measurements);Wheelchair cushion (measurements), OT- 3 in 1 bedside comode;Tub/shower bench, SLP-  Patient/family  involved in discharge planning: PT- Patient,  OT-Patient, SLP-   MD ELOS: 7 days Medical Rehab Prognosis:  Excellent Assessment: The patient has been admitted for CIR therapies. The team will be addressing, functional mobility, strength, stamina, balance, safety, adaptive techniques/equipment, self-care, bowel and bladder mgt, patient and caregiver education, pain mgt, ortho precautions. Goals have been set at mod I for mobility and self care. Pt is motivated!    Ranelle Oyster, MD, FAAPMR      See Team Conference Notes for weekly updates to the plan of care

## 2012-10-22 NOTE — Care Management Note (Signed)
CARE MANAGEMENT NOTE 10/22/2012  Patient:  Amy Hamilton, Amy Hamilton   Account Number:  [id number]  Date Initiated:  10/18/2012  Documentation initiated by:  Vance Peper  Subjective/Objective Assessment:   62 yr old female adm for left hip fracture. OR scheduled for today, waiting for transfusion of PRBC's to be started.     Action/Plan:   CM will follow after PT/OT eval patient.  10/22/12 Patient will transfer to CIR.   Anticipated DC Date:  10/22/2012   Anticipated DC Plan:  IP REHAB FACILITY      DC Planning Services  CM consult      Choice offered to / List presented to:             Status of service:  Completed, signed off Medicare Important Message given?   (If response is "NO", the following Medicare IM given date fields will be blank) Date Medicare IM given:   Date Additional Medicare IM given:    Discharge Disposition:  IP REHAB FACILITY  Per UR Regulation:    If discussed at Long Length of Stay Meetings, dates discussed:    Comments:

## 2012-10-22 NOTE — Discharge Summary (Signed)
Physician Discharge Summary  Patient ID: EMIE SOMMERFELD MRN: [id number] DOB/AGE: 1950/12/31 62 y.o.  Admit date: 10/17/2012 Discharge date: 10/22/2012  Primary Care Physician:  Kaleen Mask, MD  Discharge Diagnoses:     Left hip fracture satus post intramedullary nail on 10/18/2012 . Hypothyroidism . Depression . Back pain Constipation Acute blood loss anemia/postop anemia status post transfusion, 1 unit, 10/22/2012  Consults:  Orthopedics, Dr. August Saucer                     Inpatient rehabilitation   Discharge Medications:   Medication List    TAKE these medications       ACAI BERRY PO  Take 2 tablets by mouth 2 (two) times daily. For GI clense     ALIVE WOMENS 50+ Tabs  Take 1 tablet by mouth daily.     aspirin 325 MG EC tablet  Take 1 tablet (325 mg total) by mouth daily with breakfast.     FLUoxetine 40 MG capsule  Commonly known as:  PROZAC  Take 80 mg by mouth daily.     HYDROcodone-acetaminophen 7.5-325 MG per tablet  Commonly known as:  NORCO  Take 1-2 tablets by mouth every 6 (six) hours as needed for pain (not exceed 6 tablets in 24 hour period).     levothyroxine 25 MCG tablet  Commonly known as:  SYNTHROID, LEVOTHROID  Take 25 mcg by mouth daily.     lisdexamfetamine 70 MG capsule  Commonly known as:  VYVANSE  Take 70 mg by mouth every morning.     Melatonin 5 MG Subl  Place 10 tablets under the tongue daily as needed (for sleep).     methocarbamol 500 MG tablet  Commonly known as:  ROBAXIN  Take 1 tablet (500 mg total) by mouth every 6 (six) hours as needed.     Oxycodone HCl 10 MG Tabs  Take 1 tablet (10 mg total) by mouth every 3 (three) hours as needed.      ASK your doctor about these medications       neomycin-bacitracin-polymyxin ointment  Commonly known as:  NEOSPORIN  Apply 1 application topically every 12 (twelve) hours. For nose         Brief H and P: For complete details please refer to admission H and P, but in brief  Patient is a 62 year old female with past medical problems of hypothyroidism and depression presented to ED that the left hip pain. Patient stated that she was making jello jigglers for the grand-kids when it spilled on the floor. In the process of cleaning the Jello, she slipped on the floor and fell on her left hip. She may have hit her head on the refrigerator. There was no dizziness lightheadedness or any syncopal episode. She otherwise lives at home with her son and is very functional with her ADLs.  Multiple x-rays in the ED showed a mildly displaced left intertrochanteric fracture of the left hip. During examination patient also reported low back pain similar to her previous back pain when she had the back surgery in 2008.   Hospital Course:   Hip fracture, left secondary to mechanical fall: patient was admitted to medical service and orthopedics was consulted. Patient underwents/p intramedullary nail on 10/18/2012, postop day 4. Per orthopedics, continue pain control and aspirin for DVT prophylaxis. CIR Inpatient rehabilitation consult was obtained and patient is accepted for rehabilitation.   Acute blood loss anemia/postop anemia - hemoglobin drifted down to 7.9, hence patient will  be transfused one unit packed RBCs prior to discharge to inpatient-rehab.  Hypothyroidism - continue Synthroid   Depression - stable, on Prozac   Back pain - Lumbar spine x-ray was negative for any compression fracture   Constipation:  - On MiraLax, Colace, Dulcolax supp   Day of Discharge BP 116/63  Pulse 92  Temp(Src) 98.7 F (37.1 C) (Oral)  Resp 16  SpO2 95%  Physical Exam: General: Alert and awake oriented x3 not in any acute distress. CVS: S1-S2 clear no murmur rubs or gallops Chest: clear to auscultation bilaterally, no wheezing rales or rhonchi Abdomen: soft nontender, nondistended, normal bowel sounds, Extremities: no cyanosis, clubbing or edema noted bilaterally    The results of  significant diagnostics from this hospitalization (including imaging, microbiology, ancillary and laboratory) are listed below for reference.    LAB RESULTS: Basic Metabolic Panel:  Recent Labs Lab 10/20/12 0640 10/22/12 0525  NA 129* 131*  K 4.0 3.4*  CL 94* 93*  CO2 27 28  GLUCOSE 125* 122*  BUN 6 8  CREATININE 0.45* 0.42*  CALCIUM 8.3* 8.3*   Liver Function Tests:  Recent Labs Lab 10/17/12 2137  ALBUMIN 3.3*   No results found for this basename: LIPASE, AMYLASE,  in the last 168 hours No results found for this basename: AMMONIA,  in the last 168 hours CBC:  Recent Labs Lab 10/17/12 1540  10/21/12 0643 10/22/12 0525  WBC 7.2  < > 8.9 7.2  NEUTROABS 4.7  --   --   --   HGB 11.1*  < > 8.2* 7.9*  HCT 32.0*  < > 22.7* 21.6*  MCV 89.6  < > 88.0 86.1  PLT 312  < > 251 288  < > = values in this interval not displayed.  Significant Diagnostic Studies:  Dg Chest 1 View  10/17/2012  *RADIOLOGY REPORT*  Clinical Data: Larey Seat.  Left hip pain.  CHEST - 1 VIEW  Comparison: 02/18/2007.  Findings: The cardiac silhouette, mediastinal and hilar contours are normal and stable.  There is mild tortuosity of the thoracic aorta and part due to thoracolumbar scoliosis.  The lungs are clear.  Mild stable emphysematous changes.  No pleural effusion. The bony thorax is intact.  IMPRESSION: No acute cardiopulmonary findings.   Original Report Authenticated By: Rudie Meyer, M.D.    Dg Lumbar Spine 2-3 Views  10/17/2012  *RADIOLOGY REPORT*  Clinical Data: Pain.  Left intertrochanteric hip fracture diagnosed earlier today.  LUMBAR SPINE - 2-3 VIEW  Comparison: CT abdomen/pelvis 10/17/2006.  Findings: Imaging is suboptimal due to the patient's inability to be properly positioned due to pain from previously diagnosed fracture.  Rightward scoliosis of the lumbar spine centered at L3 noted.  Multilevel disc degenerative change is identified, most prominent at L4-L5 and L5-S1.  Aortic calcification noted  with stable visualized maximal diameter of 2.0 cm in anteroposterior dimension, although not optimally imaged on today's exam.  IMPRESSION: Lumbar spine degenerative change.  No compression deformity identified.   Original Report Authenticated By: Christiana Pellant, M.D.    Dg Hip Complete Left  10/17/2012  *RADIOLOGY REPORT*  Clinical Data: Larey Seat.  Left hip pain.  LEFT HIP - COMPLETE 2+ VIEW  Comparison: None  Findings: There is an intertrochanteric fracture of the left hip with mild displacement and a varus deformity.  The pubic symphysis and SI joints are intact.  The right hip appears normal.  IMPRESSION: Mildly displaced intertrochanteric fracture left hip.   Original Report Authenticated By: Rudie Meyer,  M.D.    Dg Femur Left  10/18/2012  *RADIOLOGY REPORT*  Clinical Data: Left femoral nail placement.  LEFT FEMUR - 2 VIEW,DG C-ARM 61-120 MIN  Comparison: 10/17/2012  Findings: A series of three fluoroscopic spot images demonstrates an intertrochanteric fracture, a cross-table lateral view of the a hip screw and intramedullary nail traversing this fracture site, and a spot image of the distal intramedullary nail stem.  No complicating features readily apparent.  IMPRESSION:  1.  ORIF of left intertrochanteric fracture, without complicating feature apparent.   Original Report Authenticated By: Gaylyn Rong, M.D.    Dg Knee 1-2 Views Left  10/17/2012  *RADIOLOGY REPORT*  Clinical Data: Larey Seat.  Left knee pain.  LEFT KNEE - 1-2 VIEW  Comparison: None  Findings: The joint spaces are maintained.  No acute fracture or joint effusion.  IMPRESSION: No acute bony findings.   Original Report Authenticated By: Rudie Meyer, M.D.    Dg Pelvis Portable  10/18/2012  *RADIOLOGY REPORT*  Clinical Data: Femur fracture, post internal fixation  PORTABLE PELVIS  Comparison:   the previous day's study  Findings: IM rod and sliding screw transfix the left intertrochanteric fracture.  There is approximately 13 mm  distraction of the fracture fragments at the level of the lesser trochanter.  IMPRESSION:  Internal fixation of a left intertrochanteric femur fracture   Original Report Authenticated By: D. Andria Rhein, MD    Dg C-arm 61-120 Min  10/18/2012  *RADIOLOGY REPORT*  Clinical Data: Left femoral nail placement.  LEFT FEMUR - 2 VIEW,DG C-ARM 61-120 MIN  Comparison: 10/17/2012  Findings: A series of three fluoroscopic spot images demonstrates an intertrochanteric fracture, a cross-table lateral view of the a hip screw and intramedullary nail traversing this fracture site, and a spot image of the distal intramedullary nail stem.  No complicating features readily apparent.  IMPRESSION:  1.  ORIF of left intertrochanteric fracture, without complicating feature apparent.   Original Report Authenticated By: Gaylyn Rong, M.D.     2D ECHO:   Disposition and Follow-up:    DISPOSITION: inpatient rehabilitation DIET:heart healthy diet ACTIVITY: as tolerated   DISCHARGE FOLLOW-UP     Follow-up Information   Follow up with Cammy Copa, MD In 2 weeks.   Contact information:   [address] [place] [address] [phone]       Time spent on Discharge: 38 mins Signed:   Lindsie Simar M.D. [facility] 10/22/2012, 12:43 PM Pager: [phone]

## 2012-10-22 NOTE — H&P (View-Only) (Signed)
Physical Medicine and Rehabilitation Admission H&P    Chief Complaint  Patient presents with  . Fall  : HPI: Amy Hamilton is a 62 y.o. right-handed female admitted 10/17/2012 after a fall while in the kitchen landing on her left hip without loss of consciousness. X-rays and imaging revealed a displaced left intertrochanteric hip fracture. Underwent intramedullary nail 10/18/2012 per Dr. Dean. Advised partial weightbearing left lower extremity. Postoperative anemia 7.9 and transfused today. Pain management as directed. Physical therapy evaluation completed 10/19/2012 with recommendations for physical medicine rehabilitation consult to consider inpatient rehabilitation services. Patient was felt to be a good candidate for inpatient rehabilitation services and was admitted for comprehensive rehabilitation program  Review of Systems  Musculoskeletal: Positive for myalgias and back pain.  Psychiatric/Behavioral: Positive for depression.  All other systems reviewed and are negative   Past Medical History  Diagnosis Date  . Thyroid disease   . Depression    Past Surgical History  Procedure Laterality Date  . Femur im nail Left 10/18/2012    Procedure: INTRAMEDULLARY (IM) NAIL FEMORAL;  Surgeon: Gregory Scott Dean, MD;  Location: [facility] OR;  Service: Orthopedics;  Laterality: Left;   History reviewed. No pertinent family history. Social History:  reports that she does not drink alcohol. Her tobacco and drug histories are not on file. Allergies: No Known Allergies Medications Prior to Admission  Medication Sig Dispense Refill  . ACAI BERRY PO Take 2 tablets by mouth 2 (two) times daily. For GI clense      . FLUoxetine (PROZAC) 40 MG capsule Take 80 mg by mouth daily.      . HYDROcodone-acetaminophen (NORCO) 7.5-325 MG per tablet Take 1-2 tablets by mouth every 6 (six) hours as needed for pain (not exceed 6 tablets in 24 hour period).      . levothyroxine (SYNTHROID, LEVOTHROID) 25 MCG tablet Take  25 mcg by mouth daily.      . lisdexamfetamine (VYVANSE) 70 MG capsule Take 70 mg by mouth every morning.      . Melatonin 5 MG SUBL Place 10 tablets under the tongue daily as needed (for sleep).      . Multiple Vitamins-Minerals (ALIVE WOMENS 50+) TABS Take 1 tablet by mouth daily.      . neomycin-bacitracin-polymyxin (NEOSPORIN) ointment Apply 1 application topically every 12 (twelve) hours. For nose        Home: Home Living Lives With: Son (20 yr old son with "special problems" per pt.  ) Available Help at Discharge: Family;Available 24 hours/day Type of Home: House Home Access: Stairs to enter Entrance Stairs-Number of Steps: 2 Entrance Stairs-Rails: None Home Layout: One level Home Adaptive Equipment: None   Functional History: Prior Function Able to Take Stairs?: Yes Driving: Yes  Functional Status:  Mobility: Bed Mobility Bed Mobility: Supine to Sit;Sitting - Scoot to Edge of Bed Supine to Sit: 3: Mod assist;HOB elevated;With rails Sitting - Scoot to Edge of Bed: 4: Min assist;With rail Transfers Transfers: Sit to Stand;Stand to Sit;Stand Pivot Transfers Sit to Stand: 1: +2 Total assist;From bed;With upper extremity assist Sit to Stand: Patient Percentage: 50% Stand to Sit: 3: Mod assist;With upper extremity assist;To chair/3-in-1 Stand Pivot Transfers: 3: Mod assist;With armrests Ambulation/Gait Ambulation/Gait Assistance: 4: Min assist Ambulation Distance (Feet): 10 Feet Assistive device: Rolling walker Ambulation/Gait Assistance Details: verbal cues for RW management, safety, and sequencing Gait Pattern: Step-to pattern Stairs: No Wheelchair Mobility Wheelchair Mobility: No  ADL:    Cognition: Cognition Arousal/Alertness: Awake/alert Orientation Level: Oriented X4   Cognition Overall Cognitive Status: Appears within functional limits for tasks assessed/performed Arousal/Alertness: Awake/alert Orientation Level: Appears intact for tasks assessed Behavior  During Session: WFL for tasks performed  Physical Exam: Blood pressure 116/77, pulse 80, temperature 98.5 F (36.9 C), temperature source Oral, resp. rate 16, SpO2 95.00%. Physical Exam  Vitals reviewed.  Constitutional: She is oriented to person, place, and time. She appears well-developed.  HENT: mucosa pink and moist Head: Normocephalic.  Eyes: EOM are normal.  Neck: Neck supple. No thyromegaly present. No jvd, lad Cardiovascular: Normal rate and regular rhythm. No murmurs  Pulmonary/Chest: Breath sounds normal. No respiratory distress. She has no wheezes.  Abdominal: Soft. Bowel sounds are normal. She exhibits no distension.  Musculoskeletal:  Left hip tender  Neurological: She is alert and oriented to person, place, and time. She has normal reflexes. She displays normal reflexes. No cranial nerve deficit. She exhibits normal muscle tone. Coordination normal.  Follows full commands. Left hip tender, affecting MMT. No focal motor sensory deficits noted otherwise. strenght in uppers is 5/5. Left lower is 1+ prox to 4/5 distally. RLE is 4 prox to 5/5 distally.  Skin:  Hip incision is dressed with minimal appropriate drainage.  Psychiatric: She has a normal mood and affect. Her behavior is normal. Judgment and thought content normal   Results for orders placed during the hospital encounter of 10/17/12 (from the past 48 hour(s))  CBC     Status: Abnormal   Collection Time    10/21/12  6:43 AM      Result Value Range   WBC 8.9  4.0 - 10.5 K/uL   RBC 2.58 (*) 3.87 - 5.11 MIL/uL   Hemoglobin 8.2 (*) 12.0 - 15.0 g/dL   HCT 22.7 (*) 36.0 - 46.0 %   MCV 88.0  78.0 - 100.0 fL   MCH 31.8  26.0 - 34.0 pg   MCHC 36.1 (*) 30.0 - 36.0 g/dL   RDW 11.9  11.5 - 15.5 %   Platelets 251  150 - 400 K/uL  PROTIME-INR     Status: None   Collection Time    10/21/12  6:43 AM      Result Value Range   Prothrombin Time 13.3  11.6 - 15.2 seconds   INR 1.02  0.00 - 1.49  PROTIME-INR     Status: None    Collection Time    10/22/12  5:25 AM      Result Value Range   Prothrombin Time 13.8  11.6 - 15.2 seconds   INR 1.07  0.00 - 1.49  BASIC METABOLIC PANEL     Status: Abnormal   Collection Time    10/22/12  5:25 AM      Result Value Range   Sodium 131 (*) 135 - 145 mEq/L   Potassium 3.4 (*) 3.5 - 5.1 mEq/L   Chloride 93 (*) 96 - 112 mEq/L   CO2 28  19 - 32 mEq/L   Glucose, Bld 122 (*) 70 - 99 mg/dL   BUN 8  6 - 23 mg/dL   Creatinine, Ser 0.42 (*) 0.50 - 1.10 mg/dL   Calcium 8.3 (*) 8.4 - 10.5 mg/dL   GFR calc non Af Amer >90  >90 mL/min   GFR calc Af Amer >90  >90 mL/min   Comment:            The eGFR has been calculated     using the CKD EPI equation.     This calculation has not been       validated in all clinical     situations.     eGFR's persistently     <90 mL/min signify     possible Chronic Kidney Disease.  CBC     Status: Abnormal   Collection Time    10/22/12  5:25 AM      Result Value Range   WBC 7.2  4.0 - 10.5 K/uL   RBC 2.51 (*) 3.87 - 5.11 MIL/uL   Hemoglobin 7.9 (*) 12.0 - 15.0 g/dL   HCT 21.6 (*) 36.0 - 46.0 %   MCV 86.1  78.0 - 100.0 fL   MCH 31.5  26.0 - 34.0 pg   MCHC 36.6 (*) 30.0 - 36.0 g/dL   RDW 11.9  11.5 - 15.5 %   Platelets 288  150 - 400 K/uL   Dg Femur Left  10/21/2012  *RADIOLOGY REPORT*  Clinical Data: Increased left femoral pain following insertion of a femoral nail for a fracture.  LEFT FEMUR - 2 VIEW  Comparison: 10/17/2012 and 10/18/2012.  Findings: Intramedullary nail and compression screw fixation of the previously demonstrated left intertrochanteric fracture.  Mild valgus angulation with some distraction of the fragments inferiorly.  No acute fracture or dislocation.  Diffuse osteopenia.  IMPRESSION: Hardware fixation of the previously demonstrated left intertrochanteric fracture, as described above.   Original Report Authenticated By: Steven Reid, M.D.     Post Admission Physician Evaluation: 1. Functional deficits secondary  to  left intertrochanteric hip fx s/p IM nail. 2. Patient is admitted to receive collaborative, interdisciplinary care between the physiatrist, rehab nursing staff, and therapy team. 3. Patient's level of medical complexity and substantial therapy needs in context of that medical necessity cannot be provided at a lesser intensity of care such as a SNF. 4. Patient has experienced substantial functional loss from his/her baseline which was documented above under the "Functional History" and "Functional Status" headings.  Judging by the patient's diagnosis, physical exam, and functional history, the patient has potential for functional progress which will result in measurable gains while on inpatient rehab.  These gains will be of substantial and practical use upon discharge  in facilitating mobility and self-care at the household level. 5. Physiatrist will provide 24 hour management of medical needs as well as oversight of the therapy plan/treatment and provide guidance as appropriate regarding the interaction of the two. 6. 24 hour rehab nursing will assist with bladder management, bowel management, safety, skin/wound care, disease management, medication administration, pain management and patient education  and help integrate therapy concepts, techniques,education, etc. 7. PT will assess and treat for/with: Lower extremity strength, range of motion, stamina, balance, functional mobility, safety, adaptive techniques and equipment, ortho precautions, pain mg.   Goals are: mod I. 8. OT will assess and treat for/with: ADL's, functional mobility, safety, upper extremity strength, adaptive techniques and equipment, ortho precautions, pain mgt, education.   Goals are: mod I. 9. SLP will assess and treat for/with: n/a.  Goals are: n/a. 10. Case Management and Social Worker will assess and treat for psychological issues and discharge planning. 11. Team conference will be held weekly to assess progress toward goals and  to determine barriers to discharge. 12. Patient will receive at least 3 hours of therapy per day at least 5 days per week. 13. ELOS: 7-10 days      Prognosis:  excellent   Medical Problem List and Plan: 1. Left intertrochanteric hip fracture. Status post intramedullary nail 10/18/2012 2. DVT Prophylaxis/Anticoagulation: Discuss adding Lovenox for DVT prophylaxis. Check   vascular studies 3. Pain Management: Oxycodone and Robaxin as needed. Monitor with increased activity 4. Mood: Vyvanse 70 mg daily, Prozac 40 mg daily. Provide emotional support and positive reinforcement 5. Neuropsych: This patient is capable of making decisions on his/her own behalf. 6. Postoperative anemia. And iron supplement followup CBC after transfusion 7. Hypothyroidism. Synthroid 8. Tobacco abuse. NicoDerm patch. Provide counseling 9. Hypokalemia and hyponatremia. May be stress related. Will recheck labs upon admit. Supplement and adjust medical plan as needed.   Forestine Macho T. Jayra Choyce, MD, FAAPMR  10/22/2012 

## 2012-10-22 NOTE — Progress Notes (Signed)
PT Cancellation Note  Patient Details Name: Amy Hamilton MRN: [id number] DOB: February 07, 1951   Cancelled Treatment:    Reason Eval/Treat Not Completed: Medical issues which prohibited therapy. Patient Hgb is 7.9. Explained that we could attempt therapy however at this time patient states she is dizzy from getting OOB and to the recliner. Will hold PT at this time and will check on patient later today as time allows  Thanks 10/22/2012 Fredrich Birks PTA [phone] pager [phone] office      Fredrich Birks 10/22/2012, 10:38 AM

## 2012-10-22 NOTE — Progress Notes (Signed)
Patient admitted to 4006 via bed. Oriented to room, callight, general education booklet,  Safety plan, agreement, video. White board, Chiropodist, team conference, meal time, team conference, CMS data sheet, preferred name and personal rehab goal posted at [facility], discussed weight bearing precautions. Roberts-[place]on[place], Amy Hamilton

## 2012-10-23 ENCOUNTER — Inpatient Hospital Stay (HOSPITAL_COMMUNITY): Payer: Medicaid Other

## 2012-10-23 ENCOUNTER — Inpatient Hospital Stay (HOSPITAL_COMMUNITY): Payer: Medicaid Other | Admitting: Physical Therapy

## 2012-10-23 ENCOUNTER — Inpatient Hospital Stay (HOSPITAL_COMMUNITY): Payer: Medicaid Other | Admitting: Occupational Therapy

## 2012-10-23 DIAGNOSIS — S72142A Displaced intertrochanteric fracture of left femur, initial encounter for closed fracture: Secondary | ICD-10-CM

## 2012-10-23 DIAGNOSIS — W19XXXA Unspecified fall, initial encounter: Secondary | ICD-10-CM

## 2012-10-23 DIAGNOSIS — M7989 Other specified soft tissue disorders: Secondary | ICD-10-CM

## 2012-10-23 DIAGNOSIS — E871 Hypo-osmolality and hyponatremia: Secondary | ICD-10-CM

## 2012-10-23 DIAGNOSIS — D62 Acute posthemorrhagic anemia: Secondary | ICD-10-CM

## 2012-10-23 DIAGNOSIS — S72143A Displaced intertrochanteric fracture of unspecified femur, initial encounter for closed fracture: Secondary | ICD-10-CM

## 2012-10-23 HISTORY — DX: Displaced intertrochanteric fracture of left femur, initial encounter for closed fracture: S72.142A

## 2012-10-23 LAB — COMPREHENSIVE METABOLIC PANEL
ALT: 18 U/L (ref 0–35)
AST: 22 U/L (ref 0–37)
Albumin: 2.5 g/dL — ABNORMAL LOW (ref 3.5–5.2)
Alkaline Phosphatase: 81 U/L (ref 39–117)
Chloride: 93 mEq/L — ABNORMAL LOW (ref 96–112)
Potassium: 3.6 mEq/L (ref 3.5–5.1)
Sodium: 132 mEq/L — ABNORMAL LOW (ref 135–145)
Total Bilirubin: 0.6 mg/dL (ref 0.3–1.2)
Total Protein: 5.9 g/dL — ABNORMAL LOW (ref 6.0–8.3)

## 2012-10-23 LAB — TYPE AND SCREEN
ABO/RH(D): O POS
Antibody Screen: NEGATIVE
Unit division: 0

## 2012-10-23 LAB — CBC WITH DIFFERENTIAL/PLATELET
Basophils Absolute: 0 10*3/uL (ref 0.0–0.1)
Basophils Relative: 0 % (ref 0–1)
Eosinophils Absolute: 0.3 10*3/uL (ref 0.0–0.7)
Hemoglobin: 8.9 g/dL — ABNORMAL LOW (ref 12.0–15.0)
MCHC: 36.5 g/dL — ABNORMAL HIGH (ref 30.0–36.0)
Monocytes Relative: 13 % — ABNORMAL HIGH (ref 3–12)
Neutro Abs: 2.9 10*3/uL (ref 1.7–7.7)
Neutrophils Relative %: 58 % (ref 43–77)
Platelets: 344 10*3/uL (ref 150–400)
RDW: 13.7 % (ref 11.5–15.5)

## 2012-10-23 MED ORDER — PANTOPRAZOLE SODIUM 40 MG PO TBEC
40.0000 mg | DELAYED_RELEASE_TABLET | Freq: Every day | ORAL | Status: DC
  Administered 2012-10-23 – 2012-10-27 (×5): 40 mg via ORAL
  Filled 2012-10-23 (×5): qty 1

## 2012-10-23 MED ORDER — WHITE PETROLATUM GEL
Status: AC
  Administered 2012-10-23: 18:00:00
  Filled 2012-10-23: qty 5

## 2012-10-23 MED ORDER — METHOCARBAMOL 500 MG PO TABS
500.0000 mg | ORAL_TABLET | Freq: Four times a day (QID) | ORAL | Status: DC
  Administered 2012-10-23 – 2012-10-27 (×18): 500 mg via ORAL
  Filled 2012-10-23 (×25): qty 1

## 2012-10-23 MED ORDER — ALUM & MAG HYDROXIDE-SIMETH 200-200-20 MG/5ML PO SUSP
30.0000 mL | Freq: Four times a day (QID) | ORAL | Status: DC | PRN
  Administered 2012-10-23: 30 mL via ORAL
  Filled 2012-10-23: qty 30

## 2012-10-23 NOTE — Evaluation (Signed)
Physical Therapy Assessment and Plan  Patient Details  Name: Amy Hamilton MRN: [id number] Date of Birth: [birth year]  PT Diagnosis: Abnormality of gait, Difficulty walking, Edema, Impaired sensation, Muscle spasms, Muscle weakness and Pain in joint Rehab Potential: Good ELOS: 7 days   Today's Date: 10/23/2012 Time: 0930-1028 Time Calculation (min): 58 min  Problem List:  Patient Active Problem List  Diagnosis  . Hip fracture, left  . Fall at home  . Hypothyroidism  . Depression  . Back pain  . Intertrochanteric fracture of left hip    Past Medical History:  Past Medical History  Diagnosis Date  . Thyroid disease   . Depression    Past Surgical History:  Past Surgical History  Procedure Laterality Date  . Femur im nail Left 10/18/2012    Procedure: INTRAMEDULLARY (IM) NAIL FEMORAL;  Surgeon: Cammy Copa, MD;  Location: [facility] OR;  Service: Orthopedics;  Laterality: Left;    Assessment & Plan Clinical Impression: Patient is a 62 y.o. year old right-handed female admitted 10/17/2012 after a fall while in the kitchen landing on her left hip without loss of consciousness. X-rays and imaging revealed a displaced left intertrochanteric hip fracture. Underwent intramedullary nail 10/18/2012 per Dr. August Saucer. Advised partial weightbearing left lower extremity. Postoperative anemia 7.9 and transfused yesterday. Pain management as directed. Patient transferred to CIR on 10/22/2012 .   Patient currently requires min with mobility secondary to muscle weakness and muscle joint tightness, decreased cardiorespiratoy endurance and decreased standing balance, decreased balance strategies and difficulty maintaining precautions.  Prior to hospitalization, patient was independent  with mobility and lived with Amy Hamilton (62 yr old with special needs (mentally like a 61 yo per pt)) in a House.  Home access is 2Stairs to enter.  Patient will benefit from skilled PT intervention to maximize safe functional  mobility, minimize fall risk and decrease caregiver burden for planned discharge home with intermittent assist. Amy Hamilton is planning to stay with pt temporarily to assist her and her Amy Hamilton as needed (grandson is 31 years old).  Anticipate patient will benefit from follow up HH at discharge.  PT - End of Session Activity Tolerance: Decreased this session Endurance Deficit: Yes PT Assessment Rehab Potential: Good Barriers to Discharge: Decreased caregiver support;Inaccessible home environment (will need assist for home entry; grandson planning to stay) PT Plan PT Intensity: Minimum of 1-2 x/day ,45 to 90 minutes PT Frequency: 5 out of 7 days PT Duration Estimated Length of Stay: 7 days PT Treatment/Interventions: Ambulation/gait training;Balance/vestibular training;Discharge planning;Community reintegration;DME/adaptive equipment instruction;Functional electrical stimulation;Functional mobility training;Neuromuscular re-education;Patient/family education;Pain management;Psychosocial support;Skin care/wound management;Splinting/orthotics;Stair training;Therapeutic Activities;Therapeutic Exercise;UE/LE Coordination activities;UE/LE Strength taining/ROM;Wheelchair propulsion/positioning PT Recommendation Follow Up Recommendations: Home health PT Patient destination: Home Equipment Recommended: Rolling walker with 5" wheels;Wheelchair (measurements);Wheelchair cushion (measurements)  Skilled Therapeutic Intervention Individual treatment focused on gait training, transfers with RW, bed mobility on flat surface with steady A for LLE (may benefit from use of leg lifter), dynamic standing balance in order to dress before therapy session, w/c propulsion for endurance and strengthening, and introduced a few hip exercises during rest breaks. Pt with report of nausea after gait trials; ginger ale given and RN notified.   PT Evaluation Precautions/Restrictions Precautions Precautions: Fall Restrictions LLE  Weight Bearing: Partial weight bearing LLE Partial Weight Bearing Percentage or Pounds: 10% Pain  Reports pain in L hip with movement - premedicated. Home Living/Prior Functioning Home Living Lives With: Amy Hamilton (62 yr old with special needs (mentally like a 61 yo per pt)) Available Help  at Discharge: Available PRN/intermittently (grandson will be coming to stay with pt temporarily) Type of Home: House Home Access: Stairs to enter [employer] of Steps: 2 Entrance Stairs-Rails: None Home Layout: Laundry or work area in basement;One level How Accessible: Accessible via walker;Accessible via wheelchair (bathroom not w/c accessible) Home Adaptive Equipment: None Prior Function Vocation: On disability Vision/Perception  Perception Perception: Within Functional Limits Praxis Praxis: Intact  Cognition Overall Cognitive Status: Appears within functional limits for tasks assessed Orientation Level: Oriented X4 Safety/Judgment: Appears intact Comments: appears somewhat anxious personality Sensation Sensation Light Touch: Impaired Detail Light Touch Impaired Details: Impaired LLE (reports numbness intermittently) Proprioception: Appears Intact Coordination Gross Motor Movements are Fluid and Coordinated: Yes Motor  Motor Motor: Within Functional Limits    Locomotion  Ambulation Ambulation: Yes Ambulation/Gait Assistance: 4: Min guard Assistive device: Rolling walker Gait Gait Pattern: Step-to pattern;Cabin crew: Yes Wheelchair Assistance: 5: Supervision  Trunk/Postural Assessment  Cervical Assessment Cervical Assessment: Within Functional Limits Thoracic Assessment Thoracic Assessment: Within Functional Limits Lumbar Assessment Lumbar Assessment: Within Functional Limits  Balance Balance Balance Assessed: Yes Static Sitting Balance Static Sitting - Level of Assistance: 7: Independent Dynamic Sitting Balance Dynamic Sitting  - Level of Assistance: 6: Modified independent (Device/Increase time) Static Standing Balance Static Standing - Level of Assistance: 5: Stand by assistance Dynamic Standing Balance Dynamic Standing - Level of Assistance: 4: Min assist Extremity Assessment      RLE Assessment RLE Assessment: Within Functional Limits (decreased muscular endurance) LLE Assessment LLE Assessment: Exceptions to WFL LLE Strength LLE Overall Strength Comments: PROM WFL; hip 3-/5; knee and ankle 4/5  FIM:  FIM - Bed/Chair Transfer Bed/Chair Transfer Assistive Devices: Therapist, occupational: 4: Supine > Sit: Min A (steadying Pt. > 75%/lift 1 leg);4: Sit > Supine: Min A (steadying pt. > 75%/lift 1 leg);4: Bed > Chair or W/C: Min A (steadying Pt. > 75%);4: Chair or W/C > Bed: Min A (steadying Pt. > 75%) FIM - Locomotion: Wheelchair Locomotion: Wheelchair: 5: Travels 150 ft or more: maneuvers on rugs and over door sills with supervision, cueing or coaxing FIM - Locomotion: Ambulation Locomotion: Ambulation Assistive Devices: Designer, industrial/product Ambulation/Gait Assistance: 4: Min guard Locomotion: Ambulation: 1: Travels less than 50 ft with minimal assistance (Pt.>75%)   Refer to Care Plan for Long Term Goals  Recommendations for other services: None  Discharge Criteria: Patient will be discharged from PT if patient refuses treatment 3 consecutive times without medical reason, if treatment goals not met, if there is a change in medical status, if patient makes no progress towards goals or if patient is discharged from hospital.  The above assessment, treatment plan, treatment alternatives and goals were discussed and mutually agreed upon: by patient  Tedd Sias 10/23/2012, 10:46 AM

## 2012-10-23 NOTE — Evaluation (Signed)
Occupational Therapy Assessment and Plan & Session Note  Patient Details  Name: Amy Hamilton MRN: [id number] Date of Birth: August 14, 1950  OT Diagnosis: abnormal posture, acute pain, lumbago (low back pain) and muscle weakness (generalized) Rehab Potential: Rehab Potential: Excellent ELOS: 5-7 days   Today's Date: 10/23/2012  ASSESSMENT AND PLAN  Problem List:  Patient Active Problem List  Diagnosis  . Hip fracture, left  . Fall at home  . Hypothyroidism  . Depression  . Back pain  . Intertrochanteric fracture of left hip    Past Medical History:  Past Medical History  Diagnosis Date  . Thyroid disease   . Depression    Past Surgical History:  Past Surgical History  Procedure Laterality Date  . Femur im nail Left 10/18/2012    Procedure: INTRAMEDULLARY (IM) NAIL FEMORAL;  Surgeon: Cammy Copa, MD;  Location: [facility] OR;  Service: Orthopedics;  Laterality: Left;   Clinical Impression: Amy Hamilton is a 62 y.o. right-handed female admitted 10/17/2012 after a fall while in the kitchen landing on her left hip without loss of consciousness. X-rays and imaging revealed a displaced left intertrochanteric hip fracture. Underwent intramedullary nail 10/18/2012 per Dr. August Saucer. Advised partial weightbearing left lower extremity. Postoperative anemia 7.9 and transfused today. Pain management as directed. Physical therapy evaluation completed 10/19/2012 with recommendations for physical medicine rehabilitation consult to consider inpatient rehabilitation services. Patient was felt to be a good candidate for inpatient rehabilitation services and was admitted for comprehensive rehabilitation program. Patient transferred to CIR on 10/22/2012 .    Patient currently requires overall supervision except mod assist for LB ADLs with basic self-care skills and IADL secondary to muscle weakness and muscle joint tightness and decreased standing balance, decreased postural control, decreased balance  strategies and difficulty maintaining precautions.  Prior to hospitalization, patient could complete ADLs and IADLs independently.   Patient will benefit from skilled intervention to increase independence with basic self-care skills prior to discharge home with family.  Anticipate patient will require intermittent supervision and no further OT follow recommended.  OT - End of Session Activity Tolerance: Tolerates 10 - 20 min activity with multiple rests Endurance Deficit: Yes OT Assessment Rehab Potential: Excellent Barriers to Discharge: None (none known at this time) OT Plan OT Intensity: Minimum of 1-2 x/day, 45 to 90 minutes OT Frequency: 5 out of 7 days OT Duration/Estimated Length of Stay: 5-7 days OT Treatment/Interventions: Balance/vestibular training;Community reintegration;Discharge planning;DME/adaptive equipment instruction;Functional mobility training;Neuromuscular re-education;Pain management;Patient/family education;Psychosocial support;Self Care/advanced ADL retraining;Skin care/wound managment;Splinting/orthotics;Therapeutic Activities;UE/LE Strength taining/ROM;Therapeutic Exercise;UE/LE Coordination activities;Wheelchair propulsion/positioning OT Recommendation Recommendations for Other Services:  (none at this time) Patient destination: Home Follow Up Recommendations: None Equipment Recommended: 3 in 1 bedside comode;Tub/shower bench   Precautions/Restrictions  Precautions Precautions: Fall Restrictions Weight Bearing Restrictions: Yes LLE Weight Bearing: Partial weight bearing LLE Partial Weight Bearing Percentage or Pounds: patient able to put 10% through RLE  General Chart Reviewed: Yes Family/Caregiver Present: No  Pain Pain Assessment Pain Score: 10-Worst pain ever Pain Type: Acute pain;Surgical pain Pain Location: Leg Pain Orientation: Left Pain Descriptors: Aching Pain Intervention(s): RN made aware Multiple Pain Sites: Yes 2nd Pain Site Pain  Score: 10 Wong-Baker Pain Rating: Hurts worst Pain Type: Chronic pain Pain Location: Back Pain Orientation: Lower Pain Descriptors: Stabbing Pain Frequency: Constant Pain Onset: On-going Pain Intervention(s): RN made aware  Home Living/Prior Functioning Home Living Lives With: Son (76 yr old with special needs (mentally like a 61 yo per pt)) Available Help at Discharge: Available PRN/intermittently (  grandson will be coming to stay with pt temporarily) Type of Home: House Home Access: Stairs to enter [employer] of Steps: 2 at front door, 5 at back door Entrance Stairs-Rails: None Home Layout: Laundry or work area in basement;One level Alternate Level Stairs-Number of Steps: 12-15 stairs to get into basement Bathroom Shower/Tub: Tub/shower unit;Curtain How Accessible: Accessible via walker;Accessible via wheelchair (bathroom not w/c accessible) Home Adaptive Equipment: None IADL History Homemaking Responsibilities: Yes Meal Prep Responsibility: Primary Laundry Responsibility: Primary Cleaning Responsibility: Primary Bill Paying/Finance Responsibility: Primary Shopping Responsibility: Primary Child Care Responsibility: Primary (7 y/o son) Current License: Yes Occupation: Unemployed;On disability Type of Occupation: raised dogs Leisure and Hobbies: play with grandkids and greatgrandkids, go to park Prior Function Level of Independence: Independent with basic ADLs;Independent with homemaking with ambulation;Independent with transfers Able to Take Stairs?: Yes Driving: Yes Vocation: On disability  ADL - See FIM  Vision/Perception  Vision - History Baseline Vision: Wears glasses all the time Patient Visual Report: No change from baseline Vision - Assessment Eye Alignment: Within Functional Limits Perception Perception: Within Functional Limits Praxis Praxis: Intact   Cognition Overall Cognitive Status: Appears within functional limits for tasks  assessed Arousal/Alertness: Awake/alert Orientation Level: Oriented X4 Memory: Appears intact Awareness: Appears intact Problem Solving: Appears intact Safety/Judgment: Appears intact Comments: appears somewhat anxious personality  Sensation Sensation Light Touch: Appears Intact (BUEs) Light Touch Impaired Details:  (BUEs) Stereognosis: Appears Intact (BUEs) Hot/Cold: Appears Intact (BUEs) Proprioception: Appears Intact (BUEs) Additional Comments: patient with complaints of some numbness & tingling in bilateral hands Coordination Gross Motor Movements are Fluid and Coordinated: Yes Fine Motor Movements are Fluid and Coordinated: Yes  Motor  Motor Motor: Within Functional Limits  Trunk/Postural Assessment  Cervical Assessment Cervical Assessment: Within Functional Limits Thoracic Assessment Thoracic Assessment: Within Functional Limits Lumbar Assessment Lumbar Assessment: Within Functional Limits   Balance Balance Balance Assessed: Yes Static Sitting Balance Static Sitting - Level of Assistance: 7: Independent Dynamic Sitting Balance Dynamic Sitting - Level of Assistance: 6: Modified independent (Device/Increase time) Static Standing Balance Static Standing - Level of Assistance: 5: Stand by assistance Dynamic Standing Balance Dynamic Standing - Level of Assistance: 4: Min assist  Extremity/Trunk Assessment RUE Assessment RUE Assessment: Within Functional Limits LUE Assessment LUE Assessment: Within Functional Limits  FIM:  FIM - Eating Eating Activity: 7: Complete independence:no helper FIM - Grooming Grooming: 0: Activity did not occur FIM - Bathing Bathing: 0: Activity did not occur FIM - Upper Body Dressing/Undressing Upper body dressing/undressing steps patient completed: Thread/unthread right sleeve of pullover shirt/dresss;Thread/unthread left sleeve of pullover shirt/dress;Put head through opening of pull over shirt/dress;Pull shirt over trunk Upper  body dressing/undressing: 5: Supervision: Safety issues/verbal cues FIM - Lower Body Dressing/Undressing Lower body dressing/undressing steps patient completed: Thread/unthread right underwear leg;Pull underwear up/down;Thread/unthread right pants leg;Pull pants up/down;Don/Doff right sock Lower body dressing/undressing: 3: Mod-Patient completed 50-74% of tasks FIM - Toileting Toileting steps completed by patient: Adjust clothing prior to toileting;Performs perineal hygiene;Adjust clothing after toileting Toileting: 5: Supervision: Safety issues/verbal cues FIM - Bed/Chair Transfer Bed/Chair Transfer Assistive Devices: Manufacturing systems engineer Transfer: 4: Supine > Sit: Min A (steadying Pt. > 75%/lift 1 leg);4: Sit > Supine: Min A (steadying pt. > 75%/lift 1 leg);4: Bed > Chair or W/C: Min A (steadying Pt. > 75%);4: Chair or W/C > Bed: Min A (steadying Pt. > 75%) FIM - Diplomatic Services operational officer Devices: Elevated toilet seat;Walker;Grab bars Toilet Transfers: 5-To toilet/BSC: Supervision (verbal cues/safety issues);5-From toilet/BSC: Supervision (verbal cues/safety issues) FIM -  Tub/Shower Transfers Tub/shower Transfers: 0-Activity did not occur or was simulated   Refer to Care Plan for Long Term Goals  Recommendations for other services: None at this time.  Discharge Criteria: Patient will be discharged from OT if patient refuses treatment 3 consecutive times without medical reason, if treatment goals not met, if there is a change in medical status, if patient makes no progress towards goals or if patient is discharged from hospital.  The above assessment, treatment plan, treatment alternatives and goals were discussed and mutually agreed upon: by patient  ------------------------------------------------------------------------------------------------------------  SESSION NOTE 1030-1130 - 60 Minutes Individual Therapy Patient with 10/10 complaints of pain in LLE and back; RN  aware Initial 1:1 occupational therapy evaluation completed. Focused skilled intervention on sit<>stands, UB dressing, donning/doffing of socks, w/c mobility & propulsion, functional ambulation using rolling walker, toilet transfer on/off elevated toilet seat, toileting, and overall activity tolerance/endurance. At end of session left patient seated in w/c for next therapy session.   Amy Hamilton 10/23/2012, 11:49 AM

## 2012-10-23 NOTE — Progress Notes (Signed)
Physical Therapy Session Note  Patient Details  Name: Amy Hamilton MRN: [id number] Date of Birth: February 03, 1951  Today's Date: 10/23/2012 Time: 1130-1200 Time Calculation (min): 30 min  Short Term Goals: Week 1:  PT Short Term Goal 1 (Week 1): = LTGs  Skilled Therapeutic Interventions/Progress Updates:  Session focused on car transfers, stairs, and ambulation. Patient performed stand pivot transfer to car with supervision/cueing. Patient used UE's to assist left LE into simulated car. Patient ambulated up and down 3 steps with bilateral rails and supervision/cueing. Patient ambulated 67 feet with rolling walker and close supervision on level tile. Patient propelled wheelchair 150 feet using UE's. Patient left in wheelchair with all items in reach.    Therapy Documentation Precautions:  Precautions Precautions: Fall Restrictions Weight Bearing Restrictions: Yes LLE Weight Bearing: Partial weight bearing LLE Partial Weight Bearing Percentage or Pounds: patient able to put 10% through RLE Pain: Pain Assessment Pain Assessment: 0-10 Pain Score:   8 Pain Type: Surgical pain Pain Location: Leg Pain Orientation: Left Pain Descriptors: Aching Pain Intervention(s): pt reports just receiving pain meds Locomotion : Ambulation Ambulation: Yes Ambulation/Gait Assistance: 5: Supervision Assistive device: Rolling walker Gait Gait Pattern: Step-to pattern;Antalgic Wheelchair Mobility Wheelchair Mobility: Yes Wheelchair Assistance: 5: Supervision   See FIM for current functional status  Therapy/Group: Individual Therapy  Arelia Longest M 10/23/2012, 12:05 PM

## 2012-10-23 NOTE — Progress Notes (Signed)
Physical Therapy Note  Patient Details  Name: Amy Hamilton MRN: [id number] Date of Birth: October 04, 1950 Today's Date: 10/23/2012  Time: 1400-1430 30 minutes  Pt c/o 10/10 pain in L hip.  RN made aware.  Ice applied to hip, educated pt on importance of keeping up with pain medicine, importance of mobility for pain management.  Pt verbalizes understanding.  Handout provided for HEP for LE strengthening.  Reviewed exercises with pt and pt demos understanding of exercises using R LE due to L hip pain.  Pt reports she will try exercises if pain is relieved.  Pt missed 15 minutes skilled PT services due to refusal/pain.  Individual therapy   Hazelyn Kallen 10/23/2012, 2:29 PM

## 2012-10-23 NOTE — Patient Care Conference (Signed)
Inpatient RehabilitationTeam Conference and Plan of Care Update Date: 10/23/2012   Time: 2:10 PM    Patient Name: Amy Hamilton      Medical Record Number: [id number]  Date of Birth: 1951/04/16 Sex: Female         Room/Bed: 4006/4006-01 Payor Info: Payor: MEDICAID [place]  Plan: MEDICAID [place] ACCESS  Product Type: *No Product type*     Admitting Diagnosis: HIP FX  Admit Date/Time:  10/22/2012  5:30 PM Admission Comments: No comment available   Primary Diagnosis:  Intertrochanteric fracture of left hip Principal Problem: Intertrochanteric fracture of left hip  Patient Active Problem List   Diagnosis Date Noted  . Intertrochanteric fracture of left hip 10/23/2012  . Hip fracture, left 10/17/2012  . Fall at home 10/17/2012  . Hypothyroidism 10/17/2012  . Depression 10/17/2012  . Back pain 10/17/2012    Expected Discharge Date: Expected Discharge Date: 10/27/12  Team Members Present: Physician leading conference: Dr. Faith Rogue Social Worker Present: Amada Jupiter, LCSW Nurse Present: Other (comment) Derrill Kay, RN) PT Present: Karolee Stamps, PT OT Present: Edwin Cap, Loistine Chance, OT Other (Discipline and Name): Bayard Hugger, RN and Tora Duck, PPS Coordinator     Current Status/Progress Goal Weekly Team Focus  Medical   left IT hip fx, abla, hyponatremia  pain mgt, stabilize medical conditions for discharge  anemia. volume and electrolyte mgt. rule out DVT   Bowel/Bladder   Continent of bowel and bladder, LBM -10-22-12  Continent of bowel and bladder  Monitor   Swallow/Nutrition/ Hydration             ADL's   Overall supervision except mod assist for LB ADLs  Overall Mod I  ADL retraining, use of AE prn, sit<>stands, RW safety, dynamic standing, overall activity tolerance/endurance   Mobility   steady A overall  mod I overall; S car and min A stairs  gait training, dynamic standing balance, LLE strengthening/ROM, endurnace, stair training    Communication             Safety/Cognition/ Behavioral Observations            Pain   Scheduled Robaxin 500mg  QID; PRN oxycodone 5-10mg  q4h    pain < 3  Monitor pain and take pain med before pain becomes 8-10   Skin   Incision L hip with staples, xeroform, and Mepilex  Incision heals with infection  Monitor and change dressing as ordered    Rehab Goals Patient on target to meet rehab goals: Yes *See Care Plan and progress notes for long and short-term goals.  Barriers to Discharge: pain control, ortho precautions    Possible Resolutions to Barriers:  strength training, adaptive equipment    Discharge Planning/Teaching Needs:  Home with family able to provide supervision and some assist if needed.  New eval -    Team Discussion:  New eval with mod  i goals overall except min assist for stairs.  No concerns at this time.  Revisions to Treatment Plan:  None   Continued Need for Acute Rehabilitation Level of Care: The patient requires daily medical management by a physician with specialized training in physical medicine and rehabilitation for the following conditions: Daily direction of a multidisciplinary physical rehabilitation program to ensure safe treatment while eliciting the highest outcome that is of practical value to the patient.: Yes Daily medical management of patient stability for increased activity during participation in an intensive rehabilitation regime.: Yes Daily analysis of laboratory values and/or radiology reports with  any subsequent need for medication adjustment of medical intervention for : Post surgical problems (anemia, hyponatremia)  Ciani Rutten 10/23/2012, 4:44 PM

## 2012-10-23 NOTE — Progress Notes (Signed)
Subjective/Complaints: Left hip sore last night. Got blood txfsn yesterday afternoon. Didn't sleep well A 12 point review of systems has been performed and if not noted above is otherwise negative.   Objective: Vital Signs: Blood pressure 139/73, pulse 72, temperature 98 F (36.7 C), temperature source Oral, resp. rate 19, height 5\' 4"  (1.626 m), weight 58.151 kg (128 lb 3.2 oz), SpO2 98.00%. Dg Femur Left  10/21/2012  *RADIOLOGY REPORT*  Clinical Data: Increased left femoral pain following insertion of a femoral nail for a fracture.  LEFT FEMUR - 2 VIEW  Comparison: 10/17/2012 and 10/18/2012.  Findings: Intramedullary nail and compression screw fixation of the previously demonstrated left intertrochanteric fracture.  Mild valgus angulation with some distraction of the fragments inferiorly.  No acute fracture or dislocation.  Diffuse osteopenia.  IMPRESSION: Hardware fixation of the previously demonstrated left intertrochanteric fracture, as described above.   Original Report Authenticated By: Beckie Salts, M.D.     Recent Labs  10/22/12 1910 10/23/12 0625  WBC 6.8 5.1  HGB 8.8* 8.9*  HCT 24.7* 24.4*  PLT 330 344    Recent Labs  10/22/12 0525 10/22/12 1910 10/23/12 0625  NA 131*  --  132*  K 3.4*  --  3.6  CL 93*  --  93*  GLUCOSE 122*  --  104*  BUN 8  --  7  CREATININE 0.42* 0.39* 0.45*  CALCIUM 8.3*  --  8.8   CBG (last 3)  No results found for this basename: GLUCAP,  in the last 72 hours  Wt Readings from Last 3 Encounters:  10/22/12 58.151 kg (128 lb 3.2 oz)    Physical Exam:  Constitutional: She is oriented to person, place, and time. She appears well-developed.  HENT: mucosa pink and moist  Head: Normocephalic.  Eyes: EOM are normal.  Neck: Neck supple. No thyromegaly present. No jvd, lad  Cardiovascular: Normal rate and regular rhythm. No murmurs  Pulmonary/Chest: Breath sounds normal. No respiratory distress. She has no wheezes.  Abdominal: Soft. Bowel  sounds are normal. She exhibits no distension.  Musculoskeletal:  Left hip tender. There is 2+ swelling at left thigh Neurological: She is alert and oriented to person, place, and time. She has normal reflexes. She displays normal reflexes. No cranial nerve deficit. She exhibits normal muscle tone. Coordination normal.  Follows full commands. Left hip tender, affecting MMT. No focal motor sensory deficits noted otherwise. strenght in uppers is 5/5. Left lower is 1+ prox to 4/5 distally. RLE is 4 prox to 5/5 distally.  Skin:  Hip incision is dressed with minimal appropriate drainage.  Psychiatric: She has a normal mood and affect. Her behavior is normal. Judgment and thought content normal   Assessment/Plan: 1. Functional deficits secondary to left IT hip fx after a fall which require 3+ hours per day of interdisciplinary therapy in a comprehensive inpatient rehab setting. Physiatrist is providing close team supervision and 24 hour management of active medical problems listed below. Physiatrist and rehab team continue to assess barriers to discharge/monitor patient progress toward functional and medical goals. FIM:       FIM - Toileting Toileting: 5: Set-up assist to: Obtain supplies           Comprehension Comprehension Mode: Auditory Comprehension: 7-Follows complex conversation/direction: With no assist  Expression Expression Mode: Verbal Expression: 7-Expresses complex ideas: With no assist  Social Interaction Social Interaction: 6-Interacts appropriately with others with medication or extra time (anti-anxiety, antidepressant).  Problem Solving Problem Solving: 7-Solves complex problems: Recognizes &  self-corrects  Memory Memory: 7-Complete Independence: No helper  Medical Problem List and Plan:  1. Left intertrochanteric hip fracture. Status post intramedullary nail 10/18/2012  2. DVT Prophylaxis/Anticoagulation: check dopplers today. Increase lovenox to 30 q12 3.  Pain Management: Oxycodone as needed. Will schedule robaxin. Monitor with increased activity  4. Mood: Vyvanse 70 mg daily, Prozac 40 mg daily. Provide emotional support and positive reinforcement  5. Neuropsych: This patient is capable of making decisions on his/her own behalf.  6. Postoperative anemia. And iron supplement followup CBC after transfusion 7. Hypothyroidism. Synthroid  8. Tobacco abuse. NicoDerm patch. Provide counseling  9. Hypokalemia and hyponatremia. May be stress related.   -continue with current fluids/diet for now  -recheck sodium thursday LOS (Days) 1 A FACE TO FACE EVALUATION WAS PERFORMED  SWARTZ,ZACHARY T 10/23/2012 7:41 AM

## 2012-10-23 NOTE — Progress Notes (Signed)
*  PRELIMINARY RESULTS* Vascular Ultrasound Lower extremity venous duplex has been completed.  Preliminary findings: Bilateral:  No evidence of DVT, superficial thrombosis, or Baker's Cyst.     Farrel Demark, RDMS, RVT  10/23/2012, 4:05 PM

## 2012-10-23 NOTE — Progress Notes (Signed)
Patient information reviewed and entered into [facility] system by Fajr Fife, RN, CRRN, PPS Coordinator.  Information including medical coding and functional independence measure will be reviewed and updated through discharge.    

## 2012-10-24 ENCOUNTER — Inpatient Hospital Stay (HOSPITAL_COMMUNITY): Payer: Medicaid Other

## 2012-10-24 ENCOUNTER — Inpatient Hospital Stay (HOSPITAL_COMMUNITY): Payer: Medicaid Other | Admitting: Occupational Therapy

## 2012-10-24 DIAGNOSIS — D62 Acute posthemorrhagic anemia: Secondary | ICD-10-CM

## 2012-10-24 DIAGNOSIS — E871 Hypo-osmolality and hyponatremia: Secondary | ICD-10-CM

## 2012-10-24 DIAGNOSIS — W19XXXA Unspecified fall, initial encounter: Secondary | ICD-10-CM

## 2012-10-24 DIAGNOSIS — S72143A Displaced intertrochanteric fracture of unspecified femur, initial encounter for closed fracture: Secondary | ICD-10-CM

## 2012-10-24 NOTE — Progress Notes (Signed)
Occupational Therapy Note  Patient Details  Name: Amy Hamilton MRN: [id number] Date of Birth: 1951-04-16 Today's Date: 10/24/2012  Time: 7:30-8:15am ( .) Pt seen for 1:1 session focusing on ADL's, transfers and functional mobility. Pt in bed ready for get washed up and dressed. No c/o pain. Pt transfers with S and required 1 verbal cue during session to lock wheelchair brakes. Pt bathed at sink level and dressed from wheelchair using LH shoehorn, reacher and sock aide. Pt demonstrated use of all AE, with sock aide being most helpful to her. Pt stood at sink to complete oral hygiene. Explained to pt the importance of slowing down while doing her "normal" activities at home until she's healed to increase safety, with pt verbalizing understanding.     Braya Habermehl Hessie Diener 10/24/2012, 9:48 AM

## 2012-10-24 NOTE — Progress Notes (Signed)
Subjective/Complaints: Had a good day. Slept better last night. A 12 point review of systems has been performed and if not noted above is otherwise negative.   Objective: Vital Signs: Blood pressure 129/77, pulse 77, temperature 97.6 F (36.4 C), temperature source Oral, resp. rate 18, height 5\' 4"  (1.626 m), weight 58.151 kg (128 lb 3.2 oz), SpO2 95.00%. No results found.  Recent Labs  10/22/12 1910 10/23/12 0625  WBC 6.8 5.1  HGB 8.8* 8.9*  HCT 24.7* 24.4*  PLT 330 344    Recent Labs  10/22/12 0525 10/22/12 1910 10/23/12 0625  NA 131*  --  132*  K 3.4*  --  3.6  CL 93*  --  93*  GLUCOSE 122*  --  104*  BUN 8  --  7  CREATININE 0.42* 0.39* 0.45*  CALCIUM 8.3*  --  8.8   CBG (last 3)  No results found for this basename: GLUCAP,  in the last 72 hours  Wt Readings from Last 3 Encounters:  10/22/12 58.151 kg (128 lb 3.2 oz)    Physical Exam:  Constitutional: She is oriented to person, place, and time. She appears well-developed.  HENT: mucosa pink and moist  Head: Normocephalic.  Eyes: EOM are normal.  Neck: Neck supple. No thyromegaly present. No jvd, lad  Cardiovascular: Normal rate and regular rhythm. No murmurs  Pulmonary/Chest: Breath sounds normal. No respiratory distress. She has no wheezes.  Abdominal: Soft. Bowel sounds are normal. She exhibits no distension.  Musculoskeletal:  Left hip tender. There is 2+ swelling at left thigh Neurological: She is alert and oriented to person, place, and time. She has normal reflexes. She displays normal reflexes. No cranial nerve deficit. She exhibits normal muscle tone. Coordination normal.  Follows full commands. Left hip tender, affecting MMT. No focal motor sensory deficits noted otherwise. strenght in uppers is 5/5. Left lower is 1+ prox to 4/5 distally. RLE is 4 prox to 5/5 distally.  Skin:  Hip incision is clean, with minimal serosanguinous drainage Psychiatric: She has a normal mood and affect. Her behavior is  normal. Judgment and thought content normal   Assessment/Plan: 1. Functional deficits secondary to left IT hip fx after a fall which require 3+ hours per day of interdisciplinary therapy in a comprehensive inpatient rehab setting. Physiatrist is providing close team supervision and 24 hour management of active medical problems listed below. Physiatrist and rehab team continue to assess barriers to discharge/monitor patient progress toward functional and medical goals. FIM: FIM - Bathing Bathing: 0: Activity did not occur  FIM - Upper Body Dressing/Undressing Upper body dressing/undressing steps patient completed: Thread/unthread right sleeve of pullover shirt/dresss;Thread/unthread left sleeve of pullover shirt/dress;Put head through opening of pull over shirt/dress;Pull shirt over trunk Upper body dressing/undressing: 5: Supervision: Safety issues/verbal cues FIM - Lower Body Dressing/Undressing Lower body dressing/undressing steps patient completed: Thread/unthread right underwear leg;Pull underwear up/down;Thread/unthread right pants leg;Pull pants up/down;Don/Doff right sock Lower body dressing/undressing: 3: Mod-Patient completed 50-74% of tasks  FIM - Toileting Toileting steps completed by patient: Adjust clothing prior to toileting;Performs perineal hygiene;Adjust clothing after toileting Toileting: 5: Supervision: Safety issues/verbal cues  FIM - Archivist Transfers Assistive Devices: Elevated toilet seat;Walker;Grab bars Toilet Transfers: 5-To toilet/BSC: Supervision (verbal cues/safety issues);5-From toilet/BSC: Supervision (verbal cues/safety issues)  FIM - Banker Devices: Therapist, occupational: 4: Supine > Sit: Min A (steadying Pt. > 75%/lift 1 leg);4: Sit > Supine: Min A (steadying pt. > 75%/lift 1 leg);4: Bed > Chair or  W/C: Min A (steadying Pt. > 75%);4: Chair or W/C > Bed: Min A (steadying Pt. > 75%)  FIM -  Locomotion: Wheelchair Locomotion: Wheelchair: 5: Travels 150 ft or more: maneuvers on rugs and over door sills with supervision, cueing or coaxing FIM - Locomotion: Ambulation Locomotion: Ambulation Assistive Devices: Designer, industrial/product Ambulation/Gait Assistance: 5: Supervision Locomotion: Ambulation: 2: Travels 50 - 149 ft with supervision/safety issues  Comprehension Comprehension Mode: Auditory Comprehension: 7-Follows complex conversation/direction: With no assist  Expression Expression Mode: Verbal Expression: 7-Expresses complex ideas: With no assist  Social Interaction Social Interaction: 6-Interacts appropriately with others with medication or extra time (anti-anxiety, antidepressant).  Problem Solving Problem Solving: 7-Solves complex problems: Recognizes & self-corrects  Memory Memory: 7-Complete Independence: No helper  Medical Problem List and Plan:  1. Left intertrochanteric hip fracture. Status post intramedullary nail 10/18/2012  2. DVT Prophylaxis/Anticoagulation: dopplers negative. Increase lovenox to 30 q12 3. Pain Management: Oxycodone as needed. scheduled robaxin. Monitor with increased activity  4. Mood: Vyvanse 70 mg daily, Prozac 40 mg daily. Provide emotional support and positive reinforcement  5. Neuropsych: This patient is capable of making decisions on his/her own behalf.  6. Postoperative anemia. added iron supplement followup CBC after transfusion 7. Hypothyroidism. Synthroid  8. Tobacco abuse. NicoDerm patch. Provide counseling  9. Hypokalemia and hyponatremia.  -continue with current fluids/diet for now  -recheck sodium tomorrow LOS (Days) 2 A FACE TO FACE EVALUATION WAS PERFORMED  Amy Hamilton T 10/24/2012 7:57 AM

## 2012-10-24 NOTE — Progress Notes (Signed)
Physical Therapy Session Note  Patient Details  Name: Amy Hamilton MRN: [id number] Date of Birth: 1951/07/04  Today's Date: 10/24/2012 Time: 7829-5621 Time Calculation (min): 23 min  Short Term Goals: Week 1:  PT Short Term Goal 1 (Week 1): = LTGs  Skilled Therapeutic Interventions/Progress Updates:    Pt presents in supine in the bed with complaints of increased L hip pain which she already had pain medication. Completed gait and toileting with RW in the room with overall S. Pt declined going down to therapy gym and doing any other activity due to increased pain. Educated and discussed d/c planning and follow up therapy as well as equipment recommendations. S transfer into recliner and ice pack applied to L hip. RN notified of pt's limited participation due to pain.   Therapy Documentation Precautions:  Precautions Precautions: Fall Restrictions Weight Bearing Restrictions: Yes LLE Weight Bearing: Partial weight bearing LLE Partial Weight Bearing Percentage or Pounds: 10%   Pain: C/o 9/10 pain - RN aware and pt premedicated.   Locomotion : Ambulation Ambulation/Gait Assistance: 5: Supervision   See FIM for current functional status  Therapy/Group: Individual Therapy  Karolee Stamps [facility] 10/24/2012, 2:55 PM

## 2012-10-24 NOTE — Progress Notes (Signed)
Physical Therapy Session Note  Patient Details  Name: Amy Hamilton MRN: [id number] Date of Birth: April 18, 1951  Today's Date: 10/24/2012 Time: 1000-1042 Time Calculation (min): 42 min (missed 18 min due to pain)  Short Term Goals: Week 1:  PT Short Term Goal 1 (Week 1): = LTGs  Skilled Therapeutic Interventions/Progress Updates:    Supine therex for functional strengthening with AAROM on LLE and 3# ankle weight on RLE; heel slides, SAQ with 5 second hold, hip abduction, and LAQ with 5 second hold x 10 reps x 2 sets each - rest breaks as needed. Bed mobility retraining with leg lifter for LLE with S and extra time; pain limiting. Pt with complaint of pain being too much right now so returned back to room; practiced basic transfers with S using RW (maintaining WB restrictions). RN notified and medication received for pain - pt in bed in supine.  Therapy Documentation Precautions:  Precautions Precautions: Fall Restrictions Weight Bearing Restrictions: Yes LLE Weight Bearing: Partial weight bearing LLE Partial Weight Bearing Percentage or Pounds: 10%   Pain: Reports 9/10 pain in L hip - ended session early due to this and RN notified and medication received. Returned to bed at end of session.  See FIM for current functional status  Therapy/Group: Individual Therapy  Karolee Stamps [facility] 10/24/2012, 10:48 AM

## 2012-10-24 NOTE — Progress Notes (Signed)
Occupational Therapy Session Note  Patient Details  Name: Amy Hamilton MRN: [id number] Date of Birth: July 15, 1951  Today's Date: 10/24/2012 Time: 0900-0955 Time Calculation (min): 55 min  Short Term Goals: Week 1:  OT Short Term Goal 1 (Week 1): Short Term Goals = Long Term Goals secondary to ELOS  Skilled Therapeutic Interventions/Progress Updates:  Patient found supine in bed in chair position. Patient engaged in bed mobility, then ambulated from room -> ADL apartment using rolling walker with one seated rest break. In ADL apartment engaged in furniture transfer on/off couch, tub/shower transfer on/off tub transfer bench, bed mobility, and functional ambulation using rolling walker (introduced leg lifter -> patient to increase independence with bed mobility and in/out tub/shower unit); recommend tub transfer bench for home use. Patient then ambulated to bathroom close to 4500 for toilet transfer on/off standard size toilet seat. Patient able to complete standard toilet transfer with no problem; mod I level. Recommend patient have a BSC for beside the bed at night time, patient stated she goes to bathroom a lot at night and is too tired to walk to bathroom each time. Patient then ambulated back to ADL apartment for seated rest break. After 5 minute rest break patient ambulated -> therapy gym and engaged in simulated bed transfer (simulated like at home). Left patient supine on mat for next therapy session.   Precautions:  Precautions Precautions: Fall Restrictions Weight Bearing Restrictions: Yes LLE Weight Bearing: Partial weight bearing LLE Partial Weight Bearing Percentage or Pounds: 10%  See FIM for current functional status  Therapy/Group: Individual Therapy  Micky Sheller 10/24/2012, 9:58 AM

## 2012-10-24 NOTE — Progress Notes (Signed)
[facility] Individual Statement of Services  Patient Name:  Amy Hamilton  Date:  10/24/2012  Welcome to the [facility].  Our goal is to provide you with an individualized program based on your diagnosis and situation, designed to meet your specific needs.  With this comprehensive rehabilitation program, you will be expected to participate in at least 3 hours of rehabilitation therapies Monday-Friday, with modified therapy programming on the weekends.  Your rehabilitation program will include the following services:  Physical Therapy (PT), Occupational Therapy (OT), 24 hour per day rehabilitation nursing, Therapeutic Recreaction (TR), Case Management ( Social Worker), Rehabilitation Medicine, Nutrition Services and Pharmacy Services  Weekly team conferences will be held on Tuesdays to discuss your progress.  Your Social Worker will talk with you frequently to get your input and to update you on team discussions.  Team conferences with you and your family in attendance may also be held.  Expected length of stay: 5-7 days  Overall anticipated outcome: modified independent  Depending on your progress and recovery, your program may change. Your Social Worker will coordinate services and will keep you informed of any changes. Your Social Worker's name and contact numbers are listed  below.  The following services may also be recommended but are not provided by the [facility]:   Driving Evaluations  Home Health Rehabiltiation Services  Outpatient Rehabilitatation [facility]  Vocational Rehabilitation   Arrangements will be made to provide these services after discharge if needed.  Arrangements include referral to agencies that provide these services.  Your insurance has been verified to be:  Medicaid Your primary doctor is:  Dr. Jeannetta Nap  Pertinent information will be shared with your doctor and your insurance company.  Social  Worker:  [place], [place] [phone] or [phone]  Information discussed with and copy given to patient by: Amada Jupiter, 10/24/2012, 4:30 PM

## 2012-10-25 ENCOUNTER — Inpatient Hospital Stay (HOSPITAL_COMMUNITY): Payer: Medicaid Other

## 2012-10-25 ENCOUNTER — Inpatient Hospital Stay (HOSPITAL_COMMUNITY): Payer: Medicaid Other | Admitting: *Deleted

## 2012-10-25 ENCOUNTER — Inpatient Hospital Stay (HOSPITAL_COMMUNITY): Payer: Medicaid Other | Admitting: Occupational Therapy

## 2012-10-25 LAB — BASIC METABOLIC PANEL
CO2: 30 mEq/L (ref 19–32)
Calcium: 8.9 mg/dL (ref 8.4–10.5)
Chloride: 95 mEq/L — ABNORMAL LOW (ref 96–112)
Creatinine, Ser: 0.47 mg/dL — ABNORMAL LOW (ref 0.50–1.10)
Glucose, Bld: 110 mg/dL — ABNORMAL HIGH (ref 70–99)

## 2012-10-25 MED ORDER — ALBUTEROL SULFATE HFA 108 (90 BASE) MCG/ACT IN AERS
1.0000 | INHALATION_SPRAY | Freq: Four times a day (QID) | RESPIRATORY_TRACT | Status: DC | PRN
  Administered 2012-10-25 – 2012-10-26 (×2): 2 via RESPIRATORY_TRACT
  Filled 2012-10-25 (×2): qty 6.7

## 2012-10-25 MED ORDER — OXYCODONE HCL 5 MG PO TABS
10.0000 mg | ORAL_TABLET | Freq: Two times a day (BID) | ORAL | Status: DC
  Administered 2012-10-25 – 2012-10-27 (×5): 10 mg via ORAL
  Filled 2012-10-25 (×7): qty 2

## 2012-10-25 NOTE — Progress Notes (Signed)
Physical Therapy Session Note  Patient Details  Name: LATIKA KRONICK MRN: [id number] Date of Birth: [birth year]  Today's Date: 10/25/2012 Time: 1400-1445 Time Calculation (min): 45 min  Short Term Goals: Week 1:  PT Short Term Goal 1 (Week 1): = LTGs  Skilled Therapeutic Interventions/Progress Updates:   Gait with RW for endurance and strengthening to/from therapy gym. Supine strengthening exercises including modified bridging with 10 second hold (2 sets of 10 reps), reverse crunches with therapy ball 3 sets of 10 reps, and SAQ with 5 second hold bilateral LE's at the same time with 3# ankle weight on RLE. Mod I for bed mobility and transfers; made pt mod I in room with RW. Heat pack applied to L hip at end of session.   Therapy Documentation Precautions:  Precautions Precautions: Fall Restrictions Weight Bearing Restrictions: Yes LLE Weight Bearing: Partial weight bearing LLE Partial Weight Bearing Percentage or Pounds: 10%    Pain: Pain in hip present but heat helped earlier. Applied again at end of session.    See FIM for current functional status  Therapy/Group: Individual Therapy  Karolee Stamps [facility] 10/25/2012, 3:00 PM

## 2012-10-25 NOTE — Progress Notes (Signed)
Physical Therapy Session Note  Patient Details  Name: Amy Hamilton MRN: [id number] Date of Birth: 06/05/1951  Today's Date: 10/25/2012 Time: 4132-4401 Time Calculation (min): 27 min  Short Term Goals: Week 1:  PT Short Term Goal 1 (Week 1): = LTGs  Skilled Therapeutic Interventions/Progress Updates:    Patient received supine in bed. Today's session focused on increasing functional activity tolerance with gait training and stair negotiation, see details below. Patient performed gait training on tile and carpet surfaces with rolling walker and mod I. Patient returned to room and left supine in bed with all needs within reach.  Therapy Documentation Precautions:  Precautions Precautions: Fall Restrictions Weight Bearing Restrictions: Yes LLE Weight Bearing: Partial weight bearing LLE Partial Weight Bearing Percentage or Pounds: 10% Pain: Pain Assessment Pain Assessment: No/denies pain Pain Score: 0-No pain Locomotion : Ambulation Ambulation: Yes Ambulation/Gait Assistance: 6: Modified independent (Device/Increase time) Ambulation Distance (Feet): 124' x2, 75' x1 Assistive device: Rolling walker Gait Gait: Yes Gait Pattern: Step-to pattern;Antalgic Stairs / Additional Locomotion Stairs: Yes Stairs Assistance: 4: Min assist Stair Management Technique: With walker;Step to pattern;Backwards Number of Stairs: 5 Height of Stairs: 7 Wheelchair Mobility Wheelchair Mobility: No   See FIM for current functional status  Therapy/Group: Individual Therapy  Chipper Herb. Breyah Akhter, PT, DPT  10/25/2012, 4:40 PM

## 2012-10-25 NOTE — Progress Notes (Signed)
Physical Therapy Session Note  Patient Details  Name: Amy Hamilton MRN: [id number] Date of Birth: [birth year]  Today's Date: 10/25/2012 Time: 1020-1100 Time Calculation (min): 40 min  Short Term Goals: Week 1:  PT Short Term Goal 1 (Week 1): = LTGs  Skilled Therapeutic Interventions/Progress Updates:    Initially pt refusing therapy due to 10/10 pain in L hip - premedicated and has been using ice (missed 20 min). Pt states she does not even feel like she can move her leg and it weighs "150 pounds." Pt declined doing upper body exercise or w/c mobility as well. Discussed with RN and PA who also talked to pt and encouraged mobility and recommended heat at the end of the session. Then pt agreeable to try a little therapy. Gait with RW on unit maintaining WB status without difficulty for functional mobility and endurance training. Practiced 2 steps with RW for home entry (no rails) with min A to stabilize RW. Discussed she would need someone (probably the grandson who will be staying to assist) to help her with this going into/out of the house - pt verbalized understanding and in agreement. Supine gentle AAROM to LLE for ROM/stretching and heat pack applied at end of session. Recommended to leave on for 20 min and encouraged to monitor for temperature.   Therapy Documentation Precautions:  Precautions Precautions: Fall Restrictions Weight Bearing Restrictions: Yes LLE Weight Bearing: Partial weight bearing LLE Partial Weight Bearing Percentage or Pounds: 10% General: Amount of Missed PT Time (min): 20 Minutes Missed Time Reason: Pain Pain: Pain Assessment Pain Assessment: 0-10 Pain Score: 10-Worst pain ever Pain Type: Acute pain Pain Location: Leg Pain Orientation: Left Pain Descriptors: Aching Pain Frequency: Intermittent Pain Onset: Gradual Patients Stated Pain Goal: 3 Pain Intervention(s): Medication (See eMAR);Repositioned Multiple Pain Sites: No    Locomotion  : Ambulation Ambulation/Gait Assistance: 5: Supervision   See FIM for current functional status  Therapy/Group: Individual Therapy  Karolee Stamps [facility] 10/25/2012, 12:19 PM

## 2012-10-25 NOTE — Progress Notes (Signed)
Social Work  Social Work Assessment and Plan  Patient Details  Name: Amy Hamilton MRN: [id number] Date of Birth: 06-08-1963  Today's Date: 10/25/2012  Problem List:  Patient Active Problem List  Diagnosis  . Hip fracture, left  . Fall at home  . Hypothyroidism  . Depression  . Back pain  . Intertrochanteric fracture of left hip   Past Medical History:  Past Medical History  Diagnosis Date  . Thyroid disease   . Depression    Past Surgical History:  Past Surgical History  Procedure Laterality Date  . Femur im nail Left 10/18/2012    Procedure: INTRAMEDULLARY (IM) NAIL FEMORAL;  Surgeon: Cammy Copa, MD;  Location: [facility] OR;  Service: Orthopedics;  Laterality: Left;   Social History:  reports that she does not drink alcohol. Her tobacco and drug histories are not on file.  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 Caregiver Availability: 24/7 Family Dynamics: Pt describes supportive family.  Denies any concerns about their ability to assist upon her d/c.  Social History Preferred language: English Religion: Baptist Cultural Background: NA Education: HS Read: Yes Write: Yes Employment Status: Disabled Date Retired/Disabled/Unemployed: approx 2 years (due to "back problems") Fish farm manager Issues: None Guardian/Conservator: None   Abuse/Neglect Physical Abuse: Denies Verbal Abuse: Denies Sexual Abuse:  Denies Exploitation of patient/patient's resources: Denies Self-Neglect: Denies  Emotional Status Pt's affect, behavior adn adjustment status: Pt very pleasant, talkative - denying any significant emotional distress beyond frustration with current circumstances.  Denies any concerns about managing at home with family assisting. Recent Psychosocial Issues: None Pyschiatric History: None Substance Abuse History: None  Patient / Family Perceptions, Expectations & Goals Pt/Family understanding of illness & functional limitations: Pt and family with basic understanding of her injury, functional limitations and WB precautions Premorbid pt/family roles/activities: Pt was providing care to her son, Gregary Signs and often to her grandchildren. Anticipated changes in roles/activities/participation: Family will need to assume some caregiver support role until she regains her independence. Pt/family expectations/goals: Pt aware she will need some assist intially  [employer]: None Premorbid Home Care/DME Agencies: None Transportation available at discharge: yes  Discharge Planning Living Arrangements: Children (21 year old special needs son) Support Systems: Children Type of Residence: Private residence Insurance Resources: Medicaid (specify county) [employer] Financial Resources: SSI Financial Screen Referred: No Living Expenses: Psychologist, sport and exercise Management: Patient Do you have any problems obtaining your medications?: No Home Management: pt and family Patient/Family Preliminary Plans: Pt plans to return home with her son and grandson to assist as needed. Barriers to Discharge: Steps (will need to review technique with grandson) Social Work Anticipated Follow Up Needs: HH/OP Expected length of stay: ELOS 7 to 10 days  Clinical Impression Pleasant, talkative woman here after a fall with hip fx - good family support.  No emotional distress noted but will monitor.  Short  LOS.  Rachid Parham 10/25/2012, 10:44 AM

## 2012-10-25 NOTE — Progress Notes (Signed)
Occupational Therapy Session Note  Patient Details  Name: Amy Hamilton MRN: [id number] Date of Birth: 11/10/1950  Today's Date: 10/25/2012 Time: 4098-1191 Time Calculation (min): 55 min  Short Term Goals: Week 1:  OT Short Term Goal 1 (Week 1): Short Term Goals = Long Term Goals secondary to ELOS  Skilled Therapeutic Interventions/Progress Updates:  Patient found supine in bed with complaints 10/10 pain in LLE, after shower patient with 6/10 complaints of pain. Patient engaged in functional ambulation/mobility using rolling walker -> bathroom for toilet transfer on/off elevated toilet seat, toileting, then shower transfer on/off shower seat. Patient completed ADL retraining at shower level in sit<>stand position, therapist administered long handled sponge to increase independent with LB bathing. After shower patient ambulated -> w/c for UB/LB dressing in sit<>stand position. Focused skilled intervention on overall activity tolerance/endurance, pain management, functional mobility, dynamic standing balance/tolerance/endurance, UB/LB bathing & dressing, using AE prn to increase overall independence, and grooming tasks seated in w/c. At end of session, left patient seated in w/c with call bell & phone within reach.   Precautions:  Precautions Precautions: Fall Restrictions Weight Bearing Restrictions: Yes LLE Weight Bearing: Partial weight bearing LLE Partial Weight Bearing Percentage or Pounds: 10%  See FIM for current functional status  Therapy/Group: Individual Therapy  Jesusmanuel Erbes 10/25/2012, 9:30 AM

## 2012-10-25 NOTE — Progress Notes (Signed)
Subjective/Complaints: Had more pain yesterday. Very sore today especially in left leg A 12 point review of systems has been performed and if not noted above is otherwise negative.   Objective: Vital Signs: Blood pressure 146/83, pulse 70, temperature 98.1 F (36.7 C), temperature source Oral, resp. rate 18, height 5\' 4"  (1.626 m), weight 55.838 kg (123 lb 1.6 oz), SpO2 98.00%. No results found.  Recent Labs  10/22/12 1910 10/23/12 0625  WBC 6.8 5.1  HGB 8.8* 8.9*  HCT 24.7* 24.4*  PLT 330 344    Recent Labs  10/22/12 1910 10/23/12 0625  NA  --  132*  K  --  3.6  CL  --  93*  GLUCOSE  --  104*  BUN  --  7  CREATININE 0.39* 0.45*  CALCIUM  --  8.8   CBG (last 3)  No results found for this basename: GLUCAP,  in the last 72 hours  Wt Readings from Last 3 Encounters:  10/24/12 55.838 kg (123 lb 1.6 oz)    Physical Exam:  Constitutional: She is oriented to person, place, and time. She appears well-developed.  HENT: mucosa pink and moist  Head: Normocephalic.  Eyes: EOM are normal.  Neck: Neck supple. No thyromegaly present. No jvd, lad  Cardiovascular: Normal rate and regular rhythm. No murmurs  Pulmonary/Chest: Breath sounds normal. No respiratory distress. She has no wheezes.  Abdominal: Soft. Bowel sounds are normal. She exhibits no distension.  Musculoskeletal:  Left hip tender. There is 2+ swelling at left thigh Neurological: She is alert and oriented to person, place, and time. She has normal reflexes. She displays normal reflexes. No cranial nerve deficit. She exhibits normal muscle tone. Coordination normal.  Follows full commands. Left hip tender, affecting MMT. No focal motor sensory deficits noted otherwise. strenght in uppers is 5/5. Left lower is 1+ prox to 4/5 distally. RLE is 4 prox to 5/5 distally.  Skin:  Hip incision is clean, with minimal serosanguinous drainage Psychiatric: She has a normal mood and affect. Her behavior is normal. Judgment and  thought content normal   Assessment/Plan: 1. Functional deficits secondary to left IT hip fx after a fall which require 3+ hours per day of interdisciplinary therapy in a comprehensive inpatient rehab setting. Physiatrist is providing close team supervision and 24 hour management of active medical problems listed below. Physiatrist and rehab team continue to assess barriers to discharge/monitor patient progress toward functional and medical goals. FIM: FIM - Bathing Bathing Steps Patient Completed: Chest;Right Arm;Left Arm;Abdomen;Front perineal area;Buttocks;Right upper leg;Left upper leg;Right lower leg (including foot) Bathing: 4: Min-Patient completes 8-9 74f 10 parts or 75+ percent  FIM - Upper Body Dressing/Undressing Upper body dressing/undressing steps patient completed: Thread/unthread right bra strap;Thread/unthread left bra strap;Hook/unhook bra;Thread/unthread right sleeve of pullover shirt/dresss;Thread/unthread left sleeve of pullover shirt/dress;Put head through opening of pull over shirt/dress;Pull shirt over trunk Upper body dressing/undressing: 5: Supervision: Safety issues/verbal cues FIM - Lower Body Dressing/Undressing Lower body dressing/undressing steps patient completed: Thread/unthread right underwear leg;Thread/unthread left underwear leg;Pull underwear up/down;Thread/unthread right pants leg;Thread/unthread left pants leg;Pull pants up/down;Don/Doff right sock;Don/Doff left sock;Don/Doff right shoe;Don/Doff left shoe (used AE for socks/shoes) Lower body dressing/undressing: 6: Assistive device (Comment) (sock aide, reacher, LH shoehorn)  FIM - Toileting Toileting steps completed by patient: Adjust clothing prior to toileting;Performs perineal hygiene;Adjust clothing after toileting Toileting: 5: Supervision: Safety issues/verbal cues  FIM - Archivist Transfers Assistive Devices: Art gallery manager Transfers: 5-To toilet/BSC: Supervision (verbal cues/safety  issues);5-From toilet/BSC: Supervision (verbal cues/safety  issues)  FIM - Bed/Chair Transfer Bed/Chair Transfer Assistive Devices: Walker (leg lifter) Bed/Chair Transfer: 5: Supine > Sit: Supervision (verbal cues/safety issues);5: Sit > Supine: Supervision (verbal cues/safety issues);5: Bed > Chair or W/C: Supervision (verbal cues/safety issues);5: Chair or W/C > Bed: Supervision (verbal cues/safety issues)  FIM - Locomotion: Wheelchair Locomotion: Wheelchair: 5: Travels 150 ft or more: maneuvers on rugs and over door sills with supervision, cueing or coaxing FIM - Locomotion: Ambulation Locomotion: Ambulation Assistive Devices: Designer, industrial/product Ambulation/Gait Assistance: 5: Supervision Locomotion: Ambulation: 1: Travels less than 50 ft with supervision/safety issues  Comprehension Comprehension Mode: Auditory Comprehension: 7-Follows complex conversation/direction: With no assist  Expression Expression Mode: Verbal Expression: 7-Expresses complex ideas: With no assist  Social Interaction Social Interaction: 6-Interacts appropriately with others with medication or extra time (anti-anxiety, antidepressant).  Problem Solving Problem Solving: 7-Solves complex problems: Recognizes & self-corrects  Memory Memory: 7-Complete Independence: No helper  Medical Problem List and Plan:  1. Left intertrochanteric hip fracture. Status post intramedullary nail 10/18/2012  2. DVT Prophylaxis/Anticoagulation: dopplers negative. Increased lovenox to 30 q12 3. Pain Management: Oxycodone as needed. scheduled robaxin. Monitor with increased activity  -will schedule oxycodone at 0700 and 1200 daily  4. Mood: Vyvanse 70 mg daily, Prozac 40 mg daily. Provide emotional support and positive reinforcement  5. Neuropsych: This patient is capable of making decisions on his/her own behalf.  6. Postoperative anemia. added iron supplement followup CBC after transfusion 7. Hypothyroidism. Synthroid  8.  Tobacco abuse. NicoDerm patch. Provide counseling  9. Hypokalemia and hyponatremia.  -continue with current fluids/diet for now  -recheck sodium today LOS (Days) 3 A FACE TO FACE EVALUATION WAS PERFORMED  Aleida Crandell T 10/25/2012 7:45 AM

## 2012-10-25 NOTE — Progress Notes (Signed)
Social Work Patient ID: Amy Hamilton, female   DOB: 1950-10-09, 62 y.o.   MRN: [id number]  Met yesterday with patient to review team conference - aware and agreeable with targeted d/c 4/5 at mod i goals.  Pt reports grandson due to arrive back in [place] today and he will be at the home to assist her in any way she needs.  Also agreeable with HHPT follow up.  No concerns at this point.  Dwight Burdo, LCSW

## 2012-10-26 ENCOUNTER — Inpatient Hospital Stay (HOSPITAL_COMMUNITY): Payer: Medicaid Other | Admitting: *Deleted

## 2012-10-26 ENCOUNTER — Inpatient Hospital Stay (HOSPITAL_COMMUNITY): Payer: Medicaid Other | Admitting: Physical Therapy

## 2012-10-26 ENCOUNTER — Inpatient Hospital Stay (HOSPITAL_COMMUNITY): Payer: Medicaid Other

## 2012-10-26 DIAGNOSIS — W19XXXA Unspecified fall, initial encounter: Secondary | ICD-10-CM

## 2012-10-26 DIAGNOSIS — E871 Hypo-osmolality and hyponatremia: Secondary | ICD-10-CM

## 2012-10-26 DIAGNOSIS — D62 Acute posthemorrhagic anemia: Secondary | ICD-10-CM

## 2012-10-26 DIAGNOSIS — S72143A Displaced intertrochanteric fracture of unspecified femur, initial encounter for closed fracture: Secondary | ICD-10-CM

## 2012-10-26 LAB — BASIC METABOLIC PANEL
BUN: 7 mg/dL (ref 6–23)
CO2: 25 mEq/L (ref 19–32)
Calcium: 8.9 mg/dL (ref 8.4–10.5)
Chloride: 95 mEq/L — ABNORMAL LOW (ref 96–112)
Creatinine, Ser: 0.55 mg/dL (ref 0.50–1.10)

## 2012-10-26 LAB — CBC
HCT: 25.6 % — ABNORMAL LOW (ref 36.0–46.0)
MCH: 30.5 pg (ref 26.0–34.0)
MCV: 87.7 fL (ref 78.0–100.0)
Platelets: 607 10*3/uL — ABNORMAL HIGH (ref 150–400)
RBC: 2.92 MIL/uL — ABNORMAL LOW (ref 3.87–5.11)
WBC: 10.4 10*3/uL (ref 4.0–10.5)

## 2012-10-26 MED ORDER — OXYCODONE HCL 5 MG PO TABS: 5.0000 mg | ORAL_TABLET | ORAL | Status: DC | PRN

## 2012-10-26 MED ORDER — ALIVE WOMENS 50+ PO TABS: 1.0000 | ORAL_TABLET | Freq: Every day | ORAL | Status: DC

## 2012-10-26 MED ORDER — NICOTINE 21 MG/24HR TD PT24: 21.0000 | MEDICATED_PATCH | Freq: Every day | TRANSDERMAL | Status: DC

## 2012-10-26 MED ORDER — PANTOPRAZOLE SODIUM 40 MG PO TBEC: 40.0000 mg | DELAYED_RELEASE_TABLET | Freq: Every day | ORAL | Status: DC

## 2012-10-26 MED ORDER — ASPIRIN 325 MG PO TBEC: 325.0000 mg | DELAYED_RELEASE_TABLET | Freq: Every day | ORAL | Status: DC

## 2012-10-26 MED ORDER — ALBUTEROL SULFATE HFA 108 (90 BASE) MCG/ACT IN AERS: 1.0000 | INHALATION_SPRAY | Freq: Four times a day (QID) | RESPIRATORY_TRACT | Status: AC | PRN

## 2012-10-26 MED ORDER — FLUOXETINE HCL 40 MG PO CAPS: 80.0000 mg | ORAL_CAPSULE | Freq: Every day | ORAL | Status: DC

## 2012-10-26 MED ORDER — LEVOTHYROXINE SODIUM 25 MCG PO TABS: 25.0000 ug | ORAL_TABLET | Freq: Every day | ORAL | Status: DC

## 2012-10-26 MED ORDER — FERROUS SULFATE 325 (65 FE) MG PO TABS: 325.0000 mg | ORAL_TABLET | Freq: Two times a day (BID) | ORAL | Status: DC

## 2012-10-26 MED ORDER — METHOCARBAMOL 500 MG PO TABS: 500.0000 mg | ORAL_TABLET | Freq: Four times a day (QID) | ORAL | Status: DC | PRN

## 2012-10-26 MED ORDER — LISDEXAMFETAMINE DIMESYLATE 70 MG PO CAPS: 70.0000 mg | ORAL_CAPSULE | ORAL | Status: DC

## 2012-10-26 MED ORDER — OXYCODONE HCL 10 MG PO TABS: 10.0000 mg | ORAL_TABLET | Freq: Two times a day (BID) | ORAL | Status: DC

## 2012-10-26 NOTE — Progress Notes (Signed)
Occupational Therapy Discharge Summary  Patient Details  Name: Amy Hamilton MRN: [id number] Date of Birth: 1950-12-23  Today's Date: 10/26/2012 Time: 1445-1530 Time Calculation (min): 45 min  Patient has met 7 of 7 long term goals due to improved activity tolerance, improved balance, postural control, ability to compensate for deficits and improved attention.  Patient to discharge at overall Supervision level.  Patient's care partner is independent to provide the necessary physical assistance at discharge.    Reasons goals not met: Pt.met all goals Recommendation:  Patient will benefit from ongoing skilled OT services in home health setting to continue to advance functional skills in the area of BADL and iADL.  Equipment: tub bench, 3n1 commode chair, wc, RW  Reasons for discharge: treatment goals met and discharge from hospital  Patient/family agrees with progress made and goals achieved: Yes  OT Discharge Precautions/Restrictions  Restrictions Weight Bearing Restrictions: Yes LLE Weight Bearing: Partial weight bearing LLE Partial Weight Bearing Percentage or Pounds: 10% General   Vital Signs Therapy Vitals Temp: 98.4 F (36.9 C) Temp src: Oral Pulse Rate: 86 Resp: 18 BP: 129/85 mmHg Patient Position, if appropriate: Sitting Oxygen Therapy SpO2: 99 % O2 Device: None (Room air) Pain   ADL ADL Equipment Provided: Reacher;Leg lifter;Long-handled sponge;Sock aid Upper Body Bathing: Modified independent Where Assessed-Upper Body Bathing: Sitting at sink Lower Body Bathing: Modified independent Where Assessed-Lower Body Bathing: Sitting at sink Upper Body Dressing: Modified independent (Device) Where Assessed-Upper Body Dressing: Sitting at sink Lower Body Dressing: Modified independent Where Assessed-Lower Body Dressing: Standing at sink;Sitting at sink Toileting: Modified independent Where Assessed-Toileting: Actor Method: Surveyor, minerals: Gaffer: Modified independent Web designer Method: Ship broker: Emergency planning/management officer Vision/Perception  Vision - History Baseline Vision: Wears glasses all the time Patient Visual Report: No change from baseline Vision - Assessment Eye Alignment: Within Functional Limits Perception Perception: Within Functional Limits Praxis Praxis: Intact  Cognition Overall Cognitive Status: Appears within functional limits for tasks assessed Arousal/Alertness: Awake/alert Orientation Level: Oriented X4 Memory: Appears intact Behaviors: Restless Safety/Judgment: Appears intact Comments: appears somewhat anxious personality Sensation Sensation Light Touch:  (intact for BUE) Coordination Gross Motor Movements are Fluid and Coordinated: Yes Fine Motor Movements are Fluid and Coordinated: Yes Motor  Motor Motor: Within Functional Limits Mobility     Trunk/Postural Assessment  Cervical Assessment Cervical Assessment: Within Functional Limits Thoracic Assessment Thoracic Assessment: Within Functional Limits Lumbar Assessment Lumbar Assessment: Within Functional Limits Postural Control Postural Control: Within Functional Limits  Balance Balance Balance Assessed: No Static Sitting Balance Static Sitting - Level of Assistance: 7: Independent Dynamic Sitting Balance Dynamic Sitting - Level of Assistance: 6: Modified independent (Device/Increase time) Static Standing Balance Static Standing - Level of Assistance: 6: Modified independent (Device/Increase time) Dynamic Standing Balance Dynamic Standing - Level of Assistance: 6: Modified independent (Device/Increase time) Extremity/Trunk Assessment RUE Assessment RUE Assessment: Within Functional Limits LUE Assessment LUE Assessment: Within Functional Limits  See FIM for current functional status  Humberto Seals 10/26/2012, 6:17 PM

## 2012-10-26 NOTE — Discharge Summary (Signed)
NAMESHAMA, MONFILS NO.:  [id number]  MEDICAL RECORD NO.:  [id number]  LOCATION:  4006                         FACILITY:  [facility]  PHYSICIAN:  Mariam Dollar, P.A.  DATE OF BIRTH:  Dec 04, 1950  DATE OF ADMISSION:  10/22/2012 DATE OF DISCHARGE:                              DISCHARGE SUMMARY   DISCHARGE DIAGNOSES:  Left intertrochanteric hip fracture with intramedullary nail October 18, 2012,  Subcutaneous Lovenox for deep vein thrombosis prophylaxis pain management, depression, postoperative anemia, hypothyroidism, tobacco abuse, hypokalemia-hyponatremia.  HISTORY OF PRESENT ILLNESS:  This is a 63 year old right-handed female admitted October 17, 2012 after a fall while in the kitchen landing on her left hip without loss of consciousness.  X-rays and imaging revealed a displaced left intertrochanteric hip fracture.  Underwent intramedullary nail October 18, 2012, per Dr. August Saucer.  Advised partial weightbearing left lower extremity.  Postoperative anemia 7.9 and transfused.  Pain management postoperatively as directed.  Physical occupational therapy ongoing.  The patient was admitted for comprehensive rehab program.  PAST MEDICAL HISTORY:  See discharge diagnoses.  SOCIAL HISTORY:  Lives with 61 year old special needs son.  FUNCTIONAL HISTORY:  Prior to admission was independent.  Functional status upon admission to rehab services minimal assist to ambulate 10 feet with a rolling walker.  PHYSICAL EXAMINATION:  VITAL SIGNS:  Blood pressure 116/77, pulse 80, temperature 98.5, respirations 16. GENERAL:  This was an alert female, oriented x3. HEENT:  Pupils reactive to light. LUNGS:  Clear to auscultation. CARDIAC:  Regular rate and rhythm. ABDOMEN:  Soft, nontender.  Good bowel sounds. EXTREMITIES:  The surgical site in the left hip tender with dressing in place.  Minimal amount of serosanguineous drainage.  REHABILITATION HOSPITAL COURSE:  The patient was admitted  to inpatient rehab services with therapies initiated on a 3-hour daily basis consisting of physical therapy, occupational therapy, and rehabilitation nursing.  The following issues were addressed during the patient's rehabilitation stay.  Pertaining to Ms. Mcquitty's left intertrochanteric hip fracture remained stable.  Neurovascular sensation intact.  She would follow up with Orthopedic Services.  She remained partial weightbearing.  She had been placed on subcutaneous Lovenox for DVT prophylaxis throughout her rehab stay.  Venous Doppler studies negative. She would resume aspirin therapy at the discretion of orthopedic services on discharge.  Pain management with the use of Robaxin that was scheduled as well as oxycodone 10 mg twice daily with intermediate release every 4 hours as needed.  She remained on Prozac as well as Vyvanse 70 mg daily for history of depression with emotional support provided.  Postoperative anemia stable.  She had been transfused during her hospital course.  Latest hemoglobin of 8.9, she remained on iron supplement.  She did have a history of hypothyroidism.  She remained on hormone supplement, history of tobacco abuse with a NicoDerm patch.  She received full counseling in regard to cessation of nicotine products. It was questionable if she would be compliant with these request.  She had some mild hyponatremia 130-133 and monitored as well as hypokalemia 3.4-3.6, and would follow up with her primary MD.  The patient received weekly collaborative interdisciplinary team conferences to discuss estimated length of  stay, family teaching, and any barriers to discharge.  She was continent of bowel and bladder.  Overall supervision except moderate assist for lower body activities of daily living, steady assist overall for functional mobility.  She will be discharged to home with family with ongoing therapies dictated per rehab services.  DISCHARGE MEDICATIONS:  Albuterol  inhaler 1-2 puffs every 6 hours as needed, ferrous sulfate 325 mg p.o. b.i.d., Prozac 40 mg p.o. daily, Synthroid 25 mcg p.o. daily, Vyvanse 70 mg p.o. every day, Robaxin 500 mg every 6 hours as needed pain, multivitamin daily, Nicoderm patch taper as directed, oxycodone immediate release 5-10 mg every 4 hours as needed for pain, Protonix 40 mg daily, MiraLax 17 g daily with 8 ounces of water, hold for loose stools.  DIET:  Regular.  SPECIAL INSTRUCTIONS:  Partial weightbearing left lower extremity. Resume Ecotrin 325 mg as advised per Orthopedic Services with followup per Dr. August Saucer.  Follow up with Dr. Daleen Bo at the outpatient rehab service office as needed.  Dr. Windle Guard, medical management 2 weeks.  Call for appointment.  Followup sodium levels with primary care doctor in one week   Mariam Dollar, P.A.     DA/MEDQ  D:  10/26/2012  T:  10/26/2012  Job:  [id number]  cc:   Cammy Copa

## 2012-10-26 NOTE — Discharge Summary (Signed)
  Chart summary job [id number]

## 2012-10-26 NOTE — Progress Notes (Signed)
Subjective/Complaints: Still having A LOT of pain in left thigh. A 12 point review of systems has been performed and if not noted above is otherwise negative.   Objective: Vital Signs: Blood pressure 138/88, pulse 69, temperature 98.5 F (36.9 C), temperature source Oral, resp. rate 18, height 5\' 4"  (1.626 m), weight 55.838 kg (123 lb 1.6 oz), SpO2 98.00%. No results found. No results found for this basename: WBC, HGB, HCT, PLT,  in the last 72 hours  Recent Labs  10/25/12 0600  NA 133*  K 3.4*  CL 95*  GLUCOSE 110*  BUN 5*  CREATININE 0.47*  CALCIUM 8.9   CBG (last 3)  No results found for this basename: GLUCAP,  in the last 72 hours  Wt Readings from Last 3 Encounters:  10/24/12 55.838 kg (123 lb 1.6 oz)    Physical Exam:  Constitutional: She is oriented to person, place, and time. She appears well-developed.  HENT: mucosa pink and moist  Head: Normocephalic.  Eyes: EOM are normal.  Neck: Neck supple. No thyromegaly present. No jvd, lad  Cardiovascular: Normal rate and regular rhythm. No murmurs  Pulmonary/Chest: Breath sounds normal. No respiratory distress. She has no wheezes.  Abdominal: Soft. Bowel sounds are normal. She exhibits no distension.  Musculoskeletal:  Left hip tender. There is 2+ swelling at left thigh. She has bruising posteriorly Neurological: She is alert and oriented to person, place, and time. She has normal reflexes. She displays normal reflexes. No cranial nerve deficit. She exhibits normal muscle tone. Coordination normal.  Follows full commands. Left hip tender, affecting MMT. No focal motor sensory deficits noted otherwise. strenght in uppers is 5/5. Left lower is 1+ prox to 4/5 distally. RLE is 4 prox to 5/5 distally.  Skin:  Hip incision is clean, with minimal serosanguinous drainage. Psychiatric: She has a normal mood and affect. Her behavior is normal. Judgment and thought content normal   Assessment/Plan: 1. Functional deficits  secondary to left IT hip fx after a fall which require 3+ hours per day of interdisciplinary therapy in a comprehensive inpatient rehab setting. Physiatrist is providing close team supervision and 24 hour management of active medical problems listed below. Physiatrist and rehab team continue to assess barriers to discharge/monitor patient progress toward functional and medical goals. FIM: FIM - Bathing Bathing Steps Patient Completed: Right Arm;Left Arm;Chest;Abdomen;Front perineal area;Buttocks;Right upper leg;Left upper leg;Right lower leg (including foot);Left lower leg (including foot) Bathing: 5: Supervision: Safety issues/verbal cues  FIM - Upper Body Dressing/Undressing Upper body dressing/undressing steps patient completed: Thread/unthread right bra strap;Thread/unthread left bra strap;Hook/unhook bra;Thread/unthread right sleeve of pullover shirt/dresss;Thread/unthread left sleeve of pullover shirt/dress;Put head through opening of pull over shirt/dress;Pull shirt over trunk Upper body dressing/undressing: 5: Set-up assist to: Obtain clothing/put away FIM - Lower Body Dressing/Undressing Lower body dressing/undressing steps patient completed: Thread/unthread right underwear leg;Thread/unthread left underwear leg;Pull underwear up/down;Thread/unthread right pants leg;Thread/unthread left pants leg;Pull pants up/down;Don/Doff right sock;Don/Doff left sock Lower body dressing/undressing: 5: Set-up assist to: Obtain clothing  FIM - Toileting Toileting steps completed by patient: Adjust clothing prior to toileting;Performs perineal hygiene;Adjust clothing after toileting Toileting: 6: Assistive device: No helper  FIM - Diplomatic Services operational officer Devices: Grab bars;Walker Toilet Transfers: 6-Assistive device: No helper  FIM - Banker Devices: Therapist, occupational: 6: Assistive device: no helper;6: Supine > Sit: No assist;6: Sit  > Supine: No assist;6: Bed > Chair or W/C: No assist;6: Chair or W/C > Bed: No assist  FIM -  Locomotion: Wheelchair Locomotion: Wheelchair: 0: Activity did not occur FIM - Locomotion: Ambulation Locomotion: Ambulation Assistive Devices: Designer, industrial/product Ambulation/Gait Assistance: 6: Modified independent (Device/Increase time) Locomotion: Ambulation: 5: Household Independent - travels 50 - 149 ft independent or modified independent  Comprehension Comprehension Mode: Auditory Comprehension: 7-Follows complex conversation/direction: With no assist  Expression Expression Mode: Verbal Expression: 7-Expresses complex ideas: With no assist  Social Interaction Social Interaction: 7-Interacts appropriately with others - No medications needed.  Problem Solving Problem Solving: 7-Solves complex problems: Recognizes & self-corrects  Memory Memory: 7-Complete Independence: No helper  Medical Problem List and Plan:  1. Left intertrochanteric hip fracture. Status post intramedullary nail 10/18/2012  2. DVT Prophylaxis/Anticoagulation: dopplers negative. Increased lovenox to 30 q12 3. Pain Management: Oxycodone as needed. scheduled robaxin. Monitor with increased activity  - scheduled oxycodone at 0700 and 1200 daily has helped 4. Mood: Vyvanse 70 mg daily, Prozac 40 mg daily. Provide emotional support and positive reinforcement  5. Neuropsych: This patient is capable of making decisions on his/her own behalf.  6. Postoperative anemia. added iron supplement followup CBC after transfusion  -recheck hgb again today given increased pain, ?hematoma 7. Hypothyroidism. Synthroid  8. Tobacco abuse. NicoDerm patch. Provide counseling  9. Hypokalemia and hyponatremia.  -continue with current fluids/diet for now  -sodium trending up  -replace potassium LOS (Days) 4 A FACE TO FACE EVALUATION WAS PERFORMED  Amy Hamilton 10/26/2012 8:41 AM

## 2012-10-26 NOTE — Progress Notes (Signed)
Physical Therapy Note  Patient Details  Name: SURAYA VIDRINE MRN: [id number] Date of Birth: 07/04/1951 Today's Date: 10/26/2012  [id number] (45 minutes) individual Pain: 5/10 left hip / premedicated Focus of treatment: gait training/endurance including steps ; bilateral LE AROM/strengthening;   Treatment: Gait to/from room 120 feet distant SBA ; sit to supine mod independent using UEs to assist LT LE; bilateral LE AROM/strengthening X 20- ankle pumps , heel slides, hip ab/ad using sheet to self assist Lt LE, SAQs , standing LT hip flexion with RW support ; up/down 2 steps with RW min assist (no cueing required). Daysi Boggan,JIM 10/26/2012, 11:20 AM

## 2012-10-26 NOTE — Progress Notes (Signed)
Physical Therapy Discharge Summary  Patient Details  Name: Amy Hamilton MRN: [id number] Date of Birth: [birth year]  Today's Date: 10/26/2012 Time: 0930-1010 Time Calculation (min): 40 min Individual therapy; With encouragement, pt agreeable to complete session (reporting pain is still limiting her today and awaiting pain medication). Notified RN for pain medication. Completed gait on unit mod I for household distances, up/down stairs with RW backwards utilizing teach back method and hand out given (grandson will not be able to come in for education, but pt able to verbalize appropriate technique independently), bed mobility mod I using leg lifter, and w/c mobility mod I on unit for endurance and strengthening. Heat pack applied at end of session for pain relief to L hip. Pt reports she feels prepared for d/c and has no further questions related to PT. RW present in room from [facility] and adjusted appropriately.    Patient has met 9 of 9 long term goals due to improved activity tolerance, improved balance, increased strength, ability to compensate for deficits and functional use of  left lower extremity.  Patient to discharge at an ambulatory level Modified Independent using RW at a household level. Pain is main limiting factor for pt.  Reasons goals not met: n/a  Recommendation:  Patient will benefit from ongoing skilled PT services in home health setting to continue to advance safe functional mobility, address ongoing impairments in gait, balance, strength, edema, pain, endurance, and minimize fall risk.  Equipment: RW  Reasons for discharge: treatment goals met and discharge from hospital  Patient/family agrees with progress made and goals achieved: Yes  PT Discharge Precautions/Restrictions Precautions Precautions: Fall Restrictions Weight Bearing Restrictions: Yes LLE Weight Bearing: Partial weight bearing LLE Partial Weight Bearing Percentage or Pounds: 10% Pain  c/o L hip pain -  RN notified for pain medication.  Vision/Perception  Perception Perception: Within Functional Limits Praxis Praxis: Intact  Cognition Overall Cognitive Status: Appears within functional limits for tasks assessed Memory: Appears intact Awareness: Appears intact Problem Solving: Appears intact Safety/Judgment: Appears intact Sensation Sensation Light Touch: Impaired Detail Light Touch Impaired Details: Impaired LLE Coordination Gross Motor Movements are Fluid and Coordinated: Yes Motor  Motor Motor: Within Functional Limits    Locomotion  Ambulation Ambulation/Gait Assistance: 6: Modified independent (Device/Increase time)  Trunk/Postural Assessment  Cervical Assessment Cervical Assessment: Within Functional Limits Thoracic Assessment Thoracic Assessment: Within Functional Limits Lumbar Assessment Lumbar Assessment: Within Functional Limits  Balance Balance Balance Assessed: Yes Static Sitting Balance Static Sitting - Level of Assistance: 7: Independent Dynamic Sitting Balance Dynamic Sitting - Level of Assistance: 6: Modified independent (Device/Increase time) Static Standing Balance Static Standing - Level of Assistance: 6: Modified independent (Device/Increase time) Dynamic Standing Balance Dynamic Standing - Level of Assistance: 6: Modified independent (Device/Increase time) Extremity Assessment      RLE Assessment RLE Assessment: Within Functional Limits LLE Assessment LLE Assessment: Exceptions to [facility] LLE Strength LLE Overall Strength Comments: PROM WFL; hip 3/5; knee and ankle WFL; pain limiting  See FIM for current functional status  Karolee Stamps [facility] 10/26/2012, 12:10 PM

## 2012-10-26 NOTE — Progress Notes (Signed)
Occupational Therapy Session Note  Patient Details  Name: Amy Hamilton MRN: [id number] Date of Birth: [birth year]  Today's Date: 10/26/2012 Time:  -   0800-0900  ( )  1st session    Short Term Goals: Week 1:  OT Short Term Goal 1 (Week 1): Short Term Goals = Long Term Goals secondary to ELOS Week 2:     Skilled Therapeutic Interventions/Progress Updates:      1st session:  No pain except quiziness in stomach.  Pt. Lying in bed upnd OT arrival.  Pt. Reported her stomach was upset from caffeine.  She stated her pain 3/10.  Pt. Did not want to shower, but agreed to sponge bath.  Pt. Ambulated with RW to bathroom and performed toileting at mod I level.  Pt. perfomed dressing EOB using sock aid with Mod I.  She obtained supplies with no LOB.    Pt. Left in room with call bell close by.    2 nd session :  Leg pain=  5/ 10   Time:  1445-1530  ( )  Individual session.   Pt. Slightly anxious about discharge on Saturday.  Pt. Refused to practice going to kitchen and preparing jello.  Discussed discharge situation.  Pt demonstrating decreased attention to various tasks and needing cues to find things.  Grandson Destin arrived.  Did family education on transfers and steps using RW.  Destin demonstrated independence and understanding.    Therapy Documentation Precautions: a Precautions Precautions: Fall Restrictions Weight Bearing Restrictions: Yes LLE Weight Bearing: Weight bearing as tolerated LLE Partial Weight Bearing Percentage or Pounds: 10%   Pain: Pain Assessment Pain Assessment: 0-10 Pain Score:  6 Pain Type: Acute pain Pain Location: Leg Pain Orientation: Left Pain Descriptors: Aching Pain Frequency: Intermittent Pain Onset: Gradual Patients Stated Pain Goal 2       Other Treatments:    See FIM for current functional status  Therapy/Group: Individual Therapy  Humberto Seals 10/26/2012, 8:20 AM

## 2012-10-27 DIAGNOSIS — E871 Hypo-osmolality and hyponatremia: Secondary | ICD-10-CM

## 2012-10-27 DIAGNOSIS — W19XXXA Unspecified fall, initial encounter: Secondary | ICD-10-CM

## 2012-10-27 DIAGNOSIS — S72143A Displaced intertrochanteric fracture of unspecified femur, initial encounter for closed fracture: Secondary | ICD-10-CM

## 2012-10-27 DIAGNOSIS — D62 Acute posthemorrhagic anemia: Secondary | ICD-10-CM

## 2012-10-27 NOTE — Progress Notes (Signed)
Patient ID: Amy Hamilton, female   DOB: [birth year], 62 y.o.   MRN: [id number] Subjective/Complaints: Left thigh pain only with movement A 12 point review of systems has been performed and if not noted above is otherwise negative.   Objective: Vital Signs: Blood pressure 137/85, pulse 78, temperature 98.1 F (36.7 C), temperature source Oral, resp. rate 20, height 5\' 4"  (1.626 m), weight 55.838 kg (123 lb 1.6 oz), SpO2 98.00%. No results found.  Recent Labs  10/26/12 0841  WBC 10.4  HGB 8.9*  HCT 25.6*  PLT 607*    Recent Labs  10/25/12 0600 10/26/12 0841  NA 133* 130*  K 3.4* 3.6  CL 95* 95*  GLUCOSE 110* 115*  BUN 5* 7  CREATININE 0.47* 0.55  CALCIUM 8.9 8.9   CBG (last 3)  No results found for this basename: GLUCAP,  in the last 72 hours  Wt Readings from Last 3 Encounters:  10/24/12 55.838 kg (123 lb 1.6 oz)    Physical Exam:  Constitutional: She is oriented to person, place, and time. She appears well-developed.  HENT: mucosa pink and moist  Head: Normocephalic.  Eyes: EOM are normal.  Neck: Neck supple. No thyromegaly present. No jvd, lad  Cardiovascular: Normal rate and regular rhythm. No murmurs  Pulmonary/Chest: Breath sounds normal. No respiratory distress. She has no wheezes.  Abdominal: Soft. Bowel sounds are normal. She exhibits no distension.  Musculoskeletal:  Left hip tender. There is 2+ swelling at left thigh. She has bruising posteriorly Neurological: She is alert and oriented to person, place, and time. She has normal reflexes. She displays normal reflexes. No cranial nerve deficit. She exhibits normal muscle tone. Coordination normal.  Follows full commands. Left hip tender, affecting MMT. No focal motor sensory deficits noted otherwise. strenght in uppers is 5/5. Left lower is 3- prox to 4/5 distally.able to lift L heel off bed RLE is 4 prox to 5/5 distally.  Skin:  Hip incision is clean, with minimal serosanguinous drainage. Psychiatric: She  has a normal mood and affect. Her behavior is normal. Judgment and thought content normal   Assessment/Plan: 1. Functional deficits secondary to left IT hip fx after a fall Stable for D/C today F/u PCP in 1-2 weeks F/u PM&R 3 weeks See D/C summary See D/C instructionsFIM: FIM - Bathing Bathing Steps Patient Completed: Right Arm;Left Arm;Chest;Abdomen;Front perineal area;Buttocks;Right upper leg;Left upper leg;Right lower leg (including foot);Left lower leg (including foot) Bathing: 6: Assistive device (Comment)  FIM - Upper Body Dressing/Undressing Upper body dressing/undressing steps patient completed: Thread/unthread right sleeve of pullover shirt/dresss;Thread/unthread left sleeve of pullover shirt/dress;Put head through opening of pull over shirt/dress;Pull shirt over trunk Upper body dressing/undressing: 6: More than reasonable amount of time FIM - Lower Body Dressing/Undressing Lower body dressing/undressing steps patient completed: Thread/unthread right underwear leg;Thread/unthread left underwear leg;Pull underwear up/down;Thread/unthread right pants leg;Thread/unthread left pants leg;Pull pants up/down;Don/Doff right sock;Don/Doff left sock Lower body dressing/undressing: 6: More than reasonable amount of time  FIM - Toileting Toileting steps completed by patient: Adjust clothing prior to toileting;Performs perineal hygiene;Adjust clothing after toileting Toileting Assistive Devices: Grab bar or rail for support Toileting: 6: Assistive device: No helper  FIM - Diplomatic Services operational officer Devices: Grab bars;Walker Toilet Transfers: 6-Assistive device: No helper  FIM - Banker Devices: Environmental consultant;Arm rests Bed/Chair Transfer: 6: Assistive device: no helper;6: Supine > Sit: No assist;6: Sit > Supine: No assist;6: Bed > Chair or W/C: No assist;6: Chair or W/C > Bed: No  assist  FIM - Locomotion: Wheelchair Locomotion:  Wheelchair: 6: Travels 150 ft or more, turns around, maneuvers to table, bed or toilet, negotiates 3% grade: maneuvers on rugs and over door sills independently FIM - Locomotion: Ambulation Locomotion: Ambulation Assistive Devices: Designer, industrial/product Ambulation/Gait Assistance: 6: Modified independent (Device/Increase time) Locomotion: Ambulation: 5: Household Independent - travels 50 - 149 ft independent or modified independent  Comprehension Comprehension Mode: Auditory Comprehension: 7-Follows complex conversation/direction: With no assist  Expression Expression Mode: Verbal Expression: 7-Expresses complex ideas: With no assist  Social Interaction Social Interaction: 7-Interacts appropriately with others - No medications needed.  Problem Solving Problem Solving: 7-Solves complex problems: Recognizes & self-corrects  Memory Memory: 7-Complete Independence: No helper  Medical Problem List and Plan:  1. Left intertrochanteric hip fracture. Status post intramedullary nail 10/18/2012  2. DVT Prophylaxis/Anticoagulation: dopplers negative. D/C lovenox 3. Pain Management: Oxycodone as needed. scheduled robaxin. Monitor with increased activity  - scheduled oxycodone at 0700 and 1200 daily has helped 4. Mood: Vyvanse 70 mg daily, Prozac 40 mg daily. Provide emotional support and positive reinforcement  5. Neuropsych: This patient is capable of making decisions on his/her own behalf.  6. Postoperative anemia. added iron supplement followup CBC after transfusion  -recheck hgb again today given increased pain, ?hematoma 7. Hypothyroidism. Synthroid  8. Tobacco abuse. NicoDerm patch. Provide counseling  9. Hypokalemia and hyponatremia.  -continue with current fluids/diet for now  -sodium trending up  -replace potassium LOS (Days) 5 A FACE TO FACE EVALUATION WAS PERFORMED  Erick Colace 10/27/2012 7:35 AM

## 2012-10-29 NOTE — Progress Notes (Signed)
Social Work  Discharge Note  The overall goal for the admission was met for:   Discharge location: Yes - home with family providing 24/7 assist  Length of Stay: Yes - 5 days  Discharge activity level: Yes - modified independent to supervision  Home/community participation: Yes  Services provided included: MD, RD, PT, OT, RN, Pharmacy and SW  Financial Services: Medicaid  Follow-up services arranged: Home Health: PT via [facility], DME: [phone] Breezy w/c with ELR, cushion, rolling walker, 3n1 commode, tub bench via [facility] and Patient/Family has no preference for HH/DME agencies  Comments (or additional information):  Patient/Family verbalized understanding of follow-up arrangements: Yes  Individual responsible for coordination of the follow-up plan: patient  Confirmed correct DME delivered: Amy Hamilton 10/29/2012    Amy Hamilton

## 2013-04-22 ENCOUNTER — Other Ambulatory Visit: Payer: Self-pay | Admitting: Orthopedic Surgery

## 2013-04-22 DIAGNOSIS — M25519 Pain in unspecified shoulder: Secondary | ICD-10-CM

## 2013-04-26 ENCOUNTER — Ambulatory Visit
Admission: RE | Admit: 2013-04-26 | Discharge: 2013-04-26 | Disposition: A | Discharge status: Home / Self Care | Payer: Medicaid Other | Source: Ambulatory Visit | Attending: Orthopedic Surgery | Admitting: Orthopedic Surgery

## 2013-04-26 DIAGNOSIS — M25519 Pain in unspecified shoulder: Secondary | ICD-10-CM

## 2013-06-14 ENCOUNTER — Ambulatory Visit: Payer: Medicaid Other

## 2013-07-08 ENCOUNTER — Emergency Department (HOSPITAL_COMMUNITY): Payer: Medicaid Other

## 2013-07-08 ENCOUNTER — Emergency Department (HOSPITAL_COMMUNITY)
Admission: EM | Admit: 2013-07-08 | Discharge: 2013-07-08 | Disposition: A | Discharge status: Home / Self Care | Payer: Medicaid Other | Attending: Emergency Medicine | Admitting: Emergency Medicine

## 2013-07-08 ENCOUNTER — Encounter (HOSPITAL_COMMUNITY): Payer: Self-pay | Admitting: Emergency Medicine

## 2013-07-08 DIAGNOSIS — E079 Disorder of thyroid, unspecified: Secondary | ICD-10-CM | POA: Insufficient documentation

## 2013-07-08 DIAGNOSIS — Z79899 Other long term (current) drug therapy: Secondary | ICD-10-CM | POA: Insufficient documentation

## 2013-07-08 DIAGNOSIS — Z8742 Personal history of other diseases of the female genital tract: Secondary | ICD-10-CM | POA: Insufficient documentation

## 2013-07-08 DIAGNOSIS — F329 Major depressive disorder, single episode, unspecified: Secondary | ICD-10-CM | POA: Insufficient documentation

## 2013-07-08 DIAGNOSIS — Z8739 Personal history of other diseases of the musculoskeletal system and connective tissue: Secondary | ICD-10-CM | POA: Insufficient documentation

## 2013-07-08 DIAGNOSIS — F17200 Nicotine dependence, unspecified, uncomplicated: Secondary | ICD-10-CM | POA: Insufficient documentation

## 2013-07-08 DIAGNOSIS — R109 Unspecified abdominal pain: Secondary | ICD-10-CM | POA: Insufficient documentation

## 2013-07-08 DIAGNOSIS — R110 Nausea: Secondary | ICD-10-CM | POA: Insufficient documentation

## 2013-07-08 DIAGNOSIS — F3289 Other specified depressive episodes: Secondary | ICD-10-CM | POA: Insufficient documentation

## 2013-07-08 DIAGNOSIS — G8929 Other chronic pain: Secondary | ICD-10-CM | POA: Insufficient documentation

## 2013-07-08 HISTORY — DX: Other chronic pain: G89.29

## 2013-07-08 HISTORY — DX: Other cervical disc degeneration, unspecified cervical region: M50.30

## 2013-07-08 HISTORY — DX: Uterovaginal prolapse, unspecified: N81.4

## 2013-07-08 HISTORY — DX: Dorsalgia, unspecified: M54.9

## 2013-07-08 LAB — CBC WITH DIFFERENTIAL/PLATELET
Basophils Absolute: 0 10*3/uL (ref 0.0–0.1)
Basophils Relative: 0 % (ref 0–1)
HCT: 36.5 % (ref 36.0–46.0)
MCHC: 34.2 g/dL (ref 30.0–36.0)
Monocytes Absolute: 0.4 10*3/uL (ref 0.1–1.0)
Neutro Abs: 3.5 10*3/uL (ref 1.7–7.7)
Platelets: 348 10*3/uL (ref 150–400)
RDW: 12.5 % (ref 11.5–15.5)

## 2013-07-08 LAB — URINALYSIS, ROUTINE W REFLEX MICROSCOPIC
Glucose, UA: NEGATIVE mg/dL
Ketones, ur: NEGATIVE mg/dL
Leukocytes, UA: NEGATIVE
Nitrite: NEGATIVE
Protein, ur: NEGATIVE mg/dL
Urobilinogen, UA: 0.2 mg/dL (ref 0.0–1.0)

## 2013-07-08 LAB — COMPREHENSIVE METABOLIC PANEL
BUN: 9 mg/dL (ref 6–23)
CO2: 24 mEq/L (ref 19–32)
Calcium: 8.9 mg/dL (ref 8.4–10.5)
Chloride: 98 mEq/L (ref 96–112)
Creatinine, Ser: 0.49 mg/dL — ABNORMAL LOW (ref 0.50–1.10)
GFR calc Af Amer: >90 mL/min (ref >90)
GFR calc non Af Amer: >90 mL/min (ref >90)
Glucose, Bld: 85 mg/dL (ref 70–99)
Total Bilirubin: <0.1 mg/dL — ABNORMAL LOW (ref 0.3–1.2)

## 2013-07-08 LAB — URINE MICROSCOPIC-ADD ON

## 2013-07-08 LAB — LIPASE, BLOOD: Lipase: 20 U/L (ref 11–59)

## 2013-07-08 MED ORDER — IOHEXOL 300 MG/ML  SOLN
100.0000 mL | Freq: Once | INTRAMUSCULAR | Status: AC | PRN
  Administered 2013-07-08: 100 mL via INTRAVENOUS

## 2013-07-08 MED ORDER — ONDANSETRON HCL 4 MG/2ML IJ SOLN
4.0000 mg | INTRAMUSCULAR | Status: DC | PRN
  Administered 2013-07-08: 4 mg via INTRAVENOUS
  Filled 2013-07-08: qty 2

## 2013-07-08 MED ORDER — MORPHINE SULFATE 4 MG/ML IJ SOLN
4.0000 mg | INTRAMUSCULAR | Status: DC | PRN
  Administered 2013-07-08: 4 mg via INTRAVENOUS
  Filled 2013-07-08: qty 1

## 2013-07-08 MED ORDER — IOHEXOL 300 MG/ML  SOLN: 25.0000 mL | INTRAMUSCULAR | Status: AC

## 2013-07-08 MED ORDER — SODIUM CHLORIDE 0.9 % IV SOLN
INTRAVENOUS | Status: DC
  Administered 2013-07-08: 12:00:00 via INTRAVENOUS

## 2013-07-08 NOTE — ED Provider Notes (Signed)
CSN: [id number]     Arrival date & time 07/08/13  1106 History   First MD Initiated Contact with Patient 07/08/13 1132     Chief Complaint  Patient presents with  . side pain    . Abdominal Pain    HPI Pt was seen at 1135.  Per pt, c/o gradual onset and persistence of constant lateral torso/abd "pain" for the past 3 months. States the right sided of her torso started to hurt 3 months ago, and the left side started 3 days ago. Cannot recall injury. Describes the abd pain as "tightness," worsens with palpation of the area and body position changes. Has been associated with mild nausea.  Denies vomiting/diarrhea, no fevers, no back pain, no rash, no CP/SOB, no black or blood in stools.      Past Medical History  Diagnosis Date  . Thyroid disease   . Depression   . Uterine prolapse   . DDD (degenerative disc disease), cervical   . Chronic back pain    Past Surgical History  Procedure Laterality Date  . Femur im nail Left 10/18/2012    Procedure: INTRAMEDULLARY (IM) NAIL FEMORAL;  Surgeon: Cammy Copa, MD;  Location: [facility] OR;  Service: Orthopedics;  Laterality: Left;  . Abdominal hysterectomy      History  Substance Use Topics  . Smoking status: Current Every Day Smoker -- 0.50 packs/day    Types: Cigarettes  . Smokeless tobacco: Not on file  . Alcohol Use: No    Review of Systems ROS: Statement: All systems negative except as marked or noted in the HPI; Constitutional: Negative for fever and chills. ; ; Eyes: Negative for eye pain, redness and discharge. ; ; ENMT: Negative for ear pain, hoarseness, nasal congestion, sinus pressure and sore throat. ; ; Cardiovascular: Negative for chest pain, palpitations, diaphoresis, dyspnea and peripheral edema. ; ; Respiratory: Negative for cough, wheezing and stridor. ; ; Gastrointestinal: +bilateral torso pain, nausea. Negative for vomiting, diarrhea, blood in stool, hematemesis, jaundice and rectal bleeding. ; ; Genitourinary: Negative for  dysuria, flank pain and hematuria. ; ; Musculoskeletal: Negative for back pain and neck pain. Negative for swelling and trauma.; ; Skin: Negative for pruritus, rash, abrasions, blisters, bruising and skin lesion.; ; Neuro: Negative for headache, lightheadedness and neck stiffness. Negative for weakness, altered level of consciousness , altered mental status, extremity weakness, paresthesias, involuntary movement, seizure and syncope.      Allergies  Review of patient's allergies indicates no known allergies.  Home Medications   Current Outpatient Rx  Name  Route  Sig  Dispense  Refill  . albuterol (PROVENTIL HFA;VENTOLIN HFA) 108 (90 BASE) MCG/ACT inhaler   Inhalation   Inhale 1-2 puffs into the lungs every 6 (six) hours as needed for wheezing or shortness of breath.   1 Inhaler   0   . Aspirin-Acetaminophen-Caffeine (GOODY HEADACHE PO)   Oral   Take 1 packet by mouth once.         Marland Kitchen FLUoxetine (PROZAC) 40 MG capsule   Oral   Take 2 capsules (80 mg total) by mouth daily.   60 capsule   1   . levothyroxine (SYNTHROID, LEVOTHROID) 25 MCG tablet   Oral   Take 1 tablet (25 mcg total) by mouth daily.   30 tablet   1   . lisdexamfetamine (VYVANSE) 70 MG capsule   Oral   Take 1 capsule (70 mg total) by mouth every morning.   30 capsule   0  BP 139/76  Pulse 59  Temp(Src) 97.7 F (36.5 C) (Oral)  Resp 16  Ht 5\' 3"  (1.6 m)  Wt 122 lb (55.339 kg)  BMI 21.62 kg/m2  SpO2 100% Physical Exam 1140: Physical examination:  Nursing notes reviewed; Vital signs and O2 SAT reviewed;  Constitutional: Well developed, Well nourished, Well hydrated, In no acute distress; Head:  Normocephalic, atraumatic; Eyes: EOMI, PERRL, No scleral icterus; ENMT: Mouth and pharynx normal, Mucous membranes moist; Neck: Supple, Full range of motion, No lymphadenopathy; Cardiovascular: Regular rate and rhythm, No gallop; Respiratory: Breath sounds clear & equal bilaterally, No wheezes.  Speaking full  sentences with ease, Normal respiratory effort/excursion; Chest: Nontender, Movement normal; Abdomen: Soft, +right < left lateral torso areas tender to palp. No rebound or guarding. Nondistended, Normal bowel sounds; Genitourinary: No CVA tenderness; Spine:  No midline CS, TS, LS tenderness.;; Extremities: Pulses normal, No tenderness, No edema, No calf edema or asymmetry.; Neuro: AA&Ox3, Major CN grossly intact.  Speech clear. Climbs on and off stretcher easily by herself. Gait steady. No gross focal motor or sensory deficits in extremities.; Skin: Color normal, Warm, Dry.   ED Course  Procedures   EKG Interpretation   None       MDM  MDM Reviewed: previous chart, nursing note and vitals Reviewed previous: labs Interpretation: labs, x-ray and CT scan     Results for orders placed during the hospital encounter of 07/08/13  COMPREHENSIVE METABOLIC PANEL      Result Value Range   Sodium 135  135 - 145 mEq/L   Potassium 5.4 (*) 3.5 - 5.1 mEq/L   Chloride 98  96 - 112 mEq/L   CO2 24  19 - 32 mEq/L   Glucose, Bld 85  70 - 99 mg/dL   BUN 9  6 - 23 mg/dL   Creatinine, Ser 4.69 (*) 0.50 - 1.10 mg/dL   Calcium 8.9  8.4 - 62.9 mg/dL   Total Protein 7.3  6.0 - 8.3 g/dL   Albumin 3.6  3.5 - 5.2 g/dL   AST 33  0 - 37 U/L   ALT 14  0 - 35 U/L   Alkaline Phosphatase 80  39 - 117 U/L   Total Bilirubin <0.1 (*) 0.3 - 1.2 mg/dL   GFR calc non Af Amer >90  >90 mL/min   GFR calc Af Amer >90  >90 mL/min  LIPASE, BLOOD      Result Value Range   Lipase 20  11 - 59 U/L  URINALYSIS, ROUTINE W REFLEX MICROSCOPIC      Result Value Range   Color, Urine YELLOW  YELLOW   APPearance CLEAR  CLEAR   Specific Gravity, Urine 1.013  1.005 - 1.030   pH 7.5  5.0 - 8.0   Glucose, UA NEGATIVE  NEGATIVE mg/dL   Hgb urine dipstick SMALL (*) NEGATIVE   Bilirubin Urine NEGATIVE  NEGATIVE   Ketones, ur NEGATIVE  NEGATIVE mg/dL   Protein, ur NEGATIVE  NEGATIVE mg/dL   Urobilinogen, UA 0.2  0.0 - 1.0 mg/dL    Nitrite NEGATIVE  NEGATIVE   Leukocytes, UA NEGATIVE  NEGATIVE  CBC WITH DIFFERENTIAL      Result Value Range   WBC 6.3  4.0 - 10.5 K/uL   RBC 3.93  3.87 - 5.11 MIL/uL   Hemoglobin 12.5  12.0 - 15.0 g/dL   HCT 52.8  41.3 - 24.4 %   MCV 92.9  78.0 - 100.0 fL   MCH 31.8  26.0 -  34.0 pg   MCHC 34.2  30.0 - 36.0 g/dL   RDW 69.6  29.5 - 28.4 %   Platelets 348  150 - 400 K/uL   Neutrophils Relative % 56  43 - 77 %   Neutro Abs 3.5  1.7 - 7.7 K/uL   Lymphocytes Relative 34  12 - 46 %   Lymphs Abs 2.1  0.7 - 4.0 K/uL   Monocytes Relative 6  3 - 12 %   Monocytes Absolute 0.4  0.1 - 1.0 K/uL   Eosinophils Relative 4  0 - 5 %   Eosinophils Absolute 0.2  0.0 - 0.7 K/uL   Basophils Relative 0  0 - 1 %   Basophils Absolute 0.0  0.0 - 0.1 K/uL  URINE MICROSCOPIC-ADD ON      Result Value Range   Squamous Epithelial / LPF RARE  RARE   RBC / HPF 3-6  <3 RBC/hpf   Dg Chest 2 View 07/08/2013   CLINICAL DATA:  Left flank pain for 7 days.  Smoker.  EXAM: CHEST  2 VIEW  COMPARISON:  10/17/2012 and 02/18/2007.  FINDINGS: No infiltrate, congestive heart failure or pneumothorax.  No plain film evidence of pulmonary malignancy. If this were of high clinical concern, CT chest may be considered.  Calcified mildly tortuous aorta.  Heart size within normal limits.  Small hiatal hernia.  Acromioclavicular joint degenerative changes.  Scoliosis thoracic spine with degenerative changes.  IMPRESSION: No infiltrate, congestive heart failure or pneumothorax.  Calcified mildly tortuous aorta.  Small hiatal hernia.  Please see above.   Electronically Signed   By: Bridgett Larsson M.D.   On: 07/08/2013 13:03   Ct Abdomen Pelvis W Contrast  07/08/2013   CLINICAL DATA:  Abdominal pain, status post hysterectomy  EXAM: CT ABDOMEN AND PELVIS WITH CONTRAST  TECHNIQUE: Multidetector CT imaging of the abdomen and pelvis was performed using the standard protocol following bolus administration of intravenous contrast.  CONTRAST:   OMNIPAQUE IOHEXOL 300 MG/ML  SOLN  COMPARISON:  10/17/2006  FINDINGS: Lung bases are unremarkable. Moderate size hiatal hernia. There are metallic artifacts from left hip prosthesis. Enhanced liver is unremarkable. Small layering gallstones or sludge noted within dependent gallbladder. Atherosclerotic calcifications of abdominal aorta. No AA aortic aneurysm. CBD measures 7 mm in diameter.  Significant stool noted in right colon and transverse colon. No pericecal inflammation. No small bowel obstruction. No ascites or free air. No adenopathy. Normal appendix partially visualized axial image 61. A distended urinary bladder is noted. The patient is status post hysterectomy. No inguinal adenopathy.  Sagittal images of the spine shows degenerative changes lumbar spine.  Kidneys are symmetrical in size and enhancement. No hydronephrosis or hydroureter. There is a cyst lower pole of the right kidney measures 2.1 cm.  Delayed renal images shows bilateral renal symmetrical excretion.  IMPRESSION: 1. Small hiatal hernia. 2. Abundant stool noted in right colon and transverse colon. No pericecal inflammation. Normal appendix. 3. No small bowel obstruction. 4. Status post hysterectomy. 5. 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   Electronically Signed   By: Natasha Mead M.D.   On: 07/08/2013 14:34    1445:  Pt has tol PO well without N/V. No stooling while in the ED. Has been ambulatory while in the ED with steady gait. Workup reassuring. Pt wants to go home now. Tx symptomatically at this time. Dx and testing d/w pt.  Questions answered.  Verb understanding, agreeable to d/c home with outpt f/u.  Laray Anger, DO 07/10/13 [phone]

## 2013-07-08 NOTE — ED Notes (Signed)
Rn explained need for IV access to pt. Pt cursing about pain, and about staff members. RN attempting to calm pt. Down. Pt sts "no one understand my f-ing pain and you are all just

## 2013-07-08 NOTE — ED Notes (Signed)
Pt c/o bil side pain and L arm pain that increases with movement.  Pt states R side pain x 1 month and L side pain x 3 days.  C/o some nausea, but denies emesis or changes in bowel or bladder habits.

## 2013-07-29 ENCOUNTER — Ambulatory Visit: Payer: Medicaid Other

## 2013-11-28 ENCOUNTER — Ambulatory Visit (INDEPENDENT_AMBULATORY_CARE_PROVIDER_SITE_OTHER): Payer: Medicaid Other | Admitting: General Surgery

## 2014-01-01 ENCOUNTER — Ambulatory Visit (INDEPENDENT_AMBULATORY_CARE_PROVIDER_SITE_OTHER): Payer: Medicaid Other | Admitting: General Surgery

## 2014-04-11 ENCOUNTER — Encounter (HOSPITAL_COMMUNITY): Payer: Self-pay | Admitting: Emergency Medicine

## 2014-04-11 ENCOUNTER — Emergency Department (HOSPITAL_COMMUNITY): Payer: Medicaid Other

## 2014-04-11 ENCOUNTER — Emergency Department (HOSPITAL_COMMUNITY)
Admission: EM | Admit: 2014-04-11 | Discharge: 2014-04-11 | Disposition: A | Discharge status: Home / Self Care | Payer: Medicaid Other | Attending: Emergency Medicine | Admitting: Emergency Medicine

## 2014-04-11 DIAGNOSIS — Z8742 Personal history of other diseases of the female genital tract: Secondary | ICD-10-CM | POA: Diagnosis not present

## 2014-04-11 DIAGNOSIS — F329 Major depressive disorder, single episode, unspecified: Secondary | ICD-10-CM | POA: Diagnosis not present

## 2014-04-11 DIAGNOSIS — Y9389 Activity, other specified: Secondary | ICD-10-CM | POA: Insufficient documentation

## 2014-04-11 DIAGNOSIS — W19XXXA Unspecified fall, initial encounter: Secondary | ICD-10-CM

## 2014-04-11 DIAGNOSIS — F3289 Other specified depressive episodes: Secondary | ICD-10-CM | POA: Insufficient documentation

## 2014-04-11 DIAGNOSIS — S42202A Unspecified fracture of upper end of left humerus, initial encounter for closed fracture: Secondary | ICD-10-CM

## 2014-04-11 DIAGNOSIS — Y9289 Other specified places as the place of occurrence of the external cause: Secondary | ICD-10-CM | POA: Diagnosis not present

## 2014-04-11 DIAGNOSIS — M5030 Other cervical disc degeneration, unspecified cervical region: Secondary | ICD-10-CM | POA: Diagnosis not present

## 2014-04-11 DIAGNOSIS — E079 Disorder of thyroid, unspecified: Secondary | ICD-10-CM | POA: Diagnosis not present

## 2014-04-11 DIAGNOSIS — X500XXA Overexertion from strenuous movement or load, initial encounter: Secondary | ICD-10-CM | POA: Insufficient documentation

## 2014-04-11 DIAGNOSIS — S46909A Unspecified injury of unspecified muscle, fascia and tendon at shoulder and upper arm level, unspecified arm, initial encounter: Secondary | ICD-10-CM | POA: Insufficient documentation

## 2014-04-11 DIAGNOSIS — F17200 Nicotine dependence, unspecified, uncomplicated: Secondary | ICD-10-CM | POA: Insufficient documentation

## 2014-04-11 DIAGNOSIS — S4980XA Other specified injuries of shoulder and upper arm, unspecified arm, initial encounter: Secondary | ICD-10-CM | POA: Insufficient documentation

## 2014-04-11 DIAGNOSIS — G8929 Other chronic pain: Secondary | ICD-10-CM | POA: Diagnosis not present

## 2014-04-11 DIAGNOSIS — Z79899 Other long term (current) drug therapy: Secondary | ICD-10-CM | POA: Insufficient documentation

## 2014-04-11 DIAGNOSIS — S42213A Unspecified displaced fracture of surgical neck of unspecified humerus, initial encounter for closed fracture: Secondary | ICD-10-CM | POA: Diagnosis not present

## 2014-04-11 MED ORDER — OXYCODONE-ACETAMINOPHEN 5-325 MG PO TABS
1.0000 | ORAL_TABLET | Freq: Once | ORAL | Status: DC
Start: 2014-04-11 — End: 2014-04-11
  Filled 2014-04-11: qty 1

## 2014-04-11 MED ORDER — OXYCODONE-ACETAMINOPHEN 5-325 MG PO TABS: 1.0000 | ORAL_TABLET | Freq: Three times a day (TID) | ORAL | Status: DC | PRN

## 2014-04-11 MED ORDER — HYDROCODONE-ACETAMINOPHEN 5-325 MG PO TABS
1.0000 | ORAL_TABLET | Freq: Once | ORAL | Status: AC
  Administered 2014-04-11: 1 via ORAL
  Filled 2014-04-11: qty 1

## 2014-04-11 NOTE — ED Notes (Signed)
Pt reports taking 7.5mg  hydrocodone for the pain.

## 2014-04-11 NOTE — ED Notes (Signed)
Gave pt coffee

## 2014-04-11 NOTE — ED Provider Notes (Signed)
CSN: [id number]     Arrival date & time 04/11/14  0618 History   First MD Initiated Contact with Patient 04/11/14 [phone]     Chief Complaint  Patient presents with  . Shoulder Pain     (Consider location/radiation/quality/duration/timing/severity/associated sxs/prior Treatment) The history is provided by the patient. No language interpreter was used.  Amy Hamilton is a 63 y/o F with PMHx of thyroid disease, depression, uterine prolapse, DDD, chronic back pain presenting to the ED with left shoulder pain after a fall that occurred this morning at approximately 5:00AM. Patient reported that she was sleeping with her grandson in her arms and stated that when she went to grab the bottle for him with her left hand she rolled off the couch and landed on her left shoulder directly on the hard wood floor. Reported that she has been having pain in her left shoulder since this event - stated that the pain is an agonizing pain with radiation down her left arm to her hand. Reported that she has been experiencing mild neck pain described as a dull aching, pulling sensation. Reported that she took a Vicodin at 5:10AM stated mild relief. Denied numbness, tingling, loss of sensation, head injury, loss of consciousness, blurred vision, sudden loss of vision, previous injury to the arm, chest pain, shortness of breath, difficulty breathing, nausea, vomiting, dizziness. PCP Dr. Arelia Sneddon  Past Medical History  Diagnosis Date  . Thyroid disease   . Depression   . Uterine prolapse   . DDD (degenerative disc disease), cervical   . Chronic back pain    Past Surgical History  Procedure Laterality Date  . Femur im nail Left 10/18/2012    Procedure: INTRAMEDULLARY (IM) NAIL FEMORAL;  Surgeon: Meredith Pel, MD;  Location: [place];  Service: Orthopedics;  Laterality: Left;  . Abdominal hysterectomy    . Back surgery     No family history on file. History  Substance Use Topics  . Smoking status: Current Every Day  Smoker -- 0.50 packs/day    Types: Cigarettes  . Smokeless tobacco: Not on file  . Alcohol Use: No   OB History   Grav Para Term Preterm Abortions TAB SAB Ect Mult Living                 Review of Systems  Musculoskeletal: Positive for arthralgias (left shoulder).  Neurological: Negative for dizziness, weakness, numbness and headaches.      Allergies  Review of patient's allergies indicates no known allergies.  Home Medications   Prior to Admission medications   Medication Sig Start Date End Date Taking? Authorizing Provider  albuterol (PROVENTIL HFA;VENTOLIN HFA) 108 (90 BASE) MCG/ACT inhaler Inhale 1-2 puffs into the lungs every 6 (six) hours as needed for wheezing or shortness of breath. 10/26/12  Yes Daniel J Angiulli, PA-C  Aspirin-Acetaminophen-Caffeine (GOODY HEADACHE PO) Take 1 packet by mouth daily as needed (pain).    Yes Historical Provider, MD  BEE POLLEN PO Take 2 tablets by mouth daily.   Yes Historical Provider, MD  FLUoxetine (PROZAC) 40 MG capsule Take 2 capsules (80 mg total) by mouth daily. 10/26/12  Yes Daniel J Angiulli, PA-C  hydrOXYzine (ATARAX/VISTARIL) 25 MG tablet Take 25 mg by mouth 3 (three) times daily as needed for anxiety.   Yes Historical Provider, MD  levothyroxine (SYNTHROID, LEVOTHROID) 25 MCG tablet Take 1 tablet (25 mcg total) by mouth daily. 10/26/12  Yes Daniel J Angiulli, PA-C  lisdexamfetamine (VYVANSE) 70 MG capsule Take 1  capsule (70 mg total) by mouth every morning. 10/26/12  Yes Daniel J Angiulli, PA-C  oxyCODONE-acetaminophen (PERCOCET/ROXICET) 5-325 MG per tablet Take 1 tablet by mouth every 8 (eight) hours as needed for moderate pain or severe pain. 04/11/14   Alquan Morrish, PA-C   BP 139/80  Pulse 60  Temp(Src) 97.3 F (36.3 C) (Oral)  Resp 18  Ht 5\' 2"  (1.575 m)  Wt 124 lb (56.246 kg)  BMI 22.67 kg/m2  SpO2 97% Physical Exam  Nursing note and vitals reviewed. Constitutional: She is oriented to person, place, and time. She appears  well-developed and well-nourished. No distress.  HENT:  Head: Normocephalic and atraumatic.  Negative signs of facial trauma Negative deformities identified to the skull, negative hematomas  Eyes: Conjunctivae and EOM are normal. Pupils are equal, round, and reactive to light. Right eye exhibits no discharge. Left eye exhibits no discharge.  Neck: Normal range of motion. Neck supple. No tracheal deviation present.  Negative neck stiffness Negative nuchal rigidity  Negative cervical lymphadenopathy Discomfort upon palpation to the left trapezius muscles  Cardiovascular: Normal rate, regular rhythm and normal heart sounds.  Exam reveals no friction rub.   No murmur heard. Pulses:      Radial pulses are 2+ on the right side, and 2+ on the left side.  Pulmonary/Chest: Effort normal and breath sounds normal. No respiratory distress. She has no wheezes. She has no rales.  Musculoskeletal: She exhibits tenderness.       Left shoulder: She exhibits decreased range of motion, tenderness and bony tenderness. She exhibits no swelling, no effusion, no deformity, no laceration and no pain.       Arms: Patient refuses to move the left arm - patient keeps it close to her. Negative tenting of the clavicles. Negative sunken in appearance of the left shoulder. Discomfort upon palpation to the anterior and posterior left glenohumeral joint and proximal left humerus. Full ROM to the left wrist and left hand. Full ROM to the digits of the left digits - patient is able to make a fist. Patient is able to pronate and supinate.   Lymphadenopathy:    She has no cervical adenopathy.  Neurological: She is alert and oriented to person, place, and time. No cranial nerve deficit. She exhibits normal muscle tone. Coordination normal.  Cranial nerves III-XII grossly intact Equal grip strength bilaterally  Strength intact to MCP, PIP, DIP joints of left hand  Sensation intact with differentiation to sharp and dull touch     Skin: Skin is warm and dry. No rash noted. She is not diaphoretic. No erythema.    ED Course  Procedures (including critical care time)  8:41 AM This provider spoke with Dr. Veverly Fells, Orthopedics - discussed case, imaging, physical exam in great detail. As per orthopedic physician, reported that patient does not need to be placed in splint - reported that she can be placed in just a sling and patient is to follow-up as an outpatient with orthopedics next week.   Labs Review Labs Reviewed - No data to display  Imaging Review Dg Cervical Spine Complete  04/11/2014   CLINICAL DATA:  Fall this morning. Pain in the left neck and left shoulder extending down the left arm.  EXAM: CERVICAL SPINE  4+ VIEWS  COMPARISON:  Cervical spine MRI 04/26/2013  FINDINGS: Incomplete segmentation is again seen at C2-3. There is no listhesis. Prevertebral soft tissues are within normal limits. Severe disc space narrowing is again seen diffusely throughout the cervical spine  from C3-4 to C7-T1. Prominent degenerative endplate changes including sclerosis and osteophyte formation are seen throughout the cervical spine, most prominently at C5-6. Right-sided osseous neural foraminal narrowing appears mild at C3-4 and C4-5. There is moderate to severe diffuse left-sided osseous neural foraminal narrowing throughout the cervical spine. No cervical spine fracture is identified.  IMPRESSION: No acute osseous abnormality identified. Advanced, diffuse cervical disc degeneration.   Electronically Signed   By: Logan Bores   On: 04/11/2014 08:59   Dg Shoulder Left  04/11/2014   CLINICAL DATA:  Recent fall with left shoulder pain, initial encounter  EXAM: LEFT SHOULDER - 2+ VIEW  COMPARISON:  None.  FINDINGS: There is a fracture of the proximal left humerus involving the surgical neck with impaction at the fracture site. No dislocation is seen. The underlying bony thorax is within normal limits.  IMPRESSION: Surgical neck fracture of  the proximal left humerus.   Electronically Signed   By: Inez Catalina M.D.   On: 04/11/2014 07:30   Dg Humerus Left  04/11/2014   CLINICAL DATA:  Pain post trauma  EXAM: LEFT HUMERUS - 2+ VIEW  COMPARISON:  Left shoulder April 11, 2014  FINDINGS: Frontal and lateral views were obtained. There is a comminuted fracture of the proximal humeral metaphysis with impaction. There is avulsion of the greater tuberosity. No other fractures. No dislocation. Joint spaces appear intact.  IMPRESSION: Comminuted fracture proximal humeral metaphysis with avulsion of the greater tuberosity. No dislocation.   Electronically Signed   By: Lowella Grip M.D.   On: 04/11/2014 08:59     EKG Interpretation None      MDM   Final diagnoses:  Proximal humeral fracture, left, closed, initial encounter  Fall, initial encounter    Medications  HYDROcodone-acetaminophen (NORCO/VICODIN) 5-325 MG per tablet 1 tablet (1 tablet Oral Given 04/11/14 0827)    Filed Vitals:   04/11/14 0932 04/11/14 0828 04/11/14 1000  BP: 144/82 142/78 139/80  Pulse: 57  60  Temp: 97.3 F (36.3 C)    TempSrc: Oral    Resp: 18    Height: 5\' 2"  (1.575 m)    Weight: 124 lb (56.246 kg)    SpO2: 96% 100% 97%   Left shoulder plain film and left humerus plain film identified comminuted fracture the proximal humeral metadiaphysis with an avulsion of the greater tuberosity-no dislocations identified. Cervical plain film unremarkable - advanced cervical disc degeneration noted.  Negative focal neurological deficits noted. Equal grip strengths bilaterally. Radial pulses 2+ bilaterally. Cap refill less than 3 seconds. Sensation intact with differentiation to sharp and dull touch. This provider spoke with orthopedic surgeon, Dr. Veverly Fells, imaging reviewed by surgeon. As per physician, recommended splint to be placed and for patient to followup with him as an outpatient within one week. Doubt compartment syndrome. Negative findings of  ischemia. Patient presenting to the ED with a close left proximal humerus fracture. Patient stable, afebrile. Patient not septic appearing. Patient placed in sling as instructed from orthopedic surgeon. Discharged patient. Discharged patient with pain medications-discussed course, precautions, disposal technique. Referred patient to PCP and orthopedics. Discussed with patient importance of keeping sling on at all times. Discussed with patient the patient is to followup with orthopedics within one week. Educated patient on symptoms to watch out for regarding compartment syndrome and worsening symptoms. Discussed with patient to closely monitor symptoms and if symptoms are to worsen or change to report back to the ED - strict return instructions given.  Patient agreed to  plan of care, understood, all questions answered.   Jamse Mead, PA-C 04/11/14 1104

## 2014-04-11 NOTE — ED Provider Notes (Signed)
  Medical screening examination/treatment/procedure(s) were performed by non-physician practitioner and as supervising physician I was immediately available for consultation/collaboration.   EKG Interpretation None         Carmin Muskrat, MD 04/11/14 1559

## 2014-04-11 NOTE — ED Notes (Signed)
Pt reports that she had a fall from around 3 feet on to her left shoulder. No obvious deformity. Pulses and sensory intact.

## 2014-04-11 NOTE — Discharge Instructions (Signed)
Please call your doctor for a followup appointment within 24-48 hours. When you talk to your doctor please let them know that you were seen in the emergency department and have them acquire all of your records so that they can discuss the findings with you and formulate a treatment plan to fully care for your new and ongoing problems. Please call and set up an appointment with Dr. Lynnette Caffey, orthopedics to be seen next week Please keep the sling on at all times Please apply ice to aid in reduction of swelling Please take medications as prescribed - while on pain medications there is to be no drinking alcohol, driving, operating any heavy machinery. If extra please dispose in a proper manner. Please do not take any extra Tylenol with this medication for this can lead to Tylenol overdose and liver issues.  Please continue to monitor symptoms closely and if symptoms are to worsen or change (fever greater than 101, chills, sweating, nausea, vomiting, chest pain, shortness of breathe, difficulty breathing, weakness, numbness, tingling, worsening or changes to pain pattern, swelling to the hands, changes to skin colored, numbness, tingling, loss of sensation to the left arm, red streaks, fall, injury) please report back to the Emergency Department immediately.    Humerus Fracture, Treated with Immobilization The humerus is the large bone in your upper arm. You have a broken (fractured) humerus. These fractures are easily diagnosed with X-rays. TREATMENT  Simple fractures which will heal without disability are treated with simple immobilization. Immobilization means you will wear a cast, splint, or sling. You have a fracture which will do well with immobilization. The fracture will heal well simply by being held in a good position until it is stable enough to begin range of motion exercises. Do not take part in activities which would further injure your arm.  HOME CARE INSTRUCTIONS   Put ice on the injured  area.  Put ice in a plastic bag.  Place a towel between your skin and the bag.  Leave the ice on for 15-20 minutes, 03-04 times a day.  If you have a cast:  Do not scratch the skin under the cast using sharp or pointed objects.  Check the skin around the cast every day. You may put lotion on any red or sore areas.  Keep your cast dry and clean.  If you have a splint:  Wear the splint as directed.  Keep your splint dry and clean.  You may loosen the elastic around the splint if your fingers become numb, tingle, or turn cold or blue.  If you have a sling:  Wear the sling as directed.  Do not put pressure on any part of your cast or splint until it is fully hardened.  Your cast or splint can be protected during bathing with a plastic bag. Do not lower the cast or splint into water.  Only take over-the-counter or prescription medicines for pain, discomfort, or fever as directed by your caregiver.  Do range of motion exercises as instructed by your caregiver.  Follow up as directed by your caregiver. This is very important in order to avoid permanent injury or disability and chronic pain. SEEK IMMEDIATE MEDICAL CARE IF:   Your skin or nails in the injured arm turn blue or gray.  Your arm feels cold or numb.  You develop severe pain in the injured arm.  You are having problems with the medicines you were given. MAKE SURE YOU:   Understand these instructions.  Will watch  your condition.  Will get help right away if you are not doing well or get worse. Document Released: 10/17/2000 Document Revised: 10/03/2011 Document Reviewed: 08/25/2010 [facility] Patient Information 2015 [place], [place]. This information is not intended to replace advice given to you by your health care provider. Make sure you discuss any questions you have with your health care provider.

## 2014-04-11 NOTE — ED Notes (Signed)
Placed sling on pt's left arm

## 2015-01-04 ENCOUNTER — Emergency Department (HOSPITAL_COMMUNITY): Payer: No Typology Code available for payment source

## 2015-01-04 ENCOUNTER — Emergency Department (HOSPITAL_COMMUNITY): Payer: No Typology Code available for payment source | Admitting: Anesthesiology

## 2015-01-04 ENCOUNTER — Ambulatory Visit (HOSPITAL_COMMUNITY)
Admission: EM | Admit: 2015-01-04 | Discharge: 2015-01-05 | Disposition: A | Discharge status: Home / Self Care | Payer: No Typology Code available for payment source | Source: Home / Self Care | Attending: Emergency Medicine | Admitting: Emergency Medicine

## 2015-01-04 ENCOUNTER — Encounter (HOSPITAL_COMMUNITY): Payer: Self-pay | Admitting: *Deleted

## 2015-01-04 ENCOUNTER — Encounter (HOSPITAL_COMMUNITY)
Admission: EM | Disposition: A | Discharge status: Home / Self Care | Payer: Self-pay | Source: Home / Self Care | Attending: Emergency Medicine

## 2015-01-04 DIAGNOSIS — E039 Hypothyroidism, unspecified: Secondary | ICD-10-CM

## 2015-01-04 DIAGNOSIS — E079 Disorder of thyroid, unspecified: Secondary | ICD-10-CM | POA: Insufficient documentation

## 2015-01-04 DIAGNOSIS — Z79899 Other long term (current) drug therapy: Secondary | ICD-10-CM

## 2015-01-04 DIAGNOSIS — Y92410 Unspecified street and highway as the place of occurrence of the external cause: Secondary | ICD-10-CM | POA: Insufficient documentation

## 2015-01-04 DIAGNOSIS — F329 Major depressive disorder, single episode, unspecified: Secondary | ICD-10-CM

## 2015-01-04 DIAGNOSIS — Z9889 Other specified postprocedural states: Secondary | ICD-10-CM

## 2015-01-04 DIAGNOSIS — M5030 Other cervical disc degeneration, unspecified cervical region: Secondary | ICD-10-CM

## 2015-01-04 DIAGNOSIS — F17210 Nicotine dependence, cigarettes, uncomplicated: Secondary | ICD-10-CM

## 2015-01-04 DIAGNOSIS — Y939 Activity, unspecified: Secondary | ICD-10-CM

## 2015-01-04 DIAGNOSIS — S52379A Galeazzi's fracture of unspecified radius, initial encounter for closed fracture: Secondary | ICD-10-CM | POA: Insufficient documentation

## 2015-01-04 DIAGNOSIS — S5292XA Unspecified fracture of left forearm, initial encounter for closed fracture: Secondary | ICD-10-CM

## 2015-01-04 DIAGNOSIS — S63075A Dislocation of distal end of left ulna, initial encounter: Secondary | ICD-10-CM

## 2015-01-04 DIAGNOSIS — R079 Chest pain, unspecified: Secondary | ICD-10-CM | POA: Diagnosis not present

## 2015-01-04 DIAGNOSIS — Z8781 Personal history of (healed) traumatic fracture: Secondary | ICD-10-CM

## 2015-01-04 HISTORY — PX: ORIF ULNAR FRACTURE: SHX5417

## 2015-01-04 SURGERY — OPEN REDUCTION INTERNAL FIXATION (ORIF) ULNAR FRACTURE
Anesthesia: General | Site: Arm Lower | Laterality: Left

## 2015-01-04 MED ORDER — FENTANYL CITRATE (PF) 250 MCG/5ML IJ SOLN
INTRAMUSCULAR | Status: DC | PRN
  Administered 2015-01-04 (×2): 50 ug via INTRAVENOUS
  Administered 2015-01-04: 100 ug via INTRAVENOUS
  Administered 2015-01-04: 50 ug via INTRAVENOUS

## 2015-01-04 MED ORDER — MIDAZOLAM HCL 5 MG/5ML IJ SOLN
INTRAMUSCULAR | Status: DC | PRN
  Administered 2015-01-04 (×2): 1 mg via INTRAVENOUS

## 2015-01-04 MED ORDER — PROPOFOL 10 MG/ML IV BOLUS
INTRAVENOUS | Status: AC
  Filled 2015-01-04: qty 20

## 2015-01-04 MED ORDER — ROCURONIUM BROMIDE 100 MG/10ML IV SOLN
INTRAVENOUS | Status: DC | PRN
  Administered 2015-01-04: 10 mg via INTRAVENOUS

## 2015-01-04 MED ORDER — LACTATED RINGERS IV SOLN
INTRAVENOUS | Status: DC | PRN
  Administered 2015-01-04 (×2): via INTRAVENOUS

## 2015-01-04 MED ORDER — EPHEDRINE SULFATE 50 MG/ML IJ SOLN
INTRAMUSCULAR | Status: DC | PRN
  Administered 2015-01-04: 10 mg via INTRAVENOUS
  Administered 2015-01-05: 5 mg via INTRAVENOUS

## 2015-01-04 MED ORDER — FENTANYL CITRATE (PF) 100 MCG/2ML IJ SOLN
50.0000 ug | Freq: Once | INTRAMUSCULAR | Status: AC
  Administered 2015-01-04: 50 ug via INTRAVENOUS
  Filled 2015-01-04: qty 2

## 2015-01-04 MED ORDER — 0.9 % SODIUM CHLORIDE (POUR BTL) OPTIME
TOPICAL | Status: DC | PRN
  Administered 2015-01-04: 1000 mL

## 2015-01-04 MED ORDER — LIDOCAINE HCL (CARDIAC) 20 MG/ML IV SOLN
INTRAVENOUS | Status: DC | PRN
  Administered 2015-01-04: 50 mg via INTRAVENOUS

## 2015-01-04 MED ORDER — PROPOFOL 10 MG/ML IV BOLUS
INTRAVENOUS | Status: DC | PRN
  Administered 2015-01-04: 50 mg via INTRAVENOUS
  Administered 2015-01-04: 150 mg via INTRAVENOUS

## 2015-01-04 MED ORDER — CEFAZOLIN SODIUM-DEXTROSE 2-3 GM-% IV SOLR
INTRAVENOUS | Status: DC | PRN
  Administered 2015-01-04: 2 g via INTRAVENOUS

## 2015-01-04 MED ORDER — FENTANYL CITRATE (PF) 250 MCG/5ML IJ SOLN
INTRAMUSCULAR | Status: AC
  Filled 2015-01-04: qty 5

## 2015-01-04 MED ORDER — MIDAZOLAM HCL 2 MG/2ML IJ SOLN
INTRAMUSCULAR | Status: AC
  Filled 2015-01-04: qty 2

## 2015-01-04 MED ORDER — SUCCINYLCHOLINE CHLORIDE 20 MG/ML IJ SOLN
INTRAMUSCULAR | Status: DC | PRN
  Administered 2015-01-04: 100 mg via INTRAVENOUS

## 2015-01-04 MED ORDER — PHENYLEPHRINE HCL 10 MG/ML IJ SOLN
INTRAMUSCULAR | Status: DC | PRN
  Administered 2015-01-04: 40 ug via INTRAVENOUS

## 2015-01-04 MED ORDER — OXYCODONE-ACETAMINOPHEN 5-325 MG PO TABS
2.0000 | ORAL_TABLET | Freq: Once | ORAL | Status: AC
  Administered 2015-01-04: 2 via ORAL
  Filled 2015-01-04: qty 2

## 2015-01-04 SURGICAL SUPPLY — 53 items
BANDAGE COBAN STERILE 2 (GAUZE/BANDAGES/DRESSINGS) IMPLANT
BIT DRILL 170X2.5X (BIT) ×1 IMPLANT
BIT DRL 170X2.5X (BIT) ×1
BLADE MINI RND TIP GREEN BEAV (BLADE) IMPLANT
BLADE SURG 15 STRL LF DISP TIS (BLADE) ×1 IMPLANT
BLADE SURG 15 STRL SS (BLADE) ×2
BNDG COHESIVE 4X5 TAN STRL (GAUZE/BANDAGES/DRESSINGS) ×3 IMPLANT
BNDG COHESIVE 6X5 TAN STRL LF (GAUZE/BANDAGES/DRESSINGS) ×3 IMPLANT
BNDG ESMARK 4X9 LF (GAUZE/BANDAGES/DRESSINGS) ×3 IMPLANT
BNDG GAUZE ELAST 4 BULKY (GAUZE/BANDAGES/DRESSINGS) ×3 IMPLANT
BRUSH SCRUB EZ PLAIN DRY (MISCELLANEOUS) IMPLANT
CANISTER SUCTION 2500CC (MISCELLANEOUS) ×3 IMPLANT
CHLORAPREP W/TINT 26ML (MISCELLANEOUS) ×3 IMPLANT
CORDS BIPOLAR (ELECTRODE) ×3 IMPLANT
COVER TABLE BACK 60X90 (DRAPES) ×3 IMPLANT
CUFF TOURNIQUET SINGLE 18IN (TOURNIQUET CUFF) IMPLANT
CUFF TOURNIQUET SINGLE 24IN (TOURNIQUET CUFF) IMPLANT
DRAPE C-ARM 42X72 X-RAY (DRAPES) ×6 IMPLANT
DRAPE SURG 17X23 STRL (DRAPES) ×3 IMPLANT
DRILL 2.5 (BIT) ×2
DRILL 2.7 (BIT) ×3 IMPLANT
DRSG ADAPTIC 3X8 NADH LF (GAUZE/BANDAGES/DRESSINGS) ×3 IMPLANT
DRSG EMULSION OIL 3X3 NADH (GAUZE/BANDAGES/DRESSINGS) IMPLANT
GAUZE SPONGE 4X4 12PLY STRL (GAUZE/BANDAGES/DRESSINGS) ×3 IMPLANT
GLOVE BIO SURGEON STRL SZ7.5 (GLOVE) ×3 IMPLANT
GLOVE BIOGEL PI IND STRL 8 (GLOVE) ×1 IMPLANT
GLOVE BIOGEL PI INDICATOR 8 (GLOVE) ×2
GOWN STRL REUS W/ TWL XL LVL3 (GOWN DISPOSABLE) ×1 IMPLANT
GOWN STRL REUS W/TWL XL LVL3 (GOWN DISPOSABLE) ×2
KIT BASIN OR (CUSTOM PROCEDURE TRAY) ×3 IMPLANT
NEEDLE HYPO 25X1 1.5 SAFETY (NEEDLE) ×3 IMPLANT
NS IRRIG 1000ML POUR BTL (IV SOLUTION) ×3 IMPLANT
PACK ORTHO EXTREMITY (CUSTOM PROCEDURE TRAY) ×3 IMPLANT
PAD CAST 4YDX4 CTTN HI CHSV (CAST SUPPLIES) ×1 IMPLANT
PADDING CAST ABS 4INX4YD NS (CAST SUPPLIES)
PADDING CAST ABS COTTON 4X4 ST (CAST SUPPLIES) IMPLANT
PADDING CAST COTTON 4X4 STRL (CAST SUPPLIES) ×2
PENCIL BUTTON HOLSTER BLD 10FT (ELECTRODE) ×3 IMPLANT
PLATE LOCK COMP 7H FOOT (Plate) ×3 IMPLANT
RUBBERBAND STERILE (MISCELLANEOUS) IMPLANT
SCREW CORTICAL 3.5MM  12MM (Screw) ×2 IMPLANT
SCREW CORTICAL 3.5MM 12MM (Screw) ×1 IMPLANT
SCREW CORTICAL 3.5MM 14MM (Screw) ×6 IMPLANT
SCREW LOCK CORT STAR 3.5X10 (Screw) ×12 IMPLANT
SLING ARM IMMOBILIZER MED (SOFTGOODS) ×3 IMPLANT
SPLINT FIBERGLASS 3X35 (CAST SUPPLIES) ×3 IMPLANT
SPONGE GAUZE 4X4 12PLY STER LF (GAUZE/BANDAGES/DRESSINGS) ×3 IMPLANT
SUT VIC AB 2-0 CT3 27 (SUTURE) ×3 IMPLANT
SUT VICRYL 4-0 PS2 18IN ABS (SUTURE) IMPLANT
SUT VICRYL RAPIDE 4/0 PS 2 (SUTURE) ×3 IMPLANT
SYRINGE 10CC LL (SYRINGE) IMPLANT
TOWEL OR 17X24 6PK STRL BLUE (TOWEL DISPOSABLE) ×3 IMPLANT
UNDERPAD 30X30 INCONTINENT (UNDERPADS AND DIAPERS) ×3 IMPLANT

## 2015-01-04 NOTE — Discharge Instructions (Signed)
Discharge Instructions   You have a dressing with a plaster splint incorporated in it. Move your fingers as much as possible, making a full fist and fully opening the fist. Elevate your hand to reduce pain & swelling of the digits.  Ice over the operative site may be helpful to reduce pain & swelling.  DO NOT USE HEAT. Pain medicine has been prescribed for you.  Use your medicine as needed over the first 48 hours, and then you can begin to taper your use.  You may use Tylenol in place of your prescribed pain medication, but not IN ADDITION to it. Leave the dressing in place until you return to our office.  You may shower, but keep the bandage clean & dry.  You may drive a car when you are off of prescription pain medications and can safely control your vehicle with both hands. Our office will call you to arrange follow-up   Please call [phone] during normal business hours or [phone] after hours for any problems. Including the following:  - excessive redness of the incisions - drainage for more than 4 days - fever of more than 101.5 F  *Please note that pain medications will not be refilled after hours or on weekends.

## 2015-01-04 NOTE — ED Notes (Signed)
C-collar applied on arrival.
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
## 2015-01-04 NOTE — ED Notes (Signed)
PA at bedside.

## 2015-01-04 NOTE — Anesthesia Preprocedure Evaluation (Addendum)
Anesthesia Evaluation  Patient identified by MRN, date of birth, ID band Patient awake    Reviewed: Allergy & Precautions, NPO status , Patient's Chart, lab work & pertinent test results  Airway Mallampati: II  TM Distance: >3 FB Neck ROM: Full    Dental  (+) Edentulous Upper, Edentulous Lower   Pulmonary Current Smoker,  breath sounds clear to auscultation        Cardiovascular Rhythm:Regular Rate:Normal     Neuro/Psych    GI/Hepatic   Endo/Other  Hypothyroidism   Renal/GU      Musculoskeletal  (+) Arthritis -,   Abdominal   Peds  Hematology   Anesthesia Other Findings   Reproductive/Obstetrics                            Anesthesia Physical Anesthesia Plan  ASA: II and emergent  Anesthesia Plan: General   Post-op Pain Management:    Induction: Intravenous  Airway Management Planned: Oral ETT  Additional Equipment:   Intra-op Plan:   Post-operative Plan: Extubation in OR  Informed Consent: I have reviewed the patients History and Physical, chart, labs and discussed the procedure including the risks, benefits and alternatives for the proposed anesthesia with the patient or authorized representative who has indicated his/her understanding and acceptance.   Dental advisory given  Plan Discussed with: CRNA, Anesthesiologist and Surgeon  Anesthesia Plan Comments:        Anesthesia Quick Evaluation

## 2015-01-04 NOTE — Anesthesia Procedure Notes (Signed)
Procedure Name: Intubation Date/Time: 01/04/2015 11:20 PM Performed by: Maude Leriche D Pre-anesthesia Checklist: Patient identified, Emergency Drugs available, Suction available and Patient being monitored Patient Re-evaluated:Patient Re-evaluated prior to inductionOxygen Delivery Method: Circle system utilized Preoxygenation: Pre-oxygenation with 100% oxygen Intubation Type: IV induction, Rapid sequence and Cricoid Pressure applied Laryngoscope Size: Miller and 2 Grade View: Grade I Tube type: Oral Tube size: 7.5 mm Number of attempts: 1 Airway Equipment and Method: Stylet Placement Confirmation: ETT inserted through vocal cords under direct vision,  positive ETCO2 and breath sounds checked- equal and bilateral Secured at: 21 cm Tube secured with: Tape Dental Injury: Teeth and Oropharynx as per pre-operative assessment
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
## 2015-01-04 NOTE — ED Notes (Signed)
C collar removed by PA Sanders.

## 2015-01-05 ENCOUNTER — Inpatient Hospital Stay (HOSPITAL_COMMUNITY)
Admission: EM | Admit: 2015-01-05 | Discharge: 2015-01-07 | DRG: 511 | Disposition: A | Discharge status: Home Health | Payer: No Typology Code available for payment source | Attending: Internal Medicine | Admitting: Internal Medicine

## 2015-01-05 ENCOUNTER — Emergency Department (HOSPITAL_COMMUNITY): Payer: No Typology Code available for payment source

## 2015-01-05 ENCOUNTER — Other Ambulatory Visit (HOSPITAL_COMMUNITY): Payer: Self-pay

## 2015-01-05 ENCOUNTER — Encounter (HOSPITAL_COMMUNITY): Payer: Self-pay | Admitting: Orthopedic Surgery

## 2015-01-05 DIAGNOSIS — R079 Chest pain, unspecified: Secondary | ICD-10-CM

## 2015-01-05 DIAGNOSIS — R9431 Abnormal electrocardiogram [ECG] [EKG]: Secondary | ICD-10-CM | POA: Diagnosis present

## 2015-01-05 DIAGNOSIS — R609 Edema, unspecified: Secondary | ICD-10-CM

## 2015-01-05 DIAGNOSIS — E871 Hypo-osmolality and hyponatremia: Secondary | ICD-10-CM | POA: Diagnosis present

## 2015-01-05 DIAGNOSIS — R0789 Other chest pain: Secondary | ICD-10-CM | POA: Diagnosis present

## 2015-01-05 DIAGNOSIS — D62 Acute posthemorrhagic anemia: Secondary | ICD-10-CM

## 2015-01-05 DIAGNOSIS — Z9889 Other specified postprocedural states: Secondary | ICD-10-CM

## 2015-01-05 DIAGNOSIS — Z79899 Other long term (current) drug therapy: Secondary | ICD-10-CM

## 2015-01-05 DIAGNOSIS — F329 Major depressive disorder, single episode, unspecified: Secondary | ICD-10-CM | POA: Diagnosis present

## 2015-01-05 DIAGNOSIS — S5292XA Unspecified fracture of left forearm, initial encounter for closed fracture: Principal | ICD-10-CM | POA: Diagnosis present

## 2015-01-05 DIAGNOSIS — M7989 Other specified soft tissue disorders: Secondary | ICD-10-CM

## 2015-01-05 DIAGNOSIS — E039 Hypothyroidism, unspecified: Secondary | ICD-10-CM | POA: Diagnosis present

## 2015-01-05 DIAGNOSIS — Y838 Other surgical procedures as the cause of abnormal reaction of the patient, or of later complication, without mention of misadventure at the time of the procedure: Secondary | ICD-10-CM | POA: Diagnosis present

## 2015-01-05 DIAGNOSIS — M549 Dorsalgia, unspecified: Secondary | ICD-10-CM | POA: Diagnosis present

## 2015-01-05 DIAGNOSIS — R1032 Left lower quadrant pain: Secondary | ICD-10-CM

## 2015-01-05 DIAGNOSIS — M96830 Postprocedural hemorrhage and hematoma of a musculoskeletal structure following a musculoskeletal system procedure: Secondary | ICD-10-CM | POA: Diagnosis not present

## 2015-01-05 DIAGNOSIS — D649 Anemia, unspecified: Secondary | ICD-10-CM | POA: Diagnosis present

## 2015-01-05 DIAGNOSIS — G5602 Carpal tunnel syndrome, left upper limb: Secondary | ICD-10-CM | POA: Diagnosis present

## 2015-01-05 DIAGNOSIS — R1013 Epigastric pain: Secondary | ICD-10-CM

## 2015-01-05 DIAGNOSIS — E876 Hypokalemia: Secondary | ICD-10-CM | POA: Diagnosis present

## 2015-01-05 DIAGNOSIS — R109 Unspecified abdominal pain: Secondary | ICD-10-CM | POA: Diagnosis present

## 2015-01-05 DIAGNOSIS — E222 Syndrome of inappropriate secretion of antidiuretic hormone: Secondary | ICD-10-CM | POA: Diagnosis present

## 2015-01-05 DIAGNOSIS — M1990 Unspecified osteoarthritis, unspecified site: Secondary | ICD-10-CM | POA: Diagnosis present

## 2015-01-05 DIAGNOSIS — R52 Pain, unspecified: Secondary | ICD-10-CM

## 2015-01-05 DIAGNOSIS — G8929 Other chronic pain: Secondary | ICD-10-CM | POA: Diagnosis present

## 2015-01-05 DIAGNOSIS — F17210 Nicotine dependence, cigarettes, uncomplicated: Secondary | ICD-10-CM | POA: Diagnosis present

## 2015-01-05 DIAGNOSIS — IMO0002 Reserved for concepts with insufficient information to code with codable children: Secondary | ICD-10-CM | POA: Diagnosis present

## 2015-01-05 DIAGNOSIS — R072 Precordial pain: Secondary | ICD-10-CM

## 2015-01-05 DIAGNOSIS — Z8781 Personal history of (healed) traumatic fracture: Secondary | ICD-10-CM

## 2015-01-05 LAB — POCT I-STAT 4, (NA,K, GLUC, HGB,HCT)
Glucose, Bld: 90 mg/dL (ref 65–99)
HEMATOCRIT: 38 % (ref 36.0–46.0)
Hemoglobin: 12.9 g/dL (ref 12.0–15.0)
Potassium: 4.3 mmol/L (ref 3.5–5.1)
Sodium: 132 mmol/L — ABNORMAL LOW (ref 135–145)

## 2015-01-05 LAB — I-STAT TROPONIN, ED: Troponin i, poc: 0 ng/mL (ref 0.00–0.08)

## 2015-01-05 LAB — BASIC METABOLIC PANEL
Anion gap: 12 (ref 5–15)
BUN: 7 mg/dL (ref 6–20)
CO2: 22 mmol/L (ref 22–32)
Calcium: 8.7 mg/dL — ABNORMAL LOW (ref 8.9–10.3)
Chloride: 93 mmol/L — ABNORMAL LOW (ref 101–111)
Creatinine, Ser: 0.66 mg/dL (ref 0.44–1.00)
GFR calc Af Amer: >60 mL/min (ref >60)
GFR calc non Af Amer: >60 mL/min (ref >60)
Glucose, Bld: 151 mg/dL — ABNORMAL HIGH (ref 65–99)
Potassium: 3.6 mmol/L (ref 3.5–5.1)
Sodium: 127 mmol/L — ABNORMAL LOW (ref 135–145)

## 2015-01-05 LAB — CBC WITH DIFFERENTIAL/PLATELET
Basophils Absolute: 0 10*3/uL (ref 0.0–0.1)
Basophils Relative: 0 % (ref 0–1)
Eosinophils Absolute: 0.1 10*3/uL (ref 0.0–0.7)
Eosinophils Relative: 0 % (ref 0–5)
HCT: 27.1 % — ABNORMAL LOW (ref 36.0–46.0)
Hemoglobin: 9.6 g/dL — ABNORMAL LOW (ref 12.0–15.0)
Lymphocytes Relative: 10 % — ABNORMAL LOW (ref 12–46)
Lymphs Abs: 1.3 10*3/uL (ref 0.7–4.0)
MCH: 31 pg (ref 26.0–34.0)
MCHC: 35.4 g/dL (ref 30.0–36.0)
MCV: 87.4 fL (ref 78.0–100.0)
Monocytes Absolute: 0.8 10*3/uL (ref 0.1–1.0)
Monocytes Relative: 6 % (ref 3–12)
Neutro Abs: 11.1 10*3/uL — ABNORMAL HIGH (ref 1.7–7.7)
Neutrophils Relative %: 84 % — ABNORMAL HIGH (ref 43–77)
Platelets: 354 10*3/uL (ref 150–400)
RBC: 3.1 MIL/uL — ABNORMAL LOW (ref 3.87–5.11)
RDW: 12.2 % (ref 11.5–15.5)
WBC: 13.2 10*3/uL — ABNORMAL HIGH (ref 4.0–10.5)

## 2015-01-05 LAB — TROPONIN I: Troponin I: <0.03 ng/mL (ref <0.031)

## 2015-01-05 MED ORDER — HYDROMORPHONE HCL 1 MG/ML IJ SOLN
INTRAMUSCULAR | Status: AC
  Filled 2015-01-05: qty 1

## 2015-01-05 MED ORDER — BUPIVACAINE-EPINEPHRINE (PF) 0.5% -1:200000 IJ SOLN
INTRAMUSCULAR | Status: AC
  Filled 2015-01-05: qty 30

## 2015-01-05 MED ORDER — ONDANSETRON HCL 4 MG/2ML IJ SOLN
INTRAMUSCULAR | Status: DC | PRN
  Administered 2015-01-05: 4 mg via INTRAVENOUS

## 2015-01-05 MED ORDER — OXYCODONE-ACETAMINOPHEN 5-325 MG PO TABS: 1.0000 | ORAL_TABLET | Freq: Four times a day (QID) | ORAL | Status: DC | PRN

## 2015-01-05 MED ORDER — OXYCODONE HCL 5 MG PO TABS
ORAL_TABLET | ORAL | Status: AC
  Filled 2015-01-05: qty 1

## 2015-01-05 MED ORDER — GLYCOPYRROLATE 0.2 MG/ML IJ SOLN
INTRAMUSCULAR | Status: DC | PRN
  Administered 2015-01-05: 0.4 mg via INTRAVENOUS

## 2015-01-05 MED ORDER — BUPIVACAINE-EPINEPHRINE (PF) 0.5% -1:200000 IJ SOLN
INTRAMUSCULAR | Status: DC | PRN
Start: 2015-01-05 — End: 2015-01-05
  Administered 2015-01-05: 10 mL via PERINEURAL

## 2015-01-05 MED ORDER — ASPIRIN 81 MG PO CHEW
324.0000 mg | CHEWABLE_TABLET | Freq: Once | ORAL | Status: AC
  Administered 2015-01-05: 324 mg via ORAL
  Filled 2015-01-05: qty 4

## 2015-01-05 MED ORDER — ONDANSETRON HCL 4 MG/2ML IJ SOLN: 4.0000 mg | Freq: Once | INTRAMUSCULAR | Status: DC | PRN

## 2015-01-05 MED ORDER — HYDROMORPHONE HCL 1 MG/ML IJ SOLN
0.2500 mg | INTRAMUSCULAR | Status: DC | PRN
  Administered 2015-01-05 (×4): 0.5 mg via INTRAVENOUS

## 2015-01-05 MED ORDER — OXYCODONE-ACETAMINOPHEN 5-325 MG PO TABS
2.0000 | ORAL_TABLET | ORAL | Status: DC | PRN
  Administered 2015-01-05: 2 via ORAL

## 2015-01-05 MED ORDER — OXYCODONE-ACETAMINOPHEN 5-325 MG PO TABS
ORAL_TABLET | ORAL | Status: AC
  Filled 2015-01-05: qty 2

## 2015-01-05 MED ORDER — NEOSTIGMINE METHYLSULFATE 10 MG/10ML IV SOLN
INTRAVENOUS | Status: DC | PRN
  Administered 2015-01-05: 2 mg via INTRAVENOUS

## 2015-01-05 NOTE — ED Provider Notes (Signed)
Handed EKG from pt currently in waiting room. Ischemic changes. Notifed nursing that needs next available room.   Virgel Manifold, MD 01/05/15 2055
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
## 2015-01-05 NOTE — Anesthesia Postprocedure Evaluation (Signed)
  Anesthesia Post-op Note  Patient: Amy Hamilton  Procedure(s) Performed: Procedure(s): OPEN REDUCTION INTERNAL FIXATION (ORIF) ULNAR FRACTURE (Left)  Patient Location: PACU  Anesthesia Type:General  Level of Consciousness: awake, alert  and oriented  Airway and Oxygen Therapy: Patient Spontanous Breathing and Patient connected to nasal cannula oxygen  Post-op Pain: mild  Post-op Assessment: Post-op Vital signs reviewed, Patient's Cardiovascular Status Stable, Respiratory Function Stable, Patent Airway and Pain level controlled              Post-op Vital Signs: stable  Last Vitals:  Filed Vitals:   01/05/15 0559  BP: 120/77  Pulse: 64  Temp: 36.9 C  Resp: 16    Complications: No apparent anesthesia complications

## 2015-01-05 NOTE — ED Notes (Signed)
Pt. reports left chest pain onset this evening with mild SOB , no nausea or diaphoresis , pt. also reported left arm pain with hand swelling unrelieved by prescription pain medication , pt. had a surgery yesterday ( left ulnar fracture) by Dr. Grandville Silos .

## 2015-01-05 NOTE — Transfer of Care (Signed)
Immediate Anesthesia Transfer of Care Note  Patient: Amy Hamilton  Procedure(s) Performed: Procedure(s): OPEN REDUCTION INTERNAL FIXATION (ORIF) ULNAR FRACTURE (Left)  Patient Location: PACU  Anesthesia Type:General  Level of Consciousness: sedated  Airway & Oxygen Therapy: Patient Spontanous Breathing and Patient connected to face mask oxygen  Post-op Assessment: Report given to RN and Post -op Vital signs reviewed and stable  Post vital signs: Reviewed and stable  Last Vitals:  Filed Vitals:   01/04/15 1845  BP: 163/93  Pulse: 63  Temp:   Resp:     Complications: No apparent anesthesia complications

## 2015-01-06 ENCOUNTER — Encounter (HOSPITAL_COMMUNITY): Payer: Self-pay

## 2015-01-06 ENCOUNTER — Inpatient Hospital Stay (HOSPITAL_COMMUNITY): Payer: No Typology Code available for payment source

## 2015-01-06 ENCOUNTER — Inpatient Hospital Stay (HOSPITAL_COMMUNITY): Payer: No Typology Code available for payment source | Admitting: Anesthesiology

## 2015-01-06 ENCOUNTER — Emergency Department (HOSPITAL_COMMUNITY): Payer: No Typology Code available for payment source

## 2015-01-06 ENCOUNTER — Encounter (HOSPITAL_COMMUNITY)
Admission: EM | Disposition: A | Discharge status: Home Health | Payer: Self-pay | Source: Home / Self Care | Attending: Internal Medicine

## 2015-01-06 DIAGNOSIS — R609 Edema, unspecified: Secondary | ICD-10-CM

## 2015-01-06 DIAGNOSIS — R079 Chest pain, unspecified: Secondary | ICD-10-CM

## 2015-01-06 DIAGNOSIS — G5602 Carpal tunnel syndrome, left upper limb: Secondary | ICD-10-CM | POA: Diagnosis present

## 2015-01-06 DIAGNOSIS — R52 Pain, unspecified: Secondary | ICD-10-CM | POA: Diagnosis not present

## 2015-01-06 DIAGNOSIS — M549 Dorsalgia, unspecified: Secondary | ICD-10-CM | POA: Diagnosis present

## 2015-01-06 DIAGNOSIS — G8929 Other chronic pain: Secondary | ICD-10-CM | POA: Diagnosis present

## 2015-01-06 DIAGNOSIS — E871 Hypo-osmolality and hyponatremia: Secondary | ICD-10-CM | POA: Diagnosis present

## 2015-01-06 DIAGNOSIS — R072 Precordial pain: Secondary | ICD-10-CM

## 2015-01-06 DIAGNOSIS — D62 Acute posthemorrhagic anemia: Secondary | ICD-10-CM | POA: Diagnosis not present

## 2015-01-06 DIAGNOSIS — M1990 Unspecified osteoarthritis, unspecified site: Secondary | ICD-10-CM | POA: Diagnosis present

## 2015-01-06 DIAGNOSIS — S5292XA Unspecified fracture of left forearm, initial encounter for closed fracture: Secondary | ICD-10-CM | POA: Diagnosis present

## 2015-01-06 DIAGNOSIS — F329 Major depressive disorder, single episode, unspecified: Secondary | ICD-10-CM | POA: Diagnosis present

## 2015-01-06 DIAGNOSIS — M96830 Postprocedural hemorrhage and hematoma of a musculoskeletal structure following a musculoskeletal system procedure: Secondary | ICD-10-CM | POA: Diagnosis not present

## 2015-01-06 DIAGNOSIS — R9431 Abnormal electrocardiogram [ECG] [EKG]: Secondary | ICD-10-CM | POA: Diagnosis present

## 2015-01-06 DIAGNOSIS — R0789 Other chest pain: Secondary | ICD-10-CM | POA: Diagnosis not present

## 2015-01-06 DIAGNOSIS — M7989 Other specified soft tissue disorders: Secondary | ICD-10-CM | POA: Diagnosis not present

## 2015-01-06 DIAGNOSIS — Y838 Other surgical procedures as the cause of abnormal reaction of the patient, or of later complication, without mention of misadventure at the time of the procedure: Secondary | ICD-10-CM | POA: Diagnosis present

## 2015-01-06 DIAGNOSIS — R109 Unspecified abdominal pain: Secondary | ICD-10-CM | POA: Diagnosis present

## 2015-01-06 DIAGNOSIS — Z8781 Personal history of (healed) traumatic fracture: Secondary | ICD-10-CM

## 2015-01-06 DIAGNOSIS — E039 Hypothyroidism, unspecified: Secondary | ICD-10-CM | POA: Diagnosis present

## 2015-01-06 DIAGNOSIS — Z967 Presence of other bone and tendon implants: Secondary | ICD-10-CM

## 2015-01-06 DIAGNOSIS — Z79899 Other long term (current) drug therapy: Secondary | ICD-10-CM | POA: Diagnosis not present

## 2015-01-06 DIAGNOSIS — Z9889 Other specified postprocedural states: Secondary | ICD-10-CM

## 2015-01-06 DIAGNOSIS — R100 Acute abdomen: Secondary | ICD-10-CM

## 2015-01-06 DIAGNOSIS — D649 Anemia, unspecified: Secondary | ICD-10-CM | POA: Diagnosis not present

## 2015-01-06 DIAGNOSIS — E876 Hypokalemia: Secondary | ICD-10-CM | POA: Diagnosis present

## 2015-01-06 DIAGNOSIS — S5292XD Unspecified fracture of left forearm, subsequent encounter for closed fracture with routine healing: Secondary | ICD-10-CM

## 2015-01-06 DIAGNOSIS — F17210 Nicotine dependence, cigarettes, uncomplicated: Secondary | ICD-10-CM | POA: Diagnosis present

## 2015-01-06 DIAGNOSIS — E222 Syndrome of inappropriate secretion of antidiuretic hormone: Secondary | ICD-10-CM | POA: Diagnosis present

## 2015-01-06 HISTORY — DX: Hypo-osmolality and hyponatremia: E87.1

## 2015-01-06 HISTORY — PX: APPLICATION OF WOUND VAC: SHX5189

## 2015-01-06 HISTORY — PX: CARPAL TUNNEL RELEASE: SHX101

## 2015-01-06 HISTORY — DX: Unspecified fracture of left forearm, initial encounter for closed fracture: S52.92XA

## 2015-01-06 HISTORY — DX: Personal history of (healed) traumatic fracture: Z87.81

## 2015-01-06 HISTORY — DX: Other specified postprocedural states: Z98.890

## 2015-01-06 HISTORY — DX: Unspecified abdominal pain: R10.9

## 2015-01-06 HISTORY — DX: Abnormal electrocardiogram (ECG) (EKG): R94.31

## 2015-01-06 HISTORY — PX: I&D EXTREMITY: SHX5045

## 2015-01-06 LAB — CBC WITH DIFFERENTIAL/PLATELET
BASOS PCT: 0 % (ref 0–1)
Basophils Absolute: 0 10*3/uL (ref 0.0–0.1)
Eosinophils Absolute: 0 10*3/uL (ref 0.0–0.7)
Eosinophils Relative: 0 % (ref 0–5)
HEMATOCRIT: 27.5 % — AB (ref 36.0–46.0)
HEMOGLOBIN: 9.7 g/dL — AB (ref 12.0–15.0)
LYMPHS PCT: 17 % (ref 12–46)
Lymphs Abs: 2 10*3/uL (ref 0.7–4.0)
MCH: 30.4 pg (ref 26.0–34.0)
MCHC: 35.3 g/dL (ref 30.0–36.0)
MCV: 86.2 fL (ref 78.0–100.0)
MONOS PCT: 10 % (ref 3–12)
Monocytes Absolute: 1.1 10*3/uL — ABNORMAL HIGH (ref 0.1–1.0)
NEUTROS ABS: 8.5 10*3/uL — AB (ref 1.7–7.7)
Neutrophils Relative %: 73 % (ref 43–77)
Platelets: 354 10*3/uL (ref 150–400)
RBC: 3.19 MIL/uL — ABNORMAL LOW (ref 3.87–5.11)
RDW: 12 % (ref 11.5–15.5)
WBC: 11.7 10*3/uL — ABNORMAL HIGH (ref 4.0–10.5)

## 2015-01-06 LAB — COMPREHENSIVE METABOLIC PANEL
ALT: 14 U/L (ref 14–54)
ANION GAP: 10 (ref 5–15)
AST: 25 U/L (ref 15–41)
Albumin: 3.1 g/dL — ABNORMAL LOW (ref 3.5–5.0)
Alkaline Phosphatase: 67 U/L (ref 38–126)
BUN: 7 mg/dL (ref 6–20)
CALCIUM: 8.4 mg/dL — AB (ref 8.9–10.3)
CO2: 24 mmol/L (ref 22–32)
Chloride: 94 mmol/L — ABNORMAL LOW (ref 101–111)
Creatinine, Ser: 0.52 mg/dL (ref 0.44–1.00)
GFR calc non Af Amer: >60 mL/min (ref >60)
Glucose, Bld: 117 mg/dL — ABNORMAL HIGH (ref 65–99)
POTASSIUM: 3.5 mmol/L (ref 3.5–5.1)
SODIUM: 128 mmol/L — AB (ref 135–145)
Total Bilirubin: 0.7 mg/dL (ref 0.3–1.2)
Total Protein: 6.1 g/dL — ABNORMAL LOW (ref 6.5–8.1)

## 2015-01-06 LAB — BASIC METABOLIC PANEL
Anion gap: 7 (ref 5–15)
BUN: 6 mg/dL (ref 6–20)
CO2: 27 mmol/L (ref 22–32)
Calcium: 8.4 mg/dL — ABNORMAL LOW (ref 8.9–10.3)
Chloride: 96 mmol/L — ABNORMAL LOW (ref 101–111)
Creatinine, Ser: 0.53 mg/dL (ref 0.44–1.00)
GFR calc Af Amer: >60 mL/min (ref >60)
GFR calc non Af Amer: >60 mL/min (ref >60)
Glucose, Bld: 138 mg/dL — ABNORMAL HIGH (ref 65–99)
Potassium: 3.7 mmol/L (ref 3.5–5.1)
Sodium: 130 mmol/L — ABNORMAL LOW (ref 135–145)

## 2015-01-06 LAB — TROPONIN I
Troponin I: <0.03 ng/mL (ref <0.031)
Troponin I: <0.03 ng/mL (ref <0.031)

## 2015-01-06 LAB — TYPE AND SCREEN
ABO/RH(D): O POS
Antibody Screen: NEGATIVE

## 2015-01-06 LAB — LIPASE, BLOOD: Lipase: 12 U/L — ABNORMAL LOW (ref 22–51)

## 2015-01-06 LAB — MRSA PCR SCREENING: MRSA by PCR: NEGATIVE

## 2015-01-06 LAB — CK TOTAL AND CKMB (NOT AT ARMC)
CK, MB: 4.6 ng/mL (ref 0.5–5.0)
Relative Index: 1.9 (ref 0.0–2.5)
Total CK: 248 U/L — ABNORMAL HIGH (ref 38–234)

## 2015-01-06 LAB — OSMOLALITY: OSMOLALITY: 272 mosm/kg — AB (ref 275–300)

## 2015-01-06 LAB — LACTIC ACID, PLASMA: Lactic Acid, Venous: 0.9 mmol/L (ref 0.5–2.0)

## 2015-01-06 SURGERY — CARPAL TUNNEL RELEASE
Anesthesia: General | Site: Arm Lower | Laterality: Left

## 2015-01-06 MED ORDER — MIDAZOLAM HCL 2 MG/2ML IJ SOLN
INTRAMUSCULAR | Status: AC
  Filled 2015-01-06: qty 2

## 2015-01-06 MED ORDER — PNEUMOCOCCAL VAC POLYVALENT 25 MCG/0.5ML IJ INJ
0.5000 mL | INJECTION | INTRAMUSCULAR | Status: AC
  Administered 2015-01-07: 0.5 mL via INTRAMUSCULAR
  Filled 2015-01-06: qty 0.5

## 2015-01-06 MED ORDER — SODIUM CHLORIDE 0.9 % IV SOLN
INTRAVENOUS | Status: DC
  Administered 2015-01-06: 50 mL/h via INTRAVENOUS
  Administered 2015-01-07: 05:00:00 via INTRAVENOUS

## 2015-01-06 MED ORDER — ENOXAPARIN SODIUM 40 MG/0.4ML ~~LOC~~ SOLN
40.0000 mg | Freq: Every day | SUBCUTANEOUS | Status: DC
Start: 2015-01-06 — End: 2015-01-06
  Administered 2015-01-06: 40 mg via SUBCUTANEOUS
  Filled 2015-01-06: qty 0.4

## 2015-01-06 MED ORDER — PHENYLEPHRINE HCL 10 MG/ML IJ SOLN
INTRAMUSCULAR | Status: DC | PRN
  Administered 2015-01-06: 160 ug via INTRAVENOUS
  Administered 2015-01-06 (×3): 120 ug via INTRAVENOUS
  Administered 2015-01-06: 160 ug via INTRAVENOUS

## 2015-01-06 MED ORDER — SODIUM CHLORIDE 0.9 % IR SOLN
Status: DC | PRN
  Administered 2015-01-06: 1000 mL

## 2015-01-06 MED ORDER — ACETAMINOPHEN 325 MG PO TABS: 650.0000 mg | ORAL_TABLET | ORAL | Status: DC | PRN

## 2015-01-06 MED ORDER — OXYCODONE-ACETAMINOPHEN 5-325 MG PO TABS
1.0000 | ORAL_TABLET | ORAL | Status: DC | PRN
  Administered 2015-01-06 – 2015-01-07 (×7): 2 via ORAL
  Filled 2015-01-06 (×7): qty 2

## 2015-01-06 MED ORDER — ONDANSETRON HCL 4 MG/2ML IJ SOLN
4.0000 mg | Freq: Four times a day (QID) | INTRAMUSCULAR | Status: DC | PRN
  Administered 2015-01-06 (×2): 4 mg via INTRAVENOUS
  Filled 2015-01-06: qty 2

## 2015-01-06 MED ORDER — METOPROLOL TARTRATE 12.5 MG HALF TABLET
12.5000 mg | ORAL_TABLET | Freq: Two times a day (BID) | ORAL | Status: DC
  Administered 2015-01-06 – 2015-01-07 (×2): 12.5 mg via ORAL
  Filled 2015-01-06 (×3): qty 1

## 2015-01-06 MED ORDER — MORPHINE SULFATE 2 MG/ML IJ SOLN
2.0000 mg | INTRAMUSCULAR | Status: DC | PRN
Start: 2015-01-06 — End: 2015-01-07
  Administered 2015-01-06: 2 mg via INTRAVENOUS
  Filled 2015-01-06: qty 1

## 2015-01-06 MED ORDER — LACTATED RINGERS IV SOLN
INTRAVENOUS | Status: DC
  Administered 2015-01-06 (×2): via INTRAVENOUS

## 2015-01-06 MED ORDER — PROMETHAZINE HCL 25 MG/ML IJ SOLN: 6.2500 mg | INTRAMUSCULAR | Status: DC | PRN

## 2015-01-06 MED ORDER — OXYCODONE HCL 5 MG/5ML PO SOLN: 5.0000 mg | Freq: Once | ORAL | Status: DC | PRN

## 2015-01-06 MED ORDER — MORPHINE SULFATE 2 MG/ML IJ SOLN
2.0000 mg | INTRAMUSCULAR | Status: DC | PRN
  Administered 2015-01-06 (×2): 2 mg via INTRAVENOUS
  Filled 2015-01-06 (×2): qty 1

## 2015-01-06 MED ORDER — LIDOCAINE HCL (CARDIAC) 20 MG/ML IV SOLN
INTRAVENOUS | Status: DC | PRN
  Administered 2015-01-06: 50 mg via INTRAVENOUS

## 2015-01-06 MED ORDER — CEFAZOLIN SODIUM-DEXTROSE 2-3 GM-% IV SOLR
INTRAVENOUS | Status: DC | PRN
  Administered 2015-01-06: 2 g via INTRAVENOUS

## 2015-01-06 MED ORDER — LISDEXAMFETAMINE DIMESYLATE 20 MG PO CAPS
20.0000 mg | ORAL_CAPSULE | Freq: Every day | ORAL | Status: DC
  Administered 2015-01-06 – 2015-01-07 (×2): 20 mg via ORAL
  Filled 2015-01-06 (×2): qty 1

## 2015-01-06 MED ORDER — FENTANYL CITRATE (PF) 250 MCG/5ML IJ SOLN
INTRAMUSCULAR | Status: AC
  Filled 2015-01-06: qty 5

## 2015-01-06 MED ORDER — EPHEDRINE SULFATE 50 MG/ML IJ SOLN
INTRAMUSCULAR | Status: DC | PRN
  Administered 2015-01-06: 15 mg via INTRAVENOUS
  Administered 2015-01-06 (×3): 10 mg via INTRAVENOUS

## 2015-01-06 MED ORDER — ALBUTEROL SULFATE HFA 108 (90 BASE) MCG/ACT IN AERS: 2.0000 | INHALATION_SPRAY | Freq: Four times a day (QID) | RESPIRATORY_TRACT | Status: DC | PRN

## 2015-01-06 MED ORDER — FLUOXETINE HCL 20 MG PO CAPS
80.0000 mg | ORAL_CAPSULE | Freq: Every day | ORAL | Status: DC
  Administered 2015-01-06 – 2015-01-07 (×2): 80 mg via ORAL
  Filled 2015-01-06 (×2): qty 4

## 2015-01-06 MED ORDER — PHENYLEPHRINE 40 MCG/ML (10ML) SYRINGE FOR IV PUSH (FOR BLOOD PRESSURE SUPPORT)
PREFILLED_SYRINGE | INTRAVENOUS | Status: AC
  Filled 2015-01-06: qty 20

## 2015-01-06 MED ORDER — ALBUTEROL SULFATE (2.5 MG/3ML) 0.083% IN NEBU: 2.5000 mg | INHALATION_SOLUTION | Freq: Four times a day (QID) | RESPIRATORY_TRACT | Status: DC | PRN

## 2015-01-06 MED ORDER — OXYCODONE HCL 5 MG PO TABS: 5.0000 mg | ORAL_TABLET | Freq: Once | ORAL | Status: DC | PRN

## 2015-01-06 MED ORDER — PROPOFOL 10 MG/ML IV BOLUS
INTRAVENOUS | Status: DC | PRN
  Administered 2015-01-06: 150 mg via INTRAVENOUS

## 2015-01-06 MED ORDER — ASPIRIN EC 81 MG PO TBEC
81.0000 mg | DELAYED_RELEASE_TABLET | Freq: Every day | ORAL | Status: DC
  Administered 2015-01-06 – 2015-01-07 (×2): 81 mg via ORAL
  Filled 2015-01-06 (×2): qty 1

## 2015-01-06 MED ORDER — MIDAZOLAM HCL 5 MG/5ML IJ SOLN
INTRAMUSCULAR | Status: DC | PRN
  Administered 2015-01-06: 2 mg via INTRAVENOUS

## 2015-01-06 MED ORDER — ATORVASTATIN CALCIUM 80 MG PO TABS
80.0000 mg | ORAL_TABLET | Freq: Every day | ORAL | Status: DC
  Administered 2015-01-06 – 2015-01-07 (×2): 80 mg via ORAL
  Filled 2015-01-06 (×2): qty 1

## 2015-01-06 MED ORDER — FLUTICASONE PROPIONATE 50 MCG/ACT NA SUSP: 2.0000 | Freq: Every day | NASAL | Status: DC | PRN

## 2015-01-06 MED ORDER — LEVOTHYROXINE SODIUM 88 MCG PO TABS
88.0000 ug | ORAL_TABLET | Freq: Every day | ORAL | Status: DC
  Administered 2015-01-06 – 2015-01-07 (×2): 88 ug via ORAL
  Filled 2015-01-06 (×2): qty 1

## 2015-01-06 MED ORDER — CEFAZOLIN SODIUM 1-5 GM-% IV SOLN
1.0000 g | Freq: Three times a day (TID) | INTRAVENOUS | Status: DC
  Administered 2015-01-06 – 2015-01-07 (×2): 1 g via INTRAVENOUS
  Filled 2015-01-06 (×3): qty 50

## 2015-01-06 MED ORDER — IOHEXOL 350 MG/ML SOLN
80.0000 mL | Freq: Once | INTRAVENOUS | Status: AC | PRN
  Administered 2015-01-06: 80 mL via INTRAVENOUS

## 2015-01-06 MED ORDER — HYDROMORPHONE HCL 1 MG/ML IJ SOLN: 0.2500 mg | INTRAMUSCULAR | Status: DC | PRN

## 2015-01-06 SURGICAL SUPPLY — 62 items
BANDAGE ELASTIC 4 VELCRO ST LF (GAUZE/BANDAGES/DRESSINGS) ×6 IMPLANT
BNDG COHESIVE 4X5 TAN STRL (GAUZE/BANDAGES/DRESSINGS) ×3 IMPLANT
BNDG CONFORM 2 STRL LF (GAUZE/BANDAGES/DRESSINGS) ×3 IMPLANT
BNDG ELASTIC 2 VLCR STRL LF (GAUZE/BANDAGES/DRESSINGS) ×3 IMPLANT
BNDG ESMARK 4X9 LF (GAUZE/BANDAGES/DRESSINGS) IMPLANT
BNDG GAUZE ELAST 4 BULKY (GAUZE/BANDAGES/DRESSINGS) ×3 IMPLANT
CANISTER WOUND CARE 500ML ATS (WOUND CARE) ×3 IMPLANT
CHLORAPREP W/TINT 26ML (MISCELLANEOUS) ×3 IMPLANT
COVER SURGICAL LIGHT HANDLE (MISCELLANEOUS) ×6 IMPLANT
CUFF TOURNIQUET SINGLE 18IN (TOURNIQUET CUFF) ×6 IMPLANT
CUFF TOURNIQUET SINGLE 24IN (TOURNIQUET CUFF) IMPLANT
DRAPE PROXIMA HALF (DRAPES) ×3 IMPLANT
DRAPE SURG 17X23 STRL (DRAPES) ×6 IMPLANT
DRSG ADAPTIC 3X8 NADH LF (GAUZE/BANDAGES/DRESSINGS) ×3 IMPLANT
DRSG VAC ATS MED SENSATRAC (GAUZE/BANDAGES/DRESSINGS) ×3 IMPLANT
ELECT REM PT RETURN 9FT ADLT (ELECTROSURGICAL)
ELECTRODE REM PT RTRN 9FT ADLT (ELECTROSURGICAL) IMPLANT
EVACUATOR 1/8 PVC DRAIN (DRAIN) IMPLANT
GAUZE SPONGE 4X4 12PLY STRL (GAUZE/BANDAGES/DRESSINGS) ×3 IMPLANT
GAUZE SPONGE 4X4 16PLY XRAY LF (GAUZE/BANDAGES/DRESSINGS) ×6 IMPLANT
GLOVE BIO SURGEON STRL SZ 6.5 (GLOVE) ×4 IMPLANT
GLOVE BIO SURGEON STRL SZ7 (GLOVE) ×3 IMPLANT
GLOVE BIO SURGEON STRL SZ7.5 (GLOVE) ×3 IMPLANT
GLOVE BIO SURGEONS STRL SZ 6.5 (GLOVE) ×2
GLOVE BIOGEL PI IND STRL 6.5 (GLOVE) ×1 IMPLANT
GLOVE BIOGEL PI IND STRL 7.5 (GLOVE) ×1 IMPLANT
GLOVE BIOGEL PI IND STRL 8 (GLOVE) ×1 IMPLANT
GLOVE BIOGEL PI INDICATOR 6.5 (GLOVE) ×2
GLOVE BIOGEL PI INDICATOR 7.5 (GLOVE) ×2
GLOVE BIOGEL PI INDICATOR 8 (GLOVE) ×2
GOWN STRL REUS W/ TWL LRG LVL3 (GOWN DISPOSABLE) ×2 IMPLANT
GOWN STRL REUS W/ TWL XL LVL3 (GOWN DISPOSABLE) ×1 IMPLANT
GOWN STRL REUS W/TWL LRG LVL3 (GOWN DISPOSABLE) ×4
GOWN STRL REUS W/TWL XL LVL3 (GOWN DISPOSABLE) ×2
HANDPIECE INTERPULSE COAX TIP (DISPOSABLE)
KIT BASIN OR (CUSTOM PROCEDURE TRAY) ×3 IMPLANT
KIT ROOM TURNOVER OR (KITS) ×3 IMPLANT
MANIFOLD NEPTUNE II (INSTRUMENTS) IMPLANT
NS IRRIG 1000ML POUR BTL (IV SOLUTION) ×3 IMPLANT
PACK ORTHO EXTREMITY (CUSTOM PROCEDURE TRAY) ×3 IMPLANT
PAD ARMBOARD 7.5X6 YLW CONV (MISCELLANEOUS) ×6 IMPLANT
PAD CAST 4YDX4 CTTN HI CHSV (CAST SUPPLIES) ×1 IMPLANT
PADDING CAST COTTON 4X4 STRL (CAST SUPPLIES) ×2
PENCIL BUTTON HOLSTER BLD 10FT (ELECTRODE) ×3 IMPLANT
SET HNDPC FAN SPRY TIP SCT (DISPOSABLE) IMPLANT
SPLINT FIBERGLASS 3X35 (CAST SUPPLIES) ×3 IMPLANT
SPONGE GAUZE 4X4 12PLY STER LF (GAUZE/BANDAGES/DRESSINGS) ×3 IMPLANT
SPONGE LAP 18X18 X RAY DECT (DISPOSABLE) IMPLANT
STOCKINETTE IMPERVIOUS 9X36 MD (GAUZE/BANDAGES/DRESSINGS) IMPLANT
SUT ETHILON 3 0 PS 1 (SUTURE) ×6 IMPLANT
SUT ETHILON 4 0 PS 2 18 (SUTURE) IMPLANT
SUT PROLENE 1 CT (SUTURE) IMPLANT
SUT VICRYL RAPIDE 4/0 PS 2 (SUTURE) IMPLANT
TOWEL OR 17X24 6PK STRL BLUE (TOWEL DISPOSABLE) ×3 IMPLANT
TOWEL OR 17X26 10 PK STRL BLUE (TOWEL DISPOSABLE) ×6 IMPLANT
TUBE ANAEROBIC SPECIMEN COL (MISCELLANEOUS) IMPLANT
TUBE CONNECTING 12'X1/4 (SUCTIONS) ×1
TUBE CONNECTING 12X1/4 (SUCTIONS) ×2 IMPLANT
TUBING CYSTO DISP (UROLOGICAL SUPPLIES) ×3 IMPLANT
UNDERPAD 30X30 INCONTINENT (UNDERPADS AND DIAPERS) ×3 IMPLANT
WATER STERILE IRR 1000ML POUR (IV SOLUTION) IMPLANT
YANKAUER SUCT BULB TIP NO VENT (SUCTIONS) IMPLANT

## 2015-01-06 NOTE — Transfer of Care (Signed)
Immediate Anesthesia Transfer of Care Note  Patient: Amy Hamilton  Procedure(s) Performed: Procedure(s): CARPAL TUNNEL RELEASE (Left) IRRIGATION AND DEBRIDEMENT EXTREMITY, EVACUATION HEMATOMA (Left) APPLICATION OF WOUND VAC (Left)  Patient Location: PACU  Anesthesia Type:General  Level of Consciousness: awake, alert  and oriented  Airway & Oxygen Therapy: Patient Spontanous Breathing and Patient connected to face mask oxygen  Post-op Assessment: Report given to RN and Post -op Vital signs reviewed and stable  Post vital signs: Reviewed and stable  Last Vitals:  Filed Vitals:   01/06/15 0751  BP: 157/95  Pulse: 77  Temp: 36.9 C  Resp: 18    Complications: No apparent anesthesia complications

## 2015-01-06 NOTE — Op Note (Signed)
01/05/2015 - 01/06/2015  2:50 PM  PATIENT:  Amy Hamilton  64 y.o. female  PRE-OPERATIVE DIAGNOSIS:  Left forearm postoperative hematoma and acute carpal tunnel syndrome  POST-OPERATIVE DIAGNOSIS:  Same  PROCEDURE:  Left forearm wound exploration, evacuation of hematoma, application of wound VAC dressing and open carpal tunnel release  SURGEON: Rayvon Char. Grandville Silos, MD  PHYSICIAN ASSISTANT: Morley Kos, OPA-C  ANESTHESIA:  general  SPECIMENS:  None  DRAINS:   None  EBL:  less than 50 mL  PREOPERATIVE INDICATIONS:  Amy Hamilton is a  64 y.o. female with left forearm swelling, pain, and surgical hematoma associated with previous ORIF radius fracture. In addition her hand was much more swollen with some diminished sensation concerning for acute carpal tunnel syndrome  The risks benefits and alternatives were discussed with the patient preoperatively including but not limited to the risks of infection, bleeding, nerve injury, cardiopulmonary complications, the need for revision surgery, among others, and the patient verbalized understanding and consented to proceed.  OPERATIVE IMPLANTS: None  OPERATIVE PROCEDURE:  After receiving prophylactic antibiotics, the patient was escorted to the operative theatre and placed in a supine position. General anesthesia was administered A surgical "time-out" was performed during which the planned procedure, proposed operative site, and the correct patient identity were compared to the operative consent and agreement confirmed by the circulating nurse according to current facility policy.  Following application of a tourniquet to the operative extremity, the exposed skin was prepped with Betadine and draped in the usual sterile fashion.  The limb was exsanguinated with an Esmarch bandage and the tourniquet inflated to approximately 139mHg higher than systolic BP.  The incision had already been opened on the floor by me, so the hematoma was evacuated  digitally. The wound was explored there appeared to be no active surgical bleeding. There was one small vein bleeding on the skin edges and this was cauterized. An open carpal tunnel release was performed sharply with a scalpel through the skin and subcutaneous tissues, dividing the transverse carpal ligament and then under direct visualization with loupe assistance splitting the forearm fascia between the 2 incisions to ensure that complete decompression of the median nerve been obtained. All the wounds were washed copiously, with cystoscopy tubing and fluid. A wound VAC dressing was applied to the larger forearm wound and the hand wound was closed with 3-0 nylon interrupted sutures. A light dressing was applied to that portion and then a sugar tong splint was applied with loose Ace wrap overwrap. She was awakened and taken to the recovery room stable condition, breathing spontaneously  DISPOSITION: She will return to the floor to continue her in-hospital care, likely being discharged with an ambulatory wound VAC for later wound closure.

## 2015-01-06 NOTE — Progress Notes (Addendum)
Patient seen and examined Agree with Lavina Hamman, MD assessment and plan  Patient seen by  Milly Jakob, MD , from orthopedics, patient is status post-ORIF of the left radius shaft, came in with swelling of her entire left upper extremity Left upper extremity venous Doppler is pending Patient also waiting on a 2-D echo Nothing by mouth as the patient may go to the OR for wound exploration with acute carpal tunnel release  Cardiology saw the patient , and recommended that patient can be discharged from a cardiac standpoint for an outpatient stress test  Do not anticipate discharge today

## 2015-01-06 NOTE — Anesthesia Procedure Notes (Signed)
Procedure Name: LMA Insertion Performed by: Gean Maidens Pre-anesthesia Checklist: Patient identified, Emergency Drugs available, Suction available, Timeout performed and Patient being monitored Patient Re-evaluated:Patient Re-evaluated prior to inductionOxygen Delivery Method: Circle system utilized Preoxygenation: Pre-oxygenation with 100% oxygen Intubation Type: IV induction Ventilation: Mask ventilation without difficulty LMA: LMA inserted LMA Size: 4.0 Number of attempts: 1 Placement Confirmation: positive ETCO2,  CO2 detector and breath sounds checked- equal and bilateral Tube secured with: Tape Dental Injury: Teeth and Oropharynx as per pre-operative assessment

## 2015-01-06 NOTE — Addendum Note (Signed)
Addendum  created 01/06/15 1844 by Roberts Gaudy, MD   Modules edited: Anesthesia Attestations

## 2015-01-06 NOTE — Progress Notes (Signed)
Venous doppler US L UE negative for DVT in upper arm/SCV Will take to OR for wound exploration, evacuation of HT, MN decompression with CTR for acute CTS.  G/R/O reviewed and consent obtained.

## 2015-01-06 NOTE — Progress Notes (Signed)
  Echocardiogram 2D Echocardiogram has been performed.  Amy Hamilton 01/06/2015, 11:17 AM
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
## 2015-01-06 NOTE — Anesthesia Preprocedure Evaluation (Addendum)
Anesthesia Evaluation  Patient identified by MRN, date of birth, ID band Patient awake    Reviewed: Allergy & Precautions, NPO status , Patient's Chart, lab work & pertinent test results  Airway Mallampati: II  TM Distance: >3 FB Neck ROM: Full    Dental  (+) Edentulous Upper, Edentulous Lower   Pulmonary Current Smoker,  breath sounds clear to auscultation        Cardiovascular negative cardio ROS  Rhythm:Regular Rate:Normal     Neuro/Psych Depression negative neurological ROS     GI/Hepatic negative GI ROS, Neg liver ROS,   Endo/Other  Hypothyroidism   Renal/GU negative Renal ROS     Musculoskeletal  (+) Arthritis -,   Abdominal   Peds  Hematology  (+) anemia ,   Anesthesia Other Findings   Reproductive/Obstetrics                            Anesthesia Physical  Anesthesia Plan  ASA: II  Anesthesia Plan: General   Post-op Pain Management:    Induction: Intravenous  Airway Management Planned: LMA  Additional Equipment:   Intra-op Plan:   Post-operative Plan: Extubation in OR  Informed Consent: I have reviewed the patients History and Physical, chart, labs and discussed the procedure including the risks, benefits and alternatives for the proposed anesthesia with the patient or authorized representative who has indicated his/her understanding and acceptance.   Dental advisory given  Plan Discussed with: CRNA, Anesthesiologist and Surgeon  Anesthesia Plan Comments:         Anesthesia Quick Evaluation

## 2015-01-06 NOTE — Anesthesia Postprocedure Evaluation (Signed)
  Anesthesia Post-op Note  Patient: Amy Hamilton  Procedure(s) Performed: Procedure(s): CARPAL TUNNEL RELEASE (Left) IRRIGATION AND DEBRIDEMENT EXTREMITY, EVACUATION HEMATOMA (Left) APPLICATION OF WOUND VAC (Left)  Patient Location: PACU  Anesthesia Type:General  Level of Consciousness: awake  Airway and Oxygen Therapy: Patient Spontanous Breathing  Post-op Pain: none  Post-op Assessment: Post-op Vital signs reviewed, Patient's Cardiovascular Status Stable, Respiratory Function Stable, Patent Airway, No signs of Nausea or vomiting and Pain level controlled              Post-op Vital Signs: Reviewed and stable  Last Vitals:  Filed Vitals:   01/06/15 1530  BP: 111/76  Pulse: 73  Temp: 36.7 C  Resp: 19    Complications: No apparent anesthesia complications

## 2015-01-06 NOTE — Progress Notes (Signed)
S/p ORIF left radius shaft and cross-pinning ulna to radius distally at MN Monday AM Has swollen hand, intact LT throughout, including median distribution. Splint has been removed. FA not as swollen as hand. Can flex and extend all the digits, but amount limited by tightness of the digits.  I opened up the incision, evacuated some HT and left open.  CTA of chest negative for PE. Pt to get ECHO, and I have requested Duplex of L UE. Unfortunately, patient got lovenox already today.  Will add SCDs and D/C lovenox unless/until there is evidence of VTE in light of findings in arm.  Keep NPO, may need to return to OR for wound exploration and acute carpal tunnel release.

## 2015-01-06 NOTE — Progress Notes (Signed)
Preliminary results by tech - Left Upper Ext. Venous Duplex Completed. Negative for deep vein thrombosis in the IJV, subclavian vein, brachial vein and proximal radial and ulnar veins. The radial and ulnar veins at the mid and distal level of the forearm were not visualized due to bandages.  Oda Cogan, BS, RDMS, RVT
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
## 2015-01-07 ENCOUNTER — Other Ambulatory Visit: Payer: Self-pay | Admitting: Orthopedic Surgery

## 2015-01-07 ENCOUNTER — Encounter (HOSPITAL_COMMUNITY): Payer: Self-pay | Admitting: Orthopedic Surgery

## 2015-01-07 DIAGNOSIS — M96830 Postprocedural hemorrhage and hematoma of a musculoskeletal structure following a musculoskeletal system procedure: Secondary | ICD-10-CM

## 2015-01-07 DIAGNOSIS — R0789 Other chest pain: Secondary | ICD-10-CM | POA: Diagnosis present

## 2015-01-07 DIAGNOSIS — G5602 Carpal tunnel syndrome, left upper limb: Secondary | ICD-10-CM | POA: Diagnosis present

## 2015-01-07 DIAGNOSIS — IMO0002 Reserved for concepts with insufficient information to code with codable children: Secondary | ICD-10-CM

## 2015-01-07 DIAGNOSIS — E871 Hypo-osmolality and hyponatremia: Secondary | ICD-10-CM

## 2015-01-07 DIAGNOSIS — E876 Hypokalemia: Secondary | ICD-10-CM

## 2015-01-07 DIAGNOSIS — D62 Acute posthemorrhagic anemia: Secondary | ICD-10-CM

## 2015-01-07 HISTORY — DX: Reserved for concepts with insufficient information to code with codable children: IMO0002

## 2015-01-07 HISTORY — DX: Other chest pain: R07.89

## 2015-01-07 HISTORY — DX: Hypokalemia: E87.6

## 2015-01-07 HISTORY — DX: Carpal tunnel syndrome, left upper limb: G56.02

## 2015-01-07 HISTORY — DX: Acute posthemorrhagic anemia: D62

## 2015-01-07 LAB — COMPREHENSIVE METABOLIC PANEL
ALBUMIN: 2.6 g/dL — AB (ref 3.5–5.0)
ALT: 12 U/L — ABNORMAL LOW (ref 14–54)
AST: 22 U/L (ref 15–41)
Alkaline Phosphatase: 58 U/L (ref 38–126)
Anion gap: 7 (ref 5–15)
BUN: 6 mg/dL (ref 6–20)
CHLORIDE: 95 mmol/L — AB (ref 101–111)
CO2: 25 mmol/L (ref 22–32)
CREATININE: 0.6 mg/dL (ref 0.44–1.00)
Calcium: 7.9 mg/dL — ABNORMAL LOW (ref 8.9–10.3)
GFR calc Af Amer: >60 mL/min (ref >60)
GFR calc non Af Amer: >60 mL/min (ref >60)
Glucose, Bld: 130 mg/dL — ABNORMAL HIGH (ref 65–99)
Potassium: 3.4 mmol/L — ABNORMAL LOW (ref 3.5–5.1)
Sodium: 127 mmol/L — ABNORMAL LOW (ref 135–145)
Total Bilirubin: 0.6 mg/dL (ref 0.3–1.2)
Total Protein: 4.8 g/dL — ABNORMAL LOW (ref 6.5–8.1)

## 2015-01-07 LAB — CBC
HCT: 23.3 % — ABNORMAL LOW (ref 36.0–46.0)
HEMOGLOBIN: 8.2 g/dL — AB (ref 12.0–15.0)
MCH: 31.2 pg (ref 26.0–34.0)
MCHC: 35.2 g/dL (ref 30.0–36.0)
MCV: 88.6 fL (ref 78.0–100.0)
PLATELETS: 297 10*3/uL (ref 150–400)
RBC: 2.63 MIL/uL — AB (ref 3.87–5.11)
RDW: 12.4 % (ref 11.5–15.5)
WBC: 11.2 10*3/uL — ABNORMAL HIGH (ref 4.0–10.5)

## 2015-01-07 LAB — TROPONIN I
Troponin I: <0.03 ng/mL (ref <0.031)
Troponin I: <0.03 ng/mL (ref <0.031)

## 2015-01-07 LAB — OSMOLALITY, URINE: Osmolality, Ur: 138 mOsm/kg — ABNORMAL LOW (ref 390–1090)

## 2015-01-07 MED ORDER — POTASSIUM CHLORIDE CRYS ER 20 MEQ PO TBCR
40.0000 meq | EXTENDED_RELEASE_TABLET | Freq: Once | ORAL | Status: AC
  Administered 2015-01-07: 40 meq via ORAL
  Filled 2015-01-07: qty 2

## 2015-01-07 MED ORDER — METOPROLOL TARTRATE 25 MG PO TABS: 12.5000 mg | ORAL_TABLET | Freq: Two times a day (BID) | ORAL | Status: DC

## 2015-01-07 MED ORDER — CEPHALEXIN 500 MG PO CAPS: 500.0000 mg | ORAL_CAPSULE | Freq: Three times a day (TID) | ORAL | Status: DC

## 2015-01-07 MED ORDER — MAGNESIUM HYDROXIDE 400 MG/5ML PO SUSP
15.0000 mL | Freq: Every day | ORAL | Status: DC | PRN
  Administered 2015-01-07: 15 mL via ORAL
  Filled 2015-01-07 (×2): qty 30

## 2015-01-07 MED ORDER — ASPIRIN 81 MG PO TBEC
81.0000 mg | DELAYED_RELEASE_TABLET | Freq: Every day | ORAL | Status: AC
Start: 2015-01-07 — End: ?

## 2015-01-07 NOTE — Progress Notes (Signed)
UR Completed Latania Bascomb Graves-Bigelow, RN,BSN [phone]  

## 2015-01-07 NOTE — Care Management Note (Addendum)
Case Management Note  Patient Details  Name: Amy Hamilton MRN: [id number] Date of Birth: 1950-08-06  Subjective/Objective:   Pt admitted for cp and ORIF L Radius Fracture. Pt is from home with husband and plan is to return home with [facility] services.                  Action/Plan: CM did speak with pt in regards [facility] and pt chose [facility] for services. Referral made and SOC to begin within 24-48 hrs post d/c. No further needs from CM at this time.  1614 01-07-15 Wound VAC approved and unsure of Estimated time of arrival.   Expected Discharge Date:  01/07/15               Expected Discharge Plan:  [address]  In-House Referral:     Discharge planning Services  CM Consult  Post Acute Care Choice:  Durable Medical Equipment, Home Health Choice offered to:  Patient  DME Arranged:  Vac DME Agency:  [facility]  HH Arranged:  RN [place] Agency:  [place]  Status of Service:  Completed, signed off  Medicare Important Message Given:  No Date Medicare IM Given:    Medicare IM give by:    Date Additional Medicare IM Given:    Additional Medicare Important Message give by:     If discussed at [place] of Stay Meetings, dates discussed:    Additional Comments:  Bethena Roys, RN 01/07/2015, 11:28 AM
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
## 2015-01-07 NOTE — Discharge Instructions (Signed)
Keep left hand elevated at all times to reduce the swelling. If pain worsens,  have symptoms of numbness and or discoloration of the hand or forearm, please call your primary doctor or return to the emergency department.

## 2015-01-13 ENCOUNTER — Encounter (HOSPITAL_BASED_OUTPATIENT_CLINIC_OR_DEPARTMENT_OTHER): Payer: Self-pay | Admitting: *Deleted

## 2015-01-15 ENCOUNTER — Ambulatory Visit (HOSPITAL_BASED_OUTPATIENT_CLINIC_OR_DEPARTMENT_OTHER): Payer: Medicaid Other | Admitting: Anesthesiology

## 2015-01-15 ENCOUNTER — Encounter (HOSPITAL_BASED_OUTPATIENT_CLINIC_OR_DEPARTMENT_OTHER): Payer: Self-pay | Admitting: *Deleted

## 2015-01-15 ENCOUNTER — Encounter (HOSPITAL_BASED_OUTPATIENT_CLINIC_OR_DEPARTMENT_OTHER)
Admission: RE | Disposition: A | Discharge status: Home / Self Care | Payer: Self-pay | Source: Ambulatory Visit | Attending: Orthopedic Surgery

## 2015-01-15 ENCOUNTER — Ambulatory Visit (HOSPITAL_BASED_OUTPATIENT_CLINIC_OR_DEPARTMENT_OTHER)
Admission: RE | Admit: 2015-01-15 | Discharge: 2015-01-15 | Disposition: A | Discharge status: Home / Self Care | Payer: Medicaid Other | Source: Ambulatory Visit | Attending: Orthopedic Surgery | Admitting: Orthopedic Surgery

## 2015-01-15 DIAGNOSIS — W2210XD Striking against or struck by unspecified automobile airbag, subsequent encounter: Secondary | ICD-10-CM | POA: Insufficient documentation

## 2015-01-15 DIAGNOSIS — S51802D Unspecified open wound of left forearm, subsequent encounter: Secondary | ICD-10-CM | POA: Insufficient documentation

## 2015-01-15 DIAGNOSIS — M549 Dorsalgia, unspecified: Secondary | ICD-10-CM | POA: Insufficient documentation

## 2015-01-15 DIAGNOSIS — F17210 Nicotine dependence, cigarettes, uncomplicated: Secondary | ICD-10-CM | POA: Insufficient documentation

## 2015-01-15 DIAGNOSIS — G8929 Other chronic pain: Secondary | ICD-10-CM | POA: Diagnosis not present

## 2015-01-15 HISTORY — PX: SECONDARY CLOSURE OF WOUND: SHX6208

## 2015-01-15 LAB — POCT HEMOGLOBIN-HEMACUE
Hemoglobin: 10.2 g/dL — ABNORMAL LOW (ref 12.0–15.0)
Hemoglobin: 7.8 g/dL — ABNORMAL LOW (ref 12.0–15.0)

## 2015-01-15 SURGERY — SECONDARY CLOSURE OF WOUND
Anesthesia: General | Site: Arm Lower | Laterality: Left

## 2015-01-15 MED ORDER — CEFAZOLIN SODIUM-DEXTROSE 2-3 GM-% IV SOLR: 2.0000 g | INTRAVENOUS | Status: DC

## 2015-01-15 MED ORDER — DEXTROSE 5 % IV SOLN
10.0000 mg | INTRAVENOUS | Status: DC | PRN
  Administered 2015-01-15: 40 ug/min via INTRAVENOUS

## 2015-01-15 MED ORDER — PROPOFOL 10 MG/ML IV BOLUS
INTRAVENOUS | Status: DC | PRN
  Administered 2015-01-15: 120 mg via INTRAVENOUS

## 2015-01-15 MED ORDER — GLYCOPYRROLATE 0.2 MG/ML IJ SOLN
0.2000 mg | Freq: Once | INTRAMUSCULAR | Status: AC | PRN
  Administered 2015-01-15: 0.2 mg via INTRAVENOUS

## 2015-01-15 MED ORDER — OXYCODONE HCL 5 MG PO TABS
ORAL_TABLET | ORAL | Status: AC
Start: 2015-01-15 — End: 2015-01-15
  Filled 2015-01-15: qty 1

## 2015-01-15 MED ORDER — SCOPOLAMINE 1 MG/3DAYS TD PT72: 1.0000 | MEDICATED_PATCH | Freq: Once | TRANSDERMAL | Status: DC | PRN

## 2015-01-15 MED ORDER — ONDANSETRON HCL 4 MG/2ML IJ SOLN: 4.0000 mg | Freq: Once | INTRAMUSCULAR | Status: DC | PRN

## 2015-01-15 MED ORDER — CEFAZOLIN SODIUM-DEXTROSE 2-3 GM-% IV SOLR
INTRAVENOUS | Status: AC
  Filled 2015-01-15: qty 50

## 2015-01-15 MED ORDER — LIDOCAINE HCL (CARDIAC) 20 MG/ML IV SOLN
INTRAVENOUS | Status: DC | PRN
  Administered 2015-01-15: 30 mg via INTRAVENOUS

## 2015-01-15 MED ORDER — FENTANYL CITRATE (PF) 100 MCG/2ML IJ SOLN: 25.0000 ug | INTRAMUSCULAR | Status: DC | PRN

## 2015-01-15 MED ORDER — BUPIVACAINE-EPINEPHRINE 0.5% -1:200000 IJ SOLN
INTRAMUSCULAR | Status: DC | PRN
  Administered 2015-01-15: 10 mL

## 2015-01-15 MED ORDER — OXYCODONE HCL 5 MG PO TABS
5.0000 mg | ORAL_TABLET | Freq: Once | ORAL | Status: AC | PRN
  Administered 2015-01-15: 5 mg via ORAL

## 2015-01-15 MED ORDER — MIDAZOLAM HCL 2 MG/2ML IJ SOLN
INTRAMUSCULAR | Status: AC
  Filled 2015-01-15: qty 2

## 2015-01-15 MED ORDER — DEXAMETHASONE SODIUM PHOSPHATE 4 MG/ML IJ SOLN
INTRAMUSCULAR | Status: DC | PRN
  Administered 2015-01-15: 8 mg via INTRAVENOUS

## 2015-01-15 MED ORDER — ONDANSETRON HCL 4 MG/2ML IJ SOLN
INTRAMUSCULAR | Status: DC | PRN
  Administered 2015-01-15: 4 mg via INTRAVENOUS

## 2015-01-15 MED ORDER — FENTANYL CITRATE (PF) 100 MCG/2ML IJ SOLN
50.0000 ug | INTRAMUSCULAR | Status: DC | PRN
  Administered 2015-01-15: 50 ug via INTRAVENOUS

## 2015-01-15 MED ORDER — EPHEDRINE SULFATE 50 MG/ML IJ SOLN
INTRAMUSCULAR | Status: DC | PRN
  Administered 2015-01-15 (×2): 10 mg via INTRAVENOUS

## 2015-01-15 MED ORDER — MIDAZOLAM HCL 2 MG/2ML IJ SOLN
1.0000 mg | INTRAMUSCULAR | Status: DC | PRN
  Administered 2015-01-15: 1 mg via INTRAVENOUS

## 2015-01-15 MED ORDER — BUPIVACAINE-EPINEPHRINE (PF) 0.5% -1:200000 IJ SOLN
INTRAMUSCULAR | Status: AC
  Filled 2015-01-15: qty 30

## 2015-01-15 MED ORDER — LACTATED RINGERS IV SOLN
INTRAVENOUS | Status: DC
  Administered 2015-01-15 (×2): via INTRAVENOUS

## 2015-01-15 MED ORDER — FENTANYL CITRATE (PF) 100 MCG/2ML IJ SOLN
INTRAMUSCULAR | Status: AC
  Filled 2015-01-15: qty 6

## 2015-01-15 MED ORDER — LACTATED RINGERS IV SOLN: INTRAVENOUS | Status: DC

## 2015-01-15 MED ORDER — OXYCODONE HCL 5 MG/5ML PO SOLN: 5.0000 mg | Freq: Once | ORAL | Status: AC | PRN

## 2015-01-15 SURGICAL SUPPLY — 42 items
BLADE MINI RND TIP GREEN BEAV (BLADE) IMPLANT
BLADE SURG 15 STRL LF DISP TIS (BLADE) ×2 IMPLANT
BLADE SURG 15 STRL SS (BLADE) ×2
BNDG COHESIVE 4X5 TAN STRL (GAUZE/BANDAGES/DRESSINGS) ×4 IMPLANT
BNDG ESMARK 4X9 LF (GAUZE/BANDAGES/DRESSINGS) IMPLANT
BNDG GAUZE ELAST 4 BULKY (GAUZE/BANDAGES/DRESSINGS) ×8 IMPLANT
BNDG PLASTER X FAST 3X3 WHT LF (CAST SUPPLIES) IMPLANT
CHLORAPREP W/TINT 26ML (MISCELLANEOUS) IMPLANT
CORDS BIPOLAR (ELECTRODE) ×4 IMPLANT
COVER BACK TABLE 60X90IN (DRAPES) ×4 IMPLANT
COVER MAYO STAND STRL (DRAPES) ×4 IMPLANT
CUFF TOURNIQUET SINGLE 18IN (TOURNIQUET CUFF) IMPLANT
DRAPE EXTREMITY T 121X128X90 (DRAPE) ×4 IMPLANT
DRAPE SURG 17X23 STRL (DRAPES) ×4 IMPLANT
DRSG EMULSION OIL 3X3 NADH (GAUZE/BANDAGES/DRESSINGS) ×4 IMPLANT
GAUZE SPONGE 4X4 12PLY STRL (GAUZE/BANDAGES/DRESSINGS) ×4 IMPLANT
GLOVE BIO SURGEON STRL SZ7.5 (GLOVE) ×4 IMPLANT
GLOVE BIOGEL PI IND STRL 7.0 (GLOVE) ×2 IMPLANT
GLOVE BIOGEL PI IND STRL 8 (GLOVE) ×2 IMPLANT
GLOVE BIOGEL PI INDICATOR 7.0 (GLOVE) ×2
GLOVE BIOGEL PI INDICATOR 8 (GLOVE) ×2
GLOVE ECLIPSE 6.5 STRL STRAW (GLOVE) ×4 IMPLANT
GOWN STRL REUS W/ TWL LRG LVL3 (GOWN DISPOSABLE) ×4 IMPLANT
GOWN STRL REUS W/TWL LRG LVL3 (GOWN DISPOSABLE) ×4
GOWN STRL REUS W/TWL XL LVL3 (GOWN DISPOSABLE) ×8 IMPLANT
NEEDLE HYPO 25X1 1.5 SAFETY (NEEDLE) ×4 IMPLANT
NS IRRIG 1000ML POUR BTL (IV SOLUTION) ×4 IMPLANT
PACK BASIN DAY SURGERY FS (CUSTOM PROCEDURE TRAY) ×4 IMPLANT
PADDING CAST ABS 4INX4YD NS (CAST SUPPLIES)
PADDING CAST ABS COTTON 4X4 ST (CAST SUPPLIES) IMPLANT
RUBBERBAND STERILE (MISCELLANEOUS) IMPLANT
SPLINT FIBERGLASS 3X35 (CAST SUPPLIES) ×4 IMPLANT
STOCKINETTE 6  STRL (DRAPES) ×2
STOCKINETTE 6 STRL (DRAPES) ×2 IMPLANT
SUT ETHILON 3 0 PS 1 (SUTURE) ×8 IMPLANT
SUT VICRYL RAPIDE 4-0 (SUTURE) IMPLANT
SUT VICRYL RAPIDE 4/0 PS 2 (SUTURE) IMPLANT
SYR BULB 3OZ (MISCELLANEOUS) IMPLANT
SYRINGE 10CC LL (SYRINGE) ×4 IMPLANT
TOWEL OR 17X24 6PK STRL BLUE (TOWEL DISPOSABLE) ×4 IMPLANT
TOWEL OR NON WOVEN STRL DISP B (DISPOSABLE) ×4 IMPLANT
UNDERPAD 30X30 (UNDERPADS AND DIAPERS) ×4 IMPLANT
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
## 2015-01-15 NOTE — Discharge Instructions (Signed)
Discharge Instructions   You have a light dressing on your hand.  You may begin gentle motion of your fingers and hand immediately, but you should not do any heavy lifting or gripping.  Elevate your hand to reduce pain & swelling of the digits.  Ice over the operative site may be helpful to reduce pain & swelling.  DO NOT USE HEAT. Pain medicine has been prescribed for you.  Use your medicine as needed over the first 48 hours, and then you can begin to taper your use. You may use Tylenol in place of your prescribed pain medication, but not IN ADDITION to it. Leave the dressing in place until the third day after your surgery and then remove it, leaving it open to air.  YOU MUST STILL CONTINUE TO USE THE SPLINT/ACE WRAP EVEN ONCE THE DRESSING HAS BEEN REMOVED. After the bandage has been removed you may shower, regularly washing the incision and letting the water run over it, but not submerging it (no swimming, soaking it in dishwater, etc.) You may drive a car when you are off of prescription pain medications and can safely control your vehicle with both hands. We will address whether therapy will be required or not when you return to the office. You may have already made your follow-up appointment when we completed your preop visit.  If not, please call our office today or the next business day to make your return appointment for 10-15 days after surgery.   Please call [phone] during normal business hours or [phone] after hours for any problems. Including the following:  - excessive redness of the incisions - drainage for more than 4 days - fever of more than 101.5 F  *Please note that pain medications will not be refilled after hours or on weekends.   Post Anesthesia Home Care Instructions  Activity: Get plenty of rest for the remainder of the day. A responsible adult should stay with you for 24 hours following the procedure.  For the next 24 hours, DO NOT: -Drive a  car -Paediatric nurse -Drink alcoholic beverages -Take any medication unless instructed by your physician -Make any legal decisions or sign important papers.  Meals: Start with liquid foods such as gelatin or soup. Progress to regular foods as tolerated. Avoid greasy, spicy, heavy foods. If nausea and/or vomiting occur, drink only clear liquids until the nausea and/or vomiting subsides. Call your physician if vomiting continues.  Special Instructions/Symptoms: Your throat may feel dry or sore from the anesthesia or the breathing tube placed in your throat during surgery. If this causes discomfort, gargle with warm salt water. The discomfort should disappear within 24 hours.  If you had a scopolamine patch placed behind your ear for the management of post- operative nausea and/or vomiting:  1. The medication in the patch is effective for 72 hours, after which it should be removed.  Wrap patch in a tissue and discard in the trash. Wash hands thoroughly with soap and water. 2. You may remove the patch earlier than 72 hours if you experience unpleasant side effects which may include dry mouth, dizziness or visual disturbances. 3. Avoid touching the patch. Wash your hands with soap and water after contact with the patch.

## 2015-01-15 NOTE — Anesthesia Postprocedure Evaluation (Signed)
  Anesthesia Post-op Note  Patient: Amy Hamilton  Procedure(s) Performed: Procedure(s): DELAYED CLOSURE OF LEFT FOREARM WOUND (Left)  Patient Location: PACU  Anesthesia Type:General  Level of Consciousness: awake, alert  and oriented  Airway and Oxygen Therapy: Patient Spontanous Breathing and Patient connected to nasal cannula oxygen  Post-op Pain: mild  Post-op Assessment: Post-op Vital signs reviewed, Patient's Cardiovascular Status Stable, Respiratory Function Stable, Patent Airway and Pain level controlled              Post-op Vital Signs: stable  Last Vitals:  Filed Vitals:   01/15/15 0800  BP: 127/71  Pulse:   Temp: 36.3 C  Resp:     Complications: No apparent anesthesia complications

## 2015-01-15 NOTE — Op Note (Signed)
01/15/2015  7:08 AM  PATIENT:  Amy Hamilton  64 y.o. female  PRE-OPERATIVE DIAGNOSIS:  Left forearm postoperative wound  POST-OPERATIVE DIAGNOSIS:  Same  PROCEDURE:  Removal of wound VAC left forearm wound, removal of sutures from left palm, debridement of skin and subcutaneous tissues of left forearm wound with delayed primary closure 15 cm, and application of long-arm splint  SURGEON: Rayvon Char. [place] Silos, MD  PHYSICIAN ASSISTANT: None  ANESTHESIA:  general  SPECIMENS:  None  DRAINS:   None  EBL:  less than 50 mL  PREOPERATIVE INDICATIONS:  Amy Hamilton is a  64 y.o. female with left forearm wound with a wound VAC applied, as well as a closed our carpal tunnel release incision  The risks benefits and alternatives were discussed with the patient preoperatively including but not limited to the risks of infection, bleeding, nerve injury, cardiopulmonary complications, the need for revision surgery, among others, and the patient verbalized understanding and consented to proceed.  OPERATIVE IMPLANTS: None  OPERATIVE PROCEDURE:  After receiving prophylactic antibiotics, the patient was escorted to the operative theatre and placed in a supine position. General anesthesia was administered A surgical "time-out" was performed during which the planned procedure, proposed operative site, and the correct patient identity were compared to the operative consent and agreement confirmed by the circulating nurse according to current facility policy.  Following application of a tourniquet to the operative extremity, the exposed skin was prepped with Betadine scrub and paint and draped in the usual sterile fashion.  The limb was exsanguinated with an Esmarch bandage and the tourniquet was not inflated.  The wound bed was debrided with the scraping action of the back of forceps. Portions of the skin edges were sharply trimmed of skin and subcutaneous tissues. The pin sites were cleaned with mechanical  debridement using a gauze sponge. Superficial fascia was elevated off the deep fascia on all the margins to allow for sufficient mobility for direct closure. The sutures are removed from the carpal tunnel release incision area the open forearm wound was then copiously irrigated and closed with 3-0 nylon interrupted vertical mattress sutures. A light dressing was applied and then a new sugar tong splint was fabricated of fiberglass and applied. She was awakened and taken to the recovery room in stable condition, breathing spontaneously  DISPOSITION: She'll be discharged home today with typical instructions, returning in 10-15 days. At that time she should have new x-rays (3 views) of the left wrist out of the splint.

## 2015-01-15 NOTE — Anesthesia Procedure Notes (Signed)
Procedure Name: LMA Insertion Date/Time: 01/15/2015 7:15 AM Performed by: Melynda Ripple D Pre-anesthesia Checklist: Patient identified, Emergency Drugs available, Suction available and Patient being monitored Patient Re-evaluated:Patient Re-evaluated prior to inductionOxygen Delivery Method: Circle System Utilized Preoxygenation: Pre-oxygenation with 100% oxygen Intubation Type: IV induction Ventilation: Mask ventilation without difficulty LMA: LMA inserted LMA Size: 3.0 Number of attempts: 1 Airway Equipment and Method: Bite block Placement Confirmation: positive ETCO2 Tube secured with: Tape Dental Injury: Teeth and Oropharynx as per pre-operative assessment

## 2015-01-15 NOTE — Anesthesia Preprocedure Evaluation (Signed)
Anesthesia Evaluation  Patient identified by MRN, date of birth, ID band Patient awake    Reviewed: Allergy & Precautions, NPO status , Patient's Chart, lab work & pertinent test results  Airway Mallampati: II  TM Distance: >3 FB Neck ROM: Full    Dental  (+) Edentulous Upper, Edentulous Lower   Pulmonary Current Smoker,  breath sounds clear to auscultation        Cardiovascular Rhythm:Regular Rate:Bradycardia     Neuro/Psych    GI/Hepatic   Endo/Other    Renal/GU      Musculoskeletal   Abdominal   Peds  Hematology   Anesthesia Other Findings   Reproductive/Obstetrics                             Anesthesia Physical Anesthesia Plan  ASA: III  Anesthesia Plan: General   Post-op Pain Management:    Induction: Intravenous  Airway Management Planned: LMA  Additional Equipment:   Intra-op Plan:   Post-operative Plan:   Informed Consent: I have reviewed the patients History and Physical, chart, labs and discussed the procedure including the risks, benefits and alternatives for the proposed anesthesia with the patient or authorized representative who has indicated his/her understanding and acceptance.     Plan Discussed with: CRNA and Anesthesiologist  Anesthesia Plan Comments:         Anesthesia Quick Evaluation

## 2015-01-15 NOTE — Transfer of Care (Signed)
Immediate Anesthesia Transfer of Care Note  Patient: Amy Hamilton  Procedure(s) Performed: Procedure(s): DELAYED CLOSURE OF LEFT FOREARM WOUND (Left)  Patient Location: PACU  Anesthesia Type:General  Level of Consciousness: awake, alert  and oriented  Airway & Oxygen Therapy: Patient Spontanous Breathing and Patient connected to face mask oxygen  Post-op Assessment: Report given to RN  Post vital signs: Reviewed and stable  Last Vitals:  Filed Vitals:   01/15/15 0642  BP: 112/64  Pulse: 42  Temp: 36.6 C  Resp: 14    Complications: No apparent anesthesia complications

## 2015-01-15 NOTE — Interval H&P Note (Signed)
History and Physical Interval Note:  01/15/2015 7:07 AM  Amy Hamilton  has presented today for surgery, with the diagnosis of left post op forearm wound, requiring delayed primary closure.  The various methods of treatment have been discussed with the patient and family. After consideration of risks, benefits and other options for treatment, the patient has consented to  Procedure(s): DELAYED CLOSURE OF LEFT FOREARM WOUND (Left) as a surgical intervention .  The patient's history has been reviewed, patient examined, no change in status, stable for surgery.  I have reviewed the patient's chart and labs.  Questions were answered to the patient's satisfaction.     Shaddai Shapley A.

## 2015-01-16 ENCOUNTER — Encounter (HOSPITAL_BASED_OUTPATIENT_CLINIC_OR_DEPARTMENT_OTHER): Payer: Self-pay | Admitting: Orthopedic Surgery

## 2015-01-19 ENCOUNTER — Other Ambulatory Visit: Payer: Self-pay | Admitting: *Deleted

## 2015-01-19 DIAGNOSIS — R079 Chest pain, unspecified: Secondary | ICD-10-CM

## 2015-01-20 ENCOUNTER — Telehealth: Payer: Self-pay | Admitting: *Deleted

## 2015-01-20 DIAGNOSIS — I2510 Atherosclerotic heart disease of native coronary artery without angina pectoris: Secondary | ICD-10-CM

## 2015-01-20 DIAGNOSIS — I2584 Coronary atherosclerosis due to calcified coronary lesion: Secondary | ICD-10-CM

## 2015-01-20 DIAGNOSIS — R079 Chest pain, unspecified: Secondary | ICD-10-CM

## 2015-01-20 NOTE — Telephone Encounter (Signed)
  Please set her up for a nuclear stress test in one month.  Chest pain. LAD coronary calcification.   Candee Furbish, MD    In reviewing pt's chart - this test has already been ordered and scheduled.

## 2015-01-21 ENCOUNTER — Telehealth (HOSPITAL_COMMUNITY): Payer: Self-pay

## 2015-01-21 NOTE — Telephone Encounter (Signed)
Left message on voicemail in reference to upcoming appointment scheduled for 01-27-2015. Phone number given for a call back so details instructions can be given. Oletta Lamas, Aliegha Paullin A

## 2015-01-22 ENCOUNTER — Telehealth (HOSPITAL_COMMUNITY): Payer: Self-pay | Admitting: *Deleted

## 2015-01-22 ENCOUNTER — Encounter (HOSPITAL_BASED_OUTPATIENT_CLINIC_OR_DEPARTMENT_OTHER): Payer: Self-pay | Admitting: Orthopedic Surgery

## 2015-01-22 NOTE — Telephone Encounter (Signed)
Patient given detailed instructions per Myocardial Perfusion Study Information Sheet for test on 01/27/15 at 0745. Patient Notified to arrive 15 minutes early, and that it is imperative to arrive on time for appointment to keep from having the test rescheduled. Patient verbalized understanding. Amy Hamilton, Ranae Palms

## 2015-01-27 ENCOUNTER — Encounter (HOSPITAL_COMMUNITY): Payer: Medicaid Other

## 2015-01-27 ENCOUNTER — Telehealth (HOSPITAL_COMMUNITY): Payer: Self-pay | Admitting: *Deleted

## 2015-01-27 NOTE — Telephone Encounter (Signed)
Patient was scheduled for a nuclear stress test today. Patient was a no show for appt. Attempted to notify patient but no answer. Message left for patient to call back to reschedule. I spoke with patient last Thursday 6/30 to remind patient of appt and instructions. Latamara Melder, Ranae Palms

## 2015-06-03 ENCOUNTER — Ambulatory Visit: Payer: Medicaid Other | Admitting: Physical Therapy

## 2015-06-04 ENCOUNTER — Ambulatory Visit: Payer: No Typology Code available for payment source | Admitting: Physical Therapy

## 2015-06-29 ENCOUNTER — Ambulatory Visit: Payer: No Typology Code available for payment source | Attending: Family Medicine | Admitting: Physical Therapy

## 2015-06-29 DIAGNOSIS — R29898 Other symptoms and signs involving the musculoskeletal system: Secondary | ICD-10-CM | POA: Diagnosis present

## 2015-06-29 DIAGNOSIS — M545 Low back pain, unspecified: Secondary | ICD-10-CM

## 2015-06-29 DIAGNOSIS — M25552 Pain in left hip: Secondary | ICD-10-CM | POA: Insufficient documentation

## 2015-06-29 NOTE — Therapy (Signed)
[facility] [place], [place], [address] Phone: [phone]   Fax:  [phone]  Physical Therapy Evaluation  Patient Details  Name: Amy Hamilton MRN: [id number] Date of Birth: 08/25/1950 Referring Provider: Meredith Pel, MD  Encounter Date: 06/29/2015      PT End of Session - 06/29/15 1301    Visit Number 1   Number of Visits 1   PT Start Time 1103   PT Stop Time 1138   PT Time Calculation (min) 35 min   Activity Tolerance Patient tolerated treatment well   Behavior During Therapy [facility] for tasks assessed/performed      Past Medical History  Diagnosis Date  . Thyroid disease   . Depression   . Uterine prolapse   . DDD (degenerative disc disease), cervical   . Chronic back pain     Past Surgical History  Procedure Laterality Date  . Femur im nail Left 10/18/2012    Procedure: INTRAMEDULLARY (IM) NAIL FEMORAL;  Surgeon: Meredith Pel, MD;  Location: [place];  Service: Orthopedics;  Laterality: Left;  . Abdominal hysterectomy    . Back surgery    . Orif ulnar fracture Left 01/04/2015    Procedure: OPEN REDUCTION INTERNAL FIXATION (ORIF) ULNAR FRACTURE;  Surgeon: Milly Jakob, MD;  Location: [place];  Service: Orthopedics;  Laterality: Left;  . Carpal tunnel release Left 01/06/2015    Procedure: CARPAL TUNNEL RELEASE;  Surgeon: Milly Jakob, MD;  Location: [place];  Service: Orthopedics;  Laterality: Left;  . I&d extremity Left 01/06/2015    Procedure: IRRIGATION AND DEBRIDEMENT EXTREMITY, EVACUATION HEMATOMA;  Surgeon: Milly Jakob, MD;  Location: [place];  Service: Orthopedics;  Laterality: Left;  . Application of wound vac Left 01/06/2015    Procedure: APPLICATION OF WOUND VAC;  Surgeon: Milly Jakob, MD;  Location: [place];  Service: Orthopedics;  Laterality: Left;  . Secondary closure of wound Left 01/15/2015    Procedure: SECONDARY CLOSURE OF WOUND LEFT FOREARM;  Surgeon: Milly Jakob, MD;   Location: [place];  Service: Orthopedics;  Laterality: Left;    There were no vitals filed for this visit.  Visit Diagnosis:  Left hip pain - Plan: PT plan of care cert/re-cert  Midline low back pain without sciatica - Plan: PT plan of care cert/re-cert  Weakness of left lower extremity - Plan: PT plan of care cert/re-cert      Subjective Assessment - 06/29/15 1108    Subjective Pt is a 64 y/o female who presents to O[facility]T with chronic LBP and L trochenteric bursitis.  Pt reports L femur fx in 2014 with IM nail and has has progressive pain since then.  Pt presents today with pain and difficulty with ADLs and functional mobility.   Limitations Sitting;Standing;Walking;House hold activities   How long can you sit comfortably? 15-20 min   How long can you stand comfortably? 20 min   How long can you walk comfortably? <10 min   Diagnostic tests n/a   Patient Stated Goals to obtain exercise program to improve pain and function   Currently in Pain? Yes   Pain Score 7    Pain Location Hip   Pain Orientation Left   Pain Descriptors / Indicators Sharp;Throbbing   Pain Type Surgical pain;Chronic pain   Pain Onset More than a month ago   Pain Frequency Constant   Aggravating Factors  sitting, standing, walking   Pain Relieving Factors ice, TENS  [facility] PT Assessment - 06/29/15 0001    Assessment   Medical Diagnosis L trochenteric bursitis   Referring Provider Meredith Pel, MD   Onset Date/Surgical Date --  2014   Next MD Visit after PT   Prior Therapy CIR after surgery   Precautions   Precautions None   Restrictions   Weight Bearing Restrictions No   Balance Screen   Has the patient fallen in the past 6 months No   Has the patient had a decrease in activity level because of a fear of falling?  No   Is the patient reluctant to leave their home because of a fear of falling?  No   Home Environment   Living Environment Private residence   Living  Arrangements Spouse/significant other   Type of [place] to enter   Entrance Stairs-Number of Steps 1   [place] Two level;Able to live on main level with bedroom/bathroom   Prior Function   Level of Independence Independent   Vocation On disability   Leisure spend time with great grandchildren, drawing, reading   Cognition   Overall Cognitive Status Within Functional Limits for tasks assessed   Strength   Strength Assessment Site Hip;Knee;Ankle   Right Hip Flexion 5/5   Right Hip Extension 4/5   Right Hip ABduction 4/5   Left Hip Flexion 3+/5   Left Hip Extension 3/5   Left Hip ABduction 3/5   Right/Left Knee Right;Left   Right Knee Flexion 5/5   Right Knee Extension 5/5   Left Knee Flexion 3/5   Left Knee Extension 4/5   Right Ankle Dorsiflexion 5/5   Left Ankle Dorsiflexion 5/5   Palpation   Palpation comment L hamstring and ITB tightness; tenderness along L greater trochanter                   [facility] Adult PT Treatment/Exercise - 06/29/15 1141    Exercises   Exercises Knee/Hip   Knee/Hip Exercises: Stretches   Passive Hamstring Stretch 1 rep;30 seconds;Left   ITB Stretch Left;1 rep;30 seconds   Knee/Hip Exercises: Supine   Bridges 5 reps   Straight Leg Raises Left;5 reps   Knee/Hip Exercises: Sidelying   Hip ABduction Left;5 reps   Knee/Hip Exercises: Prone   Hip Extension Left;5 reps                PT Education - 06/29/15 1301    Education provided Yes   Education Details HEP   Person(s) Educated Patient   Methods Explanation   Comprehension Verbalized understanding                    Plan - 06/29/15 1302    Clinical Impression Statement Pt is a 64 y/o female who presents to [facility] with low back pain and L hip pain.  Pt reports L hip pain worse than low back.  PT demonstrated L hamstring and ITB tightness as well as significant LLE weakness.  Pt provided with HEP to address these deficits.  Insurance  only to cover evaluation and pt unable to proceed as self pay therefore 1 time visit only.   PT Frequency One time visit   Consulted and Agree with Plan of Care Patient         Problem List Patient Active Problem List   Diagnosis Date Noted  . Chest pain, musculoskeletal 01/07/2015  . Postoperative hematoma 01/07/2015  . Acute carpal tunnel syndrome of left  wrist 01/07/2015  . Hypokalemia 01/07/2015  . Acute blood loss anemia 01/07/2015  . Abnormal EKG 01/06/2015  . Abdominal pain, acute 01/06/2015  . Hyponatremia 01/06/2015  . Anemia 01/06/2015  . Closed left radial fracture 01/06/2015  . S/P ORIF (open reduction internal fixation) fracture 01/06/2015  . Intertrochanteric fracture of left hip ([place]) 10/23/2012  . Hip fracture, left ([place]) 10/17/2012  . Fall at home 10/17/2012  . Hypothyroidism 10/17/2012  . Depression 10/17/2012  . Back pain 10/17/2012   Laureen Abrahams, PT, DPT 06/29/2015 1:09 PM  [facility] [address] [place], [place], [address] Phone: [phone]   Fax:  [phone]  Name: Amy Hamilton MRN: [id number] Date of Birth: [birth year]

## 2015-06-29 NOTE — Patient Instructions (Signed)
Hamstring Step 1    Straighten left knee. Keep knee level with other knee or on bolster. Hold _30__ seconds. Relax knee by returning foot to start. Repeat _2-3__ times.  Copyright  VHI. All rights reserved.    Outer Hip Stretch: Reclined IT Band Stretch (Strap)    Strap around opposite foot, pull across only as far as possible with shoulders on mat. Hold for _30___ seconds. Repeat _2-3___ times each leg.  Copyright  VHI. All rights reserved.    Hip Flexion / Knee Extension: Straight-Leg Raise (Eccentric)   Lie on back. Lift leg with knee straight. Slowly lower leg for 3-5 seconds. _10__ reps per set, _2-3__ sets per day.  ABDUCTION: Side-Lying (Active)   Lie on right side, top leg straight. Raise top leg as far as possible.  Complete _1__ sets of __10_ repetitions. Perform __2-3_ sessions per day.  http://gtsc.exer.us/94   (Home) Extension: Hip   Lift left leg in line with body. Alternate legs. Repeat _10___ times per set. Do __1__ sets per session. Do _2-3___ sessions per day.   Bridging    Slowly raise buttocks from floor, keeping stomach tight. Repeat _10___ times per set. Do _1___ sets per session. Do _2-3___ sessions per day.  http://orth.exer.us/1097   Copyright  VHI. All rights reserved.

## 2015-07-26 DIAGNOSIS — J189 Pneumonia, unspecified organism: Secondary | ICD-10-CM

## 2015-07-26 HISTORY — DX: Pneumonia, unspecified organism: J18.9

## 2015-12-10 ENCOUNTER — Emergency Department (HOSPITAL_COMMUNITY): Payer: Medicaid Other

## 2015-12-10 ENCOUNTER — Emergency Department (HOSPITAL_COMMUNITY)
Admission: EM | Admit: 2015-12-10 | Discharge: 2015-12-10 | Disposition: A | Discharge status: Home / Self Care | Payer: Medicaid Other | Attending: Emergency Medicine | Admitting: Emergency Medicine

## 2015-12-10 ENCOUNTER — Encounter (HOSPITAL_COMMUNITY): Payer: Self-pay | Admitting: Emergency Medicine

## 2015-12-10 DIAGNOSIS — Z7982 Long term (current) use of aspirin: Secondary | ICD-10-CM | POA: Diagnosis not present

## 2015-12-10 DIAGNOSIS — E079 Disorder of thyroid, unspecified: Secondary | ICD-10-CM | POA: Diagnosis not present

## 2015-12-10 DIAGNOSIS — G8929 Other chronic pain: Secondary | ICD-10-CM | POA: Insufficient documentation

## 2015-12-10 DIAGNOSIS — F329 Major depressive disorder, single episode, unspecified: Secondary | ICD-10-CM | POA: Insufficient documentation

## 2015-12-10 DIAGNOSIS — F17210 Nicotine dependence, cigarettes, uncomplicated: Secondary | ICD-10-CM | POA: Diagnosis not present

## 2015-12-10 DIAGNOSIS — Z792 Long term (current) use of antibiotics: Secondary | ICD-10-CM | POA: Insufficient documentation

## 2015-12-10 DIAGNOSIS — L97319 Non-pressure chronic ulcer of right ankle with unspecified severity: Secondary | ICD-10-CM | POA: Insufficient documentation

## 2015-12-10 DIAGNOSIS — R6883 Chills (without fever): Secondary | ICD-10-CM | POA: Insufficient documentation

## 2015-12-10 DIAGNOSIS — Z8742 Personal history of other diseases of the female genital tract: Secondary | ICD-10-CM | POA: Insufficient documentation

## 2015-12-10 DIAGNOSIS — R112 Nausea with vomiting, unspecified: Secondary | ICD-10-CM | POA: Insufficient documentation

## 2015-12-10 DIAGNOSIS — Z8781 Personal history of (healed) traumatic fracture: Secondary | ICD-10-CM | POA: Diagnosis not present

## 2015-12-10 DIAGNOSIS — Z79899 Other long term (current) drug therapy: Secondary | ICD-10-CM | POA: Diagnosis not present

## 2015-12-10 DIAGNOSIS — M25571 Pain in right ankle and joints of right foot: Secondary | ICD-10-CM | POA: Diagnosis present

## 2015-12-10 DIAGNOSIS — M25471 Effusion, right ankle: Secondary | ICD-10-CM | POA: Diagnosis not present

## 2015-12-10 LAB — CBC WITH DIFFERENTIAL/PLATELET
Basophils Absolute: 0 10*3/uL (ref 0.0–0.1)
Basophils Relative: 0 %
EOS ABS: 0.4 10*3/uL (ref 0.0–0.7)
EOS PCT: 5 %
HCT: 32.6 % — ABNORMAL LOW (ref 36.0–46.0)
Hemoglobin: 11 g/dL — ABNORMAL LOW (ref 12.0–15.0)
Lymphocytes Relative: 34 %
Lymphs Abs: 2.6 10*3/uL (ref 0.7–4.0)
MCH: 30.7 pg (ref 26.0–34.0)
MCHC: 33.7 g/dL (ref 30.0–36.0)
MCV: 91.1 fL (ref 78.0–100.0)
MONOS PCT: 7 %
Monocytes Absolute: 0.6 10*3/uL (ref 0.1–1.0)
Neutro Abs: 3.9 10*3/uL (ref 1.7–7.7)
Neutrophils Relative %: 54 %
PLATELETS: 318 10*3/uL (ref 150–400)
RBC: 3.58 MIL/uL — ABNORMAL LOW (ref 3.87–5.11)
RDW: 12.8 % (ref 11.5–15.5)
WBC: 7.4 10*3/uL (ref 4.0–10.5)

## 2015-12-10 LAB — COMPREHENSIVE METABOLIC PANEL
ALK PHOS: 67 U/L (ref 38–126)
ALT: 17 U/L (ref 14–54)
AST: 25 U/L (ref 15–41)
Albumin: 3.5 g/dL (ref 3.5–5.0)
Anion gap: 9 (ref 5–15)
BUN: 9 mg/dL (ref 6–20)
CO2: 29 mmol/L (ref 22–32)
CREATININE: 0.67 mg/dL (ref 0.44–1.00)
Calcium: 9.5 mg/dL (ref 8.9–10.3)
Chloride: 99 mmol/L — ABNORMAL LOW (ref 101–111)
GFR calc Af Amer: >60 mL/min (ref >60)
Glucose, Bld: 98 mg/dL (ref 65–99)
Potassium: 4.6 mmol/L (ref 3.5–5.1)
SODIUM: 137 mmol/L (ref 135–145)
Total Bilirubin: 0.4 mg/dL (ref 0.3–1.2)
Total Protein: 6.5 g/dL (ref 6.5–8.1)

## 2015-12-10 MED ORDER — CEPHALEXIN 500 MG PO CAPS: 500.0000 mg | ORAL_CAPSULE | Freq: Two times a day (BID) | ORAL | Status: DC

## 2015-12-10 NOTE — ED Notes (Signed)
Pt reports right ankle has been swelling for a month and she feels like the screws are backing out from where she had surgery in the past. Pt alert x4.

## 2015-12-10 NOTE — ED Provider Notes (Signed)
CSN: [id number]     Arrival date & time 12/10/15  1817 History  By signing my name below, I, Amy Hamilton, attest that this documentation has been prepared under the direction and in the presence of Amy Filler, PA-C. Electronically Signed: Eustaquio Hamilton, ED Scribe. 12/10/2015. 7:44 PM.   Chief Complaint  Patient presents with  . Ankle Pain   The history is provided by the patient. No language interpreter was used.    HPI Comments: Amy Hamilton is a 65 y.o. female who presents to the Emergency Department complaining of gradual onset, constant, 6/10, worsening right ankle pain x 1 month. The pain is exacerbated with walking and increases to a 10/10 on the pain scale. Pt had hardware placed in her ankle 10-12 years ago and reports for the past 3-4 years the screws have been gradually coming out of place. Pt was seen at [facility] at that time and Dr. Marlou Hamilton suggested to have the hardware removed. Pt declined at that time and wanted to wait until the pain became worse. She notes for the past couple of weeks she has had an open area to the lateral aspect of the ankle but denies drainage to the area. She also notes swelling and warmth to the area. Pt complains of chills, nausea, and vomiting for the past couple of days as well. She spoke with the receptionist at [facility] earlier this week attempting to get into contact with Dr. Marlou Hamilton but reports that he was in surgery. The receptionist suggested that pt come to the ED for further evaluation. She has been taking Tylenol without relief. Denies fever, chest pain, shortness of breath, abdominal pain, constipation, diarrhea, blood in stool, other joint pain or swelling, or any other associated symptoms.   Past Medical History  Diagnosis Date  . Thyroid disease   . Depression   . Uterine prolapse   . DDD (degenerative disc disease), cervical   . Chronic back pain    Past Surgical History  Procedure Laterality Date  . Femur im nail  Left 10/18/2012    Procedure: INTRAMEDULLARY (IM) NAIL FEMORAL;  Surgeon: Amy Pel, Amy Hamilton;  Location: [place];  Service: Orthopedics;  Laterality: Left;  . Abdominal hysterectomy    . Back surgery    . Orif ulnar fracture Left 01/04/2015    Procedure: OPEN REDUCTION INTERNAL FIXATION (ORIF) ULNAR FRACTURE;  Surgeon: Amy Jakob, Amy Hamilton;  Location: [place];  Service: Orthopedics;  Laterality: Left;  . Carpal tunnel release Left 01/06/2015    Procedure: CARPAL TUNNEL RELEASE;  Surgeon: Amy Jakob, Amy Hamilton;  Location: [place];  Service: Orthopedics;  Laterality: Left;  . I&d extremity Left 01/06/2015    Procedure: IRRIGATION AND DEBRIDEMENT EXTREMITY, EVACUATION HEMATOMA;  Surgeon: Amy Jakob, Amy Hamilton;  Location: [place];  Service: Orthopedics;  Laterality: Left;  . Application of wound vac Left 01/06/2015    Procedure: APPLICATION OF WOUND VAC;  Surgeon: Amy Jakob, Amy Hamilton;  Location: [place];  Service: Orthopedics;  Laterality: Left;  . Secondary closure of wound Left 01/15/2015    Procedure: SECONDARY CLOSURE OF WOUND LEFT FOREARM;  Surgeon: Amy Jakob, Amy Hamilton;  Location: [place];  Service: Orthopedics;  Laterality: Left;  . Ankle surgery Right    No family history on file. Social History  Substance Use Topics  . Smoking status: Current Every Day Smoker -- 0.50 packs/day    Types: Cigarettes  . Smokeless tobacco: None  . Alcohol Use: No   OB History  No data available     Review of Systems  Constitutional: Positive for chills. Negative for fever and diaphoresis.  Respiratory: Negative for shortness of breath.   Cardiovascular: Negative for chest pain.  Gastrointestinal: Positive for nausea and vomiting. Negative for abdominal pain, diarrhea and constipation.  Musculoskeletal: Positive for joint swelling and arthralgias (right ankle).  Skin: Positive for color change and wound.  Allergic/Immunologic: Negative for immunocompromised state.  Neurological: Negative for weakness  and numbness.   Allergies  Review of patient's allergies indicates no known allergies.  Home Medications   Prior to Admission medications   Medication Sig Start Date End Date Taking? Authorizing Provider  albuterol (PROVENTIL HFA;VENTOLIN HFA) 108 (90 BASE) MCG/ACT inhaler Inhale 1-2 puffs into the lungs every 6 (six) hours as needed for wheezing or shortness of breath. Patient taking differently: Inhale 2 puffs into the lungs every 6 (six) hours as needed for wheezing or shortness of breath.  10/26/12   Amy Paganini Angiulli, PA-C  aspirin EC 81 MG EC tablet Take 1 tablet (81 mg total) by mouth daily. 01/07/15   Amy Dhungel, Amy Hamilton  Aspirin-Acetaminophen-Caffeine (GOODY HEADACHE PO) Take 1 packet by mouth 2 (two) times daily as needed (pain).     Historical Provider, Amy Hamilton  BEE POLLEN PO Take 2 tablets by mouth daily.    Historical Provider, Amy Hamilton  cephALEXin (KEFLEX) 500 MG capsule Take 1 capsule (500 mg total) by mouth 2 (two) times daily. 12/10/15   Amy Mew, PA-C  ESTRADIOL PO Take 1 tablet by mouth daily.    Historical Provider, Amy Hamilton  FLUoxetine (PROZAC) 40 MG capsule Take 2 capsules (80 mg total) by mouth daily. 10/26/12   Amy Paganini Angiulli, PA-C  fluticasone (FLONASE) 50 MCG/ACT nasal spray Place 2 sprays into both nostrils daily as needed for allergies or rhinitis.    Historical Provider, Amy Hamilton  HYDROcodone-acetaminophen (NORCO) 7.5-325 MG per tablet Take 1-2 tablets by mouth every 4 (four) hours as needed for moderate pain. Maximum 6 tablets daily    Historical Provider, Amy Hamilton  hydrOXYzine (ATARAX/VISTARIL) 25 MG tablet Take 25 mg by mouth every 6 (six) hours as needed for anxiety.     Historical Provider, Amy Hamilton  levothyroxine (SYNTHROID, LEVOTHROID) 88 MCG tablet Take 88 mcg by mouth daily before breakfast.    Historical Provider, Amy Hamilton  lisdexamfetamine (VYVANSE) 20 MG capsule Take 20 mg by mouth daily.    Historical Provider, Amy Hamilton  lisdexamfetamine (VYVANSE) 70 MG capsule Take 1 capsule (70 mg total)  by mouth every morning. Patient not taking: Reported on 06/29/2015 10/26/12   Amy Paganini Angiulli, PA-C  metoprolol tartrate (LOPRESSOR) 25 MG tablet Take 0.5 tablets (12.5 mg total) by mouth 2 (two) times daily. 01/07/15   Amy Dhungel, Amy Hamilton  oxyCODONE-acetaminophen (PERCOCET/ROXICET) 5-325 MG per tablet Take 1-2 tablets by mouth every 6 (six) hours as needed for moderate pain or severe pain. 01/05/15   Amy Jakob, Amy Hamilton  VITAMIN E PO Take 1 capsule by mouth daily.    Historical Provider, Amy Hamilton   BP 116/76 mmHg  Pulse 65  Temp(Src) 98.4 F (36.9 C) (Oral)  Resp 18  Ht '5\' 3"'$  (1.6 m)  Wt 52.164 kg  BMI 20.38 kg/m2  SpO2 98%   Physical Exam  Constitutional: She appears well-developed and well-nourished. No distress.  HENT:  Head: Normocephalic and atraumatic.  Eyes: Conjunctivae and EOM are normal. No scleral icterus.  Neck: Neck supple. No tracheal deviation present.  Cardiovascular: Normal rate and intact distal pulses.   Pulmonary/Chest:  Effort normal. No respiratory distress.  Musculoskeletal: Normal range of motion.  Right ankle swelling noted. ROM intact of right ankle; however, pain with ROM. TTP of right lateral malleolus and 4th metatarsal head.   Neurological: She is alert. Coordination normal.  Slight decrease in gross sensation of right foot compared to left.   Skin: Skin is warm and dry.     Psychiatric: She has a normal mood and affect. Her behavior is normal.  Nursing note and vitals reviewed.   ED Course  Procedures (including critical care time)  DIAGNOSTIC STUDIES: Oxygen Saturation is 98% on RA, normal by my interpretation.    COORDINATION OF CARE: 7:44 PM-Discussed treatment plan with pt at bedside and pt agreed to plan.   Labs Review Labs Reviewed  COMPREHENSIVE METABOLIC PANEL - Abnormal; Notable for the following:    Chloride 99 (*)    All other components within normal limits  CBC WITH DIFFERENTIAL/PLATELET - Abnormal; Notable for the following:    RBC  3.58 (*)    Hemoglobin 11.0 (*)    HCT 32.6 (*)    All other components within normal limits  GRAM STAIN  BODY FLUID CULTURE    Imaging Review Dg Ankle Complete Right  12/10/2015  CLINICAL DATA:  65 year old female with lateral ankle swelling and pain for 1 month. EXAM: RIGHT ANKLE - COMPLETE 3+ VIEW COMPARISON:  Radiograph dated 11/18/2006 FINDINGS: A fixation plate and screws noted along the lateral aspect of the distal fibula and lateral malleolus similar to prior study. The most distal screw appears to have backed out approximately 6 mm (previously 4 mm). There is soft tissue swelling with possible small effusion adjacent to lateral malleolus and orthopedic hardware. Medial malleolar fixation screw remains in place. There is no acute fracture or dislocation. The bones are osteopenic. The ankle mortise is intact. IMPRESSION: Partial unscrewed appearance of the most distal lateral malleolar fixation screw with interval progression compared to the prior study. There is soft tissue swelling with small amount of the effusion adjacent to the hardware and over the lateral malleolus. Electronically Signed   By: Anner Crete M.D.   On: 12/10/2015 19:14   I have personally reviewed and evaluated these images and lab results as part of my medical decision-making.   EKG Interpretation None      MDM   Final diagnoses:  Ankle swelling, right   HOWARD PATTON is a 65 y.o. female status post right ankle surgery 10 years ago presents to ED with complaint of right ankle pain and swelling. Patient has hardware in right ankle and states screws have been coming loose. Patient is afebrile and nontoxic. Vital signs are stable. On physical exam right lateral malleolus has a 2 cm ulceration with surrounding warmth, erythema, and swelling. No discharge from ulceration. Range of motion is intact but with pain. Slight decrease in sensation. Slight decrease in strength secondary to pain. X-rays show progression of  hardware loosening as well as soft tissue swelling and effusion next to the hardware and over the lateral malleolus. We'll consult orthopedics obtain CBC and CMP. Discussed patient with Dr. Audie Pinto, who also saw patient, and agrees with plan.   CMP unremarkable. CBC remarkable for low hemoglobin; however, review of records indicate patient chronically has low hemoglobin.   Dr. Marlou Hamilton, orthopedics, agrees to see patient.  10:10 PM: Patient seen by Dr. Marlou Hamilton. Recommends aspiration of fluid pocket and sending fluid for culture and Gram stain.  10:45 PM: Dr. Marlou Hamilton aspirated serosanguineous fluid  from pocket. Fluid sent for culture and Gram stain. Patient placed on Keflex. Patient instructed to take Percocet for pain control. Patient to be seen by Dr. Lorin Mercy in office tomorrow. Patient voiced understanding and is agreeable with plan.  I personally performed the services described in this documentation, which was scribed in my presence. The recorded information has been reviewed and is accurate.      Amy Hamilton, [place] 12/10/15 5320  Leonard Schwartz, Amy Hamilton 12/12/15 [phone]

## 2015-12-10 NOTE — Discharge Instructions (Signed)
Read the information below.  Use the prescribed medication as directed.  Please discuss all new medications with your pharmacist.  Take full course of antibiotics even if feeling better. You can take Percocet for pain relief. Follow up in office with Dr. Lorin Mercy tomorrow. You may return to the Emergency Department at any time for worsening condition or any new symptoms that concern you.

## 2015-12-10 NOTE — ED Notes (Signed)
Dr. Blackman at bedside. 

## 2015-12-10 NOTE — ED Notes (Signed)
PA at bedside.

## 2015-12-10 NOTE — Consult Note (Signed)
Reason for Consult: Right ankle pain Referring Physician: Caryl Pina PA emergency room  Amy Hamilton is an 65 y.o. female.  HPI: Amy Hamilton is a 65 year old ambulatory patient with right ankle pain been going on for about a week she had ankle fracture fixation by Dr. Lorin Mercy in 2008 for bimalleolar ankle fracture. This was a closed injury. She noticed hardware prominence about 2 years ago. The hardware prominence has worsened and became more acute tonight in his then aggravated and has been accelerating in terms of pain and swelling along the lateral malleolus she did have a fever to 102 last night.  Past Medical History  Diagnosis Date  . Thyroid disease   . Depression   . Uterine prolapse   . DDD (degenerative disc disease), cervical   . Chronic back pain     Past Surgical History  Procedure Laterality Date  . Femur im nail Left 10/18/2012    Procedure: INTRAMEDULLARY (IM) NAIL FEMORAL;  Surgeon: Meredith Pel, MD;  Location: [facility];  Service: Orthopedics;  Laterality: Left;  . Abdominal hysterectomy    . Back surgery    . Orif ulnar fracture Left 01/04/2015    Procedure: OPEN REDUCTION INTERNAL FIXATION (ORIF) ULNAR FRACTURE;  Surgeon: Milly Jakob, MD;  Location: [facility];  Service: Orthopedics;  Laterality: Left;  . Carpal tunnel release Left 01/06/2015    Procedure: CARPAL TUNNEL RELEASE;  Surgeon: Milly Jakob, MD;  Location: [facility];  Service: Orthopedics;  Laterality: Left;  . I&d extremity Left 01/06/2015    Procedure: IRRIGATION AND DEBRIDEMENT EXTREMITY, EVACUATION HEMATOMA;  Surgeon: Milly Jakob, MD;  Location: [facility];  Service: Orthopedics;  Laterality: Left;  . Application of wound vac Left 01/06/2015    Procedure: APPLICATION OF WOUND VAC;  Surgeon: Milly Jakob, MD;  Location: [facility];  Service: Orthopedics;  Laterality: Left;  . Secondary closure of wound Left 01/15/2015    Procedure: SECONDARY CLOSURE OF WOUND LEFT FOREARM;  Surgeon: Milly Jakob, MD;  Location: [facility];  Service: Orthopedics;  Laterality: Left;  . Ankle surgery Right     No family history on file.  Social History:  reports that she has been smoking Cigarettes.  She has been smoking about 0.50 packs per day. She does not have any smokeless tobacco history on file. She reports that she does not drink alcohol or use illicit drugs.  Allergies: No Known Allergies  Medications: I have reviewed the patient's current medications.  Results for orders placed or performed during the hospital encounter of 12/10/15 (from the past 48 hour(s))  Comprehensive metabolic panel     Status: Abnormal   Collection Time: 12/10/15  8:18 PM  Result Value Ref Range   Sodium 137 135 - 145 mmol/L   Potassium 4.6 3.5 - 5.1 mmol/L   Chloride 99 (L) 101 - 111 mmol/L   CO2 29 22 - 32 mmol/L   Glucose, Bld 98 65 - 99 mg/dL   BUN 9 6 - 20 mg/dL   Creatinine, Ser 0.67 0.44 - 1.00 mg/dL   Calcium 9.5 8.9 - 10.3 mg/dL   Total Protein 6.5 6.5 - 8.1 g/dL   Albumin 3.5 3.5 - 5.0 g/dL   AST 25 15 - 41 U/L   ALT 17 14 - 54 U/L   Alkaline Phosphatase 67 38 - 126 U/L   Total Bilirubin 0.4 0.3 - 1.2 mg/dL   GFR calc non Af Amer >60 >60 mL/min   GFR calc Af Amer >60 >60  mL/min    Comment: (NOTE) The eGFR has been calculated using the CKD EPI equation. This calculation has not been validated in all clinical situations. eGFR's persistently <60 mL/min signify possible Chronic Kidney Disease.    Anion gap 9 5 - 15  CBC with Differential     Status: Abnormal   Collection Time: 12/10/15  8:18 PM  Result Value Ref Range   WBC 7.4 4.0 - 10.5 K/uL   RBC 3.58 (L) 3.87 - 5.11 MIL/uL   Hemoglobin 11.0 (L) 12.0 - 15.0 g/dL   HCT 32.6 (L) 36.0 - 46.0 %   MCV 91.1 78.0 - 100.0 fL   MCH 30.7 26.0 - 34.0 pg   MCHC 33.7 30.0 - 36.0 g/dL   RDW 12.8 11.5 - 15.5 %   Platelets 318 150 - 400 K/uL   Neutrophils Relative % 54 %   Neutro Abs 3.9 1.7 - 7.7 K/uL   Lymphocytes Relative 34 %   Lymphs Abs 2.6 0.7 - 4.0  K/uL   Monocytes Relative 7 %   Monocytes Absolute 0.6 0.1 - 1.0 K/uL   Eosinophils Relative 5 %   Eosinophils Absolute 0.4 0.0 - 0.7 K/uL   Basophils Relative 0 %   Basophils Absolute 0.0 0.0 - 0.1 K/uL    Dg Ankle Complete Right  12/10/2015  CLINICAL DATA:  65 year old female with lateral ankle swelling and pain for 1 month. EXAM: RIGHT ANKLE - COMPLETE 3+ VIEW COMPARISON:  Radiograph dated 11/18/2006 FINDINGS: A fixation plate and screws noted along the lateral aspect of the distal fibula and lateral malleolus similar to prior study. The most distal screw appears to have backed out approximately 6 mm (previously 4 mm). There is soft tissue swelling with possible small effusion adjacent to lateral malleolus and orthopedic hardware. Medial malleolar fixation screw remains in place. There is no acute fracture or dislocation. The bones are osteopenic. The ankle mortise is intact. IMPRESSION: Partial unscrewed appearance of the most distal lateral malleolar fixation screw with interval progression compared to the prior study. There is soft tissue swelling with small amount of the effusion adjacent to the hardware and over the lateral malleolus. Electronically Signed   By: Anner Crete M.D.   On: 12/10/2015 19:14    Review of Systems  Constitutional: Positive for fever.  HENT: Negative.   Eyes: Negative.   Respiratory: Negative.   Cardiovascular: Negative.   Gastrointestinal: Negative.   Genitourinary: Negative.   Musculoskeletal: Positive for joint pain.  Skin: Negative.   Neurological: Negative.   Endo/Heme/Allergies: Negative.   Psychiatric/Behavioral: Negative.    Blood pressure 116/76, pulse 65, temperature 98.4 F (36.9 C), temperature source Oral, resp. rate 18, height _0  (1.6 m), weight 52.164 kg (115 lb), SpO2 98 %. Physical Exam  Constitutional: She appears well-developed.  HENT:  Head: Normocephalic.  Eyes: Pupils are equal, round, and reactive to light.  Neck: Normal  range of motion.  Cardiovascular: Normal rate.   Respiratory: Effort normal.  Neurological: She is alert.  Skin: Skin is warm.  Psychiatric: She has a normal mood and affect.   right ankle is examined no medial sided tenderness present. Pulses palpable compartment soft ankle dorsiflexion and plantar flexion is intact there is no ankle effusion there is some tenderness and swelling on the lateral malleolus. Rim rim of erythema is present about 1/2 cm round punctate small ulceration. This does not probe to bone and there is no exposed hardware there is fluid pocket beneath the skin  Assessment/Plan:   Study ankle show healed fracture but there is packing out of the most distal screw which is a nonlocking screw along the distal lateral malleolus. Impression loose hardware and no evidence of infection but there is a lot of edema and fluid pocket and some redness around the distal aspect of the incision plan tonight this area is aspirated about 5 mm away from this rim of erythema. Did get about 2 mL of sanguinous fluid which is sent to the laboratory for Gram stain and culture analysis. We'll start her on Keflex empirically as well as pain medicine and have her see Dr. Lorin Mercy in the morning she will need to have hardware removal next week Allis is explained to the patient Ace wrap compression applied elevation encouraged  Mailyn Steichen SCOTT 12/10/2015, 10:57 PM

## 2015-12-10 NOTE — ED Notes (Signed)
Pt at RN station states she has to go home an feed her dogs; pt states she is unable to stay any longer; PA currently speaking with consulting Md; Pt agreed to returned to room until PA advises of consulting Md's orders.

## 2015-12-10 NOTE — ED Notes (Signed)
Consent obtained and at bedside

## 2015-12-14 LAB — BODY FLUID CULTURE
Culture: NO GROWTH
SPECIAL REQUESTS: NORMAL

## 2016-01-18 ENCOUNTER — Other Ambulatory Visit: Payer: Self-pay | Admitting: Pediatric Cardiology

## 2016-02-02 ENCOUNTER — Ambulatory Visit (INDEPENDENT_AMBULATORY_CARE_PROVIDER_SITE_OTHER): Payer: Medicaid Other | Admitting: Infectious Disease

## 2016-02-02 ENCOUNTER — Encounter: Payer: Self-pay | Admitting: Infectious Disease

## 2016-02-02 ENCOUNTER — Telehealth: Payer: Self-pay

## 2016-02-02 VITALS — BP 146/92 | HR 74 | Temp 98.1°F | Wt 113.0 lb

## 2016-02-02 DIAGNOSIS — T847XXA Infection and inflammatory reaction due to other internal orthopedic prosthetic devices, implants and grafts, initial encounter: Secondary | ICD-10-CM

## 2016-02-02 DIAGNOSIS — M869 Osteomyelitis, unspecified: Secondary | ICD-10-CM

## 2016-02-02 DIAGNOSIS — Z113 Encounter for screening for infections with a predominantly sexual mode of transmission: Secondary | ICD-10-CM | POA: Diagnosis not present

## 2016-02-02 HISTORY — DX: Infection and inflammatory reaction due to other internal orthopedic prosthetic devices, implants and grafts, initial encounter: T84.7XXA

## 2016-02-02 HISTORY — DX: Encounter for screening for infections with a predominantly sexual mode of transmission: Z11.3

## 2016-02-02 HISTORY — DX: Osteomyelitis, unspecified: M86.9

## 2016-02-02 LAB — CBC WITH DIFFERENTIAL/PLATELET
Basophils Absolute: 0 cells/uL (ref 0–200)
Basophils Relative: 0 %
Eosinophils Absolute: 228 cells/uL (ref 15–500)
Eosinophils Relative: 4 %
HEMATOCRIT: 35.2 % (ref 35.0–45.0)
HEMOGLOBIN: 11.6 g/dL — AB (ref 11.7–15.5)
LYMPHS ABS: 2280 {cells}/uL (ref 850–3900)
Lymphocytes Relative: 40 %
MCH: 30.9 pg (ref 27.0–33.0)
MCHC: 33 g/dL (ref 32.0–36.0)
MCV: 93.6 fL (ref 80.0–100.0)
MONO ABS: 570 {cells}/uL (ref 200–950)
MPV: 9 fL (ref 7.5–12.5)
Monocytes Relative: 10 %
NEUTROS ABS: 2622 {cells}/uL (ref 1500–7800)
NEUTROS PCT: 46 %
Platelets: 377 10*3/uL (ref 140–400)
RBC: 3.76 MIL/uL — AB (ref 3.80–5.10)
RDW: 13.6 % (ref 11.0–15.0)
WBC: 5.7 10*3/uL (ref 3.8–10.8)

## 2016-02-02 NOTE — Telephone Encounter (Signed)
Called and left a voicemail for [place] in IR to schedule an appointment for PICC line to be placed per Dr. Tommy Medal order. Rodman Key, LPN

## 2016-02-02 NOTE — Progress Notes (Signed)
Reason for Consult: Hardware complicating osteomyelitis of ankle  Requesting Physician/: Dr. Marlou Sa   Subjective:    Patient ID: Amy Hamilton, female    DOB: October 09, 1950, 65 y.o.   MRN: [id number]  HPI  65 year old with thyroid disease, smoking and likely osteoporosis who has multiple fractures over the years requring surgery including right ankle hardware for malleolar fracture years ago who recently began noticing the screws irritating the skin and in fact coming out of the skin. She was seen in May by Dr. Marlou Sa who aspirated the area and sent fluid for culture which failed to grow any organism. She had been given keflex at that time. Over time the screws loosened further on plain film and palpable by the patient. Dr. Marlou Sa decided that there was concern for deep infection and took her to the OR for Hardware removal on June 26th. I do not have a copy of the operative report but the patient told me that he found the bone to be very soft. The bone was clearly sent to pathology as I have a path report showing Osteomyelitis.  I DO NOT have ANY cultures though the patient states that Dr. Marlou Sa took multiple cultures. My RN called his office to inquire about cultures and they stated that none were done. I will get in touch with Dr. Marlou Sa himself to get further clarity because perhaps some cultures were done from [place] but I do not possess them and apparently Dr. Randel Pigg office staff were unaware of them.  Past Medical History  Diagnosis Date  . Thyroid disease   . Depression   . Uterine prolapse   . DDD (degenerative disc disease), cervical   . Chronic back pain   . Hardware complicating wound infection ([facility]) 02/02/2016  . Osteomyelitis, 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[place]) 02/02/2016    Past Surgical History  Procedure Laterality Date  . Femur im nail Left 10/18/2012    Procedure: INTRAMEDULLARY (IM) NAIL FEMORAL;  Surgeon: Meredith Pel, MD;  Location: [facility];  Service: Orthopedics;  Laterality:  Left;  . Abdominal hysterectomy    . Back surgery    . Orif ulnar fracture Left 01/04/2015    Procedure: OPEN REDUCTION INTERNAL FIXATION (ORIF) ULNAR FRACTURE;  Surgeon: Milly Jakob, MD;  Location: [facility];  Service: Orthopedics;  Laterality: Left;  . Carpal tunnel release Left 01/06/2015    Procedure: CARPAL TUNNEL RELEASE;  Surgeon: Milly Jakob, MD;  Location: [facility];  Service: Orthopedics;  Laterality: Left;  . I&d extremity Left 01/06/2015    Procedure: IRRIGATION AND DEBRIDEMENT EXTREMITY, EVACUATION HEMATOMA;  Surgeon: Milly Jakob, MD;  Location: [facility];  Service: Orthopedics;  Laterality: Left;  . Application of wound vac Left 01/06/2015    Procedure: APPLICATION OF WOUND VAC;  Surgeon: Milly Jakob, MD;  Location: [facility];  Service: Orthopedics;  Laterality: Left;  . Secondary closure of wound Left 01/15/2015    Procedure: SECONDARY CLOSURE OF WOUND LEFT FOREARM;  Surgeon: Milly Jakob, MD;  Location: [facility];  Service: Orthopedics;  Laterality: Left;  . Ankle surgery Right     No family history on file.    Social History   Social History  . Marital Status: Married    Spouse Name: N/A  . Number of Children: N/A  . Years of Education: N/A   Social History Main Topics  . Smoking status: Current Every Day Smoker -- 0.50 packs/day    Types: Cigarettes  . Smokeless tobacco: None  .  Alcohol Use: No  . Drug Use: No  . Sexual Activity: Not Asked   Other Topics Concern  . None   Social History Narrative    No Known Allergies   Current outpatient prescriptions:  .  albuterol (PROVENTIL HFA;VENTOLIN HFA) 108 (90 BASE) MCG/ACT inhaler, Inhale 1-2 puffs into the lungs every 6 (six) hours as needed for wheezing or shortness of breath. (Patient taking differently: Inhale 2 puffs into the lungs every 6 (six) hours as needed for wheezing or shortness of breath. ), Disp: 1 Inhaler, Rfl: 0 .  aspirin EC 81 MG EC tablet, Take 1 tablet (81 mg total) by mouth  daily., Disp: 30 tablet, Rfl: 0 .  Aspirin-Acetaminophen-Caffeine (GOODY HEADACHE PO), Take 1 packet by mouth 2 (two) times daily as needed (pain). , Disp: , Rfl:  .  doxycycline (VIBRA-TABS) 100 MG tablet, Take 100 mg by mouth 2 (two) times daily., Disp: , Rfl: 0 .  ESTRADIOL PO, Take 1 tablet by mouth daily., Disp: , Rfl:  .  FLUoxetine (PROZAC) 40 MG capsule, Take 2 capsules (80 mg total) by mouth daily., Disp: 60 capsule, Rfl: 1 .  fluticasone (FLONASE) 50 MCG/ACT nasal spray, Place 2 sprays into both nostrils daily as needed for allergies or rhinitis., Disp: , Rfl:  .  levothyroxine (SYNTHROID, LEVOTHROID) 88 MCG tablet, Take 88 mcg by mouth daily before breakfast., Disp: , Rfl:  .  oxyCODONE-acetaminophen (PERCOCET/ROXICET) 5-325 MG per tablet, Take 1-2 tablets by mouth every 6 (six) hours as needed for moderate pain or severe pain., Disp: 60 tablet, Rfl: 0 .  BEE POLLEN PO, Take 2 tablets by mouth daily. Reported on 02/02/2016, Disp: , Rfl:  .  HYDROcodone-acetaminophen (NORCO) 7.5-325 MG per tablet, Take 1-2 tablets by mouth every 4 (four) hours as needed for moderate pain. Reported on 02/02/2016, Disp: , Rfl:  .  hydrOXYzine (ATARAX/VISTARIL) 25 MG tablet, Take 25 mg by mouth every 6 (six) hours as needed for anxiety. Reported on 02/02/2016, Disp: , Rfl:  .  VITAMIN E PO, Take 1 capsule by mouth daily. Reported on 02/02/2016, Disp: , Rfl:     Review of Systems  Constitutional: Negative for fever, chills, diaphoresis, activity change, appetite change, fatigue and unexpected weight change.  HENT: Negative for congestion, rhinorrhea, sinus pressure, sneezing, sore throat and trouble swallowing.   Eyes: Negative for photophobia and visual disturbance.  Respiratory: Negative for cough, chest tightness, shortness of breath, wheezing and stridor.   Cardiovascular: Negative for chest pain, palpitations and leg swelling.  Gastrointestinal: Negative for nausea, vomiting, abdominal pain, diarrhea,  constipation, blood in stool, abdominal distention and anal bleeding.  Genitourinary: Negative for dysuria, hematuria, flank pain and difficulty urinating.  Musculoskeletal: Positive for arthralgias. Negative for myalgias, back pain, joint swelling and gait problem.  Skin: Positive for wound. Negative for color change, pallor and rash.  Neurological: Negative for dizziness, tremors, weakness and light-headedness.  Hematological: Negative for adenopathy. Does not bruise/bleed easily.  Psychiatric/Behavioral: Negative for behavioral problems, confusion, sleep disturbance, dysphoric mood, decreased concentration and agitation.       Objective:   Physical Exam  Constitutional: She is oriented to person, place, and time. She appears well-developed and well-nourished. No distress.  HENT:  Head: Normocephalic and atraumatic.  Mouth/Throat: No oropharyngeal exudate.  Eyes: Conjunctivae and EOM are normal. No scleral icterus.  Neck: Normal range of motion. Neck supple.  Cardiovascular: Normal rate and regular rhythm.   Pulmonary/Chest: Effort normal. No respiratory distress. She has no wheezes.  Abdominal: She exhibits no distension.  Musculoskeletal: She exhibits no edema or tenderness.  Neurological: She is alert and oriented to person, place, and time. She exhibits normal muscle tone. Coordination normal.  Skin: Skin is warm and dry. No rash noted. She is not diaphoretic. No erythema.  Psychiatric: She has a normal mood and affect. Her behavior is normal. Judgment and thought content normal.    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:  --I would like to ensure clarity on whether or not we DO or DO NOT have cultures that are POSITIVE that could help US guide pathogen specific antibiotiics  --IF NOT then we will go with vancomycin and ceftriaxone to cover the "usual suspects such as " MRSA, MSSA, coag neg  Staph, Streptococci  We will plan on treating for 6-8 weeks IV abx and possibly followed by oral antibiotics given location of this infection  I would also like to get ABI to check out her blood flow here  Smoker: emphasized need to stop smoking  Screening: check HIV, HCV  We spent greater than 80 minutes with the patient including greater than 50% of time in face to face counsel of the patient re hardware complicated osteomyelitis, smoking and in coordination of her care.

## 2016-02-03 ENCOUNTER — Telehealth: Payer: Self-pay

## 2016-02-03 LAB — COMPLETE METABOLIC PANEL WITH GFR
ALT: 13 U/L (ref 6–29)
AST: 19 U/L (ref 10–35)
Albumin: 3.8 g/dL (ref 3.6–5.1)
Alkaline Phosphatase: 69 U/L (ref 33–130)
BUN: 9 mg/dL (ref 7–25)
CO2: 25 mmol/L (ref 20–31)
Calcium: 9 mg/dL (ref 8.6–10.4)
Chloride: 98 mmol/L (ref 98–110)
Creat: 0.62 mg/dL (ref 0.50–0.99)
GFR, Est African American: >89 mL/min (ref >=60)
GFR, Est Non African American: >89 mL/min (ref >=60)
Glucose, Bld: 85 mg/dL (ref 65–99)
Potassium: 3.5 mmol/L (ref 3.5–5.3)
SODIUM: 130 mmol/L — AB (ref 135–146)
Total Bilirubin: 0.4 mg/dL (ref 0.2–1.2)
Total Protein: 6.2 g/dL (ref 6.1–8.1)

## 2016-02-03 LAB — C-REACTIVE PROTEIN: CRP: <0.5 mg/dL (ref <0.60)

## 2016-02-03 LAB — HIV ANTIBODY (ROUTINE TESTING W REFLEX): HIV 1&2 Ab, 4th Generation: NONREACTIVE

## 2016-02-03 LAB — SEDIMENTATION RATE: Sed Rate: 1 mm/hr (ref 0–30)

## 2016-02-03 NOTE — Telephone Encounter (Signed)
Called [facility] in IR scheduling again and left voice message to return call for patient to have a PICC line placed per Dr. Tommy Medal order. Will continue to follow. Rodman Key, LPN

## 2016-02-05 NOTE — Telephone Encounter (Signed)
Faxed Dr. Tommy Medal orders for Vancomycin and Ceftriaxone and weekly lab to [facility]. Fax returned OK status. Rodman Key, LPN

## 2016-02-08 ENCOUNTER — Telehealth: Payer: Self-pay

## 2016-02-08 NOTE — Telephone Encounter (Signed)
Faxed Dr. Tommy Medal physician  first dose orders to [facility] stay. Fax returned OK status. Rodman Key, LPN

## 2016-02-09 ENCOUNTER — Ambulatory Visit (HOSPITAL_COMMUNITY)
Admission: RE | Admit: 2016-02-09 | Discharge: 2016-02-09 | Disposition: A | Discharge status: Home / Self Care | Payer: Medicaid Other | Source: Ambulatory Visit | Attending: Infectious Disease | Admitting: Infectious Disease

## 2016-02-09 ENCOUNTER — Encounter (HOSPITAL_COMMUNITY)
Admission: RE | Admit: 2016-02-09 | Discharge: 2016-02-09 | Disposition: A | Discharge status: Home / Self Care | Payer: Medicaid Other | Source: Ambulatory Visit | Attending: Infectious Diseases | Admitting: Infectious Diseases

## 2016-02-09 ENCOUNTER — Other Ambulatory Visit (HOSPITAL_COMMUNITY): Payer: Self-pay | Admitting: *Deleted

## 2016-02-09 ENCOUNTER — Other Ambulatory Visit: Payer: Self-pay | Admitting: Infectious Disease

## 2016-02-09 DIAGNOSIS — Y793 Surgical instruments, materials and orthopedic devices (including sutures) associated with adverse incidents: Secondary | ICD-10-CM | POA: Insufficient documentation

## 2016-02-09 DIAGNOSIS — M869 Osteomyelitis, unspecified: Secondary | ICD-10-CM

## 2016-02-09 DIAGNOSIS — T847XXA Infection and inflammatory reaction due to other internal orthopedic prosthetic devices, implants and grafts, initial encounter: Secondary | ICD-10-CM

## 2016-02-09 DIAGNOSIS — M868X7 Other osteomyelitis, ankle and foot: Secondary | ICD-10-CM | POA: Diagnosis not present

## 2016-02-09 MED ORDER — VANCOMYCIN HCL IN DEXTROSE 1-5 GM/200ML-% IV SOLN
INTRAVENOUS | Status: AC
  Administered 2016-02-09: 1000 mg via INTRAVENOUS
  Filled 2016-02-09: qty 200

## 2016-02-09 MED ORDER — VANCOMYCIN HCL IN DEXTROSE 1-5 GM/200ML-% IV SOLN
1000.0000 mg | INTRAVENOUS | Status: AC
  Administered 2016-02-09: 1000 mg via INTRAVENOUS

## 2016-02-09 MED ORDER — LIDOCAINE HCL 1 % IJ SOLN
INTRAMUSCULAR | Status: AC
  Filled 2016-02-09: qty 20

## 2016-02-09 MED ORDER — HEPARIN SOD (PORK) LOCK FLUSH 100 UNIT/ML IV SOLN
INTRAVENOUS | Status: AC
  Administered 2016-02-09: 250 [IU]
  Filled 2016-02-09: qty 5

## 2016-02-09 MED ORDER — HEPARIN SOD (PORK) LOCK FLUSH 100 UNIT/ML IV SOLN
250.0000 [IU] | INTRAVENOUS | Status: AC | PRN
  Administered 2016-02-09: 250 [IU]

## 2016-02-09 MED ORDER — LIDOCAINE HCL 1 % IJ SOLN
INTRAMUSCULAR | Status: DC | PRN
  Administered 2016-02-09: 5 mL

## 2016-02-09 MED ORDER — DEXTROSE 5 % IV SOLN
2.0000 g | Freq: Once | INTRAVENOUS | Status: AC
  Administered 2016-02-09: 2 g via INTRAVENOUS
  Filled 2016-02-09: qty 2

## 2016-02-09 NOTE — Procedures (Signed)
Interventional Radiology Procedure Note  Procedure: Placement of a right basilic vein PowerPicc.  41cm, SL.  Tip is positioned at the superior cavoatrial junction and catheter is ready for immediate use.  Complications: None Recommendations:  - Ok to use - Do not submerge for 7 days - Routine line care   Signed,  Dulcy Fanny. Earleen Newport, DO

## 2016-02-12 ENCOUNTER — Telehealth: Payer: Self-pay

## 2016-02-12 NOTE — Telephone Encounter (Signed)
-----   Message from Truman Hayward, MD sent at 02/10/2016  6:59 PM EDT ----- Amy Hamilton can you make sure  [facility] has started her IV antibiotics now?

## 2016-02-12 NOTE — Telephone Encounter (Signed)
[place] and spoke with Debbie the pharmacy manager that verified patient is on antibiotic therapy of Vancomycin and Ceftriaxone. Rodman Key, LPN

## 2016-02-17 ENCOUNTER — Other Ambulatory Visit: Payer: Self-pay | Admitting: Pharmacist

## 2016-02-25 ENCOUNTER — Telehealth: Payer: Self-pay | Admitting: *Deleted

## 2016-02-25 NOTE — Telephone Encounter (Signed)
Home visit today - Ankle sl. edema, pain "8" revisit 02/28/16.  Patient wanted Dr. Tommy Medal to know.  The patient has a return appt scheduled for 03/21/16 with Dr. Tommy Medal.

## 2016-02-27 ENCOUNTER — Telehealth: Payer: Self-pay | Admitting: Internal Medicine

## 2016-02-27 ENCOUNTER — Encounter: Payer: Self-pay | Admitting: Internal Medicine

## 2016-02-27 NOTE — Telephone Encounter (Signed)
I received a phone call from a family member of Amy Hamilton yesterday afternoon. She informed me that Amy Hamilton had developed a "knot" on her left forearm that was slightly sore. She has not had any trauma. She's had no IV sticks at that site. She has been getting IV vancomycin and ceftriaxone via a right arm PICC for right ankle osteomyelitis. I told her that I could not say for sure what was causing the knot but that I doubted it was related to her pick in her opposite arm, her antibiotics or the ankle infection. I suggested that she check with her primary care provider in [place] and/or have her home nurse look at the area when she comes tomorrow.

## 2016-03-21 ENCOUNTER — Ambulatory Visit: Payer: Medicaid Other | Admitting: Infectious Disease

## 2016-03-31 ENCOUNTER — Telehealth: Payer: Self-pay

## 2016-03-31 NOTE — Telephone Encounter (Signed)
[facility] nurse Varney Biles called because she was told by patient she needed to call in order for patient to get an appointment. Appointment already in system for 09/18. Nurse Pricilla Riffle states patient PICC line flushes and is in working condition. LPN called patient to verify need. Voicemail picked up. Left message for patient to call office. Called nurse Varney Biles back to let her know LPN was not able to reach patient. Rodman Key, LPN

## 2016-03-31 NOTE — Telephone Encounter (Signed)
Called [name] at [facility] to give Dr. Derek Mound verbal order to continue antibiotic until Sept 18th. Patient has an clinic appointment on Sept 18th. [name] read back order and stated understanding. Rodman Key, LPN

## 2016-03-31 NOTE — Telephone Encounter (Signed)
Patient returned phone call to clinic Stated she had already made follow up appointment. Notified patient of Dr. Tommy Medal verbal order to continue antibiotic until appointment on 09/18. Stressed the importance of keeping appointment so Dr. Tommy Medal can make a determination how to proceed with care. Patient stated understanding and that she will keep appointment. Rodman Key, LPN

## 2016-04-06 ENCOUNTER — Encounter: Payer: Self-pay | Admitting: Infectious Disease

## 2016-04-07 ENCOUNTER — Encounter: Payer: Self-pay | Admitting: Infectious Disease

## 2016-04-11 ENCOUNTER — Encounter: Payer: Self-pay | Admitting: Infectious Disease

## 2016-04-11 ENCOUNTER — Telehealth: Payer: Self-pay | Admitting: *Deleted

## 2016-04-11 ENCOUNTER — Ambulatory Visit (INDEPENDENT_AMBULATORY_CARE_PROVIDER_SITE_OTHER): Payer: Medicaid Other | Admitting: Infectious Disease

## 2016-04-11 VITALS — BP 176/82 | HR 71 | Temp 97.9°F | Wt 112.0 lb

## 2016-04-11 DIAGNOSIS — T847XXD Infection and inflammatory reaction due to other internal orthopedic prosthetic devices, implants and grafts, subsequent encounter: Secondary | ICD-10-CM | POA: Diagnosis not present

## 2016-04-11 DIAGNOSIS — Z720 Tobacco use: Secondary | ICD-10-CM

## 2016-04-11 DIAGNOSIS — M869 Osteomyelitis, unspecified: Secondary | ICD-10-CM

## 2016-04-11 DIAGNOSIS — Z967 Presence of other bone and tendon implants: Secondary | ICD-10-CM | POA: Diagnosis not present

## 2016-04-11 DIAGNOSIS — Z23 Encounter for immunization: Secondary | ICD-10-CM

## 2016-04-11 DIAGNOSIS — Z9889 Other specified postprocedural states: Secondary | ICD-10-CM

## 2016-04-11 DIAGNOSIS — F17200 Nicotine dependence, unspecified, uncomplicated: Secondary | ICD-10-CM

## 2016-04-11 DIAGNOSIS — Z8781 Personal history of (healed) traumatic fracture: Secondary | ICD-10-CM | POA: Diagnosis not present

## 2016-04-11 MED ORDER — SULFAMETHOXAZOLE-TRIMETHOPRIM 800-160 MG PO TABS: 1.0000 | ORAL_TABLET | Freq: Two times a day (BID) | ORAL | 3 refills | Status: DC

## 2016-04-11 NOTE — Progress Notes (Signed)
Chief complaint: followup for hardware related osteo and lesion inbetween her toes.   Subjective:    Patient ID: Amy Hamilton, female    DOB: 11-03-1950, 65 y.o.   MRN: [id number]  HPI  65 year old with thyroid disease, smoking and likely osteoporosis who has multiple fractures over the years requring surgery including right ankle hardware for malleolar fracture years ago who recently began noticing the screws irritating the skin and in fact coming out of the skin. She was seen in May by Dr. Marlou Sa who aspirated the area and sent fluid for culture which failed to grow any organism. She had been given keflex at that time. Over time the screws loosened further on plain film and palpable by the patient. Dr. Marlou Sa decided that there was concern for deep infection and took her to the OR for Hardware removal on June 26th. I do not have a copy of the operative report but the patient told me that he found the bone to be very soft. The bone was clearly sent to pathology as I have a path report showing Osteomyelitis.  I DO NOT have ANY cultures though the patient states that Dr. Marlou Sa took multiple cultures but it turns out that there were no cultures so we went ahead with empiric therapy and treated her with Vancomycin and rocephin.  Her ESR and CRP were normal BEFORE starting therapy and have continued to be so with labs via [facility] though her QUANT CRP was up at times we never did baseline QUANT CRP  She states that pain in her ankle has dramatically improved but still does occur with walking. She also does have pain in her left hip where she has hardware (that is apparently loosening) and she is being evaluated by Orthopedics for this.  She has been on IV abx for > 2 months.   Past Medical History:  Diagnosis Date  . Chronic back pain   . DDD (degenerative disc disease), cervical   . Depression   . Hardware complicating wound infection ([place]) 02/02/2016  . Osteomyelitis, 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[place]) 02/02/2016    . Routine screening for STI (sexually transmitted infection) 02/02/2016  . Thyroid disease   . Uterine prolapse     Past Surgical History:  Procedure Laterality Date  . ABDOMINAL HYSTERECTOMY    . ANKLE SURGERY Right   . APPLICATION OF WOUND VAC Left 01/06/2015   Procedure: APPLICATION OF WOUND VAC;  Surgeon: Milly Jakob, MD;  Location: [place];  Service: Orthopedics;  Laterality: Left;  . BACK SURGERY    . CARPAL TUNNEL RELEASE Left 01/06/2015   Procedure: CARPAL TUNNEL RELEASE;  Surgeon: Milly Jakob, MD;  Location: [place];  Service: Orthopedics;  Laterality: Left;  . FEMUR IM NAIL Left 10/18/2012   Procedure: INTRAMEDULLARY (IM) NAIL FEMORAL;  Surgeon: Meredith Pel, MD;  Location: [place];  Service: Orthopedics;  Laterality: Left;  . I&D EXTREMITY Left 01/06/2015   Procedure: IRRIGATION AND DEBRIDEMENT EXTREMITY, EVACUATION HEMATOMA;  Surgeon: Milly Jakob, MD;  Location: [place];  Service: Orthopedics;  Laterality: Left;  . ORIF ULNAR FRACTURE Left 01/04/2015   Procedure: OPEN REDUCTION INTERNAL FIXATION (ORIF) ULNAR FRACTURE;  Surgeon: Milly Jakob, MD;  Location: [place];  Service: Orthopedics;  Laterality: Left;  . SECONDARY CLOSURE OF WOUND Left 01/15/2015   Procedure: SECONDARY CLOSURE OF WOUND LEFT FOREARM;  Surgeon: Milly Jakob, MD;  Location: [place];  Service: Orthopedics;  Laterality: Left;    No family history  on file.    Social History   Social History  . Marital status: Married    Spouse name: N/A  . Number of children: N/A  . Years of education: N/A   Social History Main Topics  . Smoking status: Current Every Day Smoker    Packs/day: 0.50    Types: Cigarettes  . Smokeless tobacco: None  . Alcohol use No  . Drug use: No  . Sexual activity: Not Asked   Other Topics Concern  . None   Social History Narrative  . None    No Known Allergies   Current Outpatient Prescriptions:  .  albuterol (PROVENTIL HFA;VENTOLIN HFA) 108 (90  BASE) MCG/ACT inhaler, Inhale 1-2 puffs into the lungs every 6 (six) hours as needed for wheezing or shortness of breath. (Patient taking differently: Inhale 2 puffs into the lungs every 6 (six) hours as needed for wheezing or shortness of breath. ), Disp: 1 Inhaler, Rfl: 0 .  aspirin EC 81 MG EC tablet, Take 1 tablet (81 mg total) by mouth daily., Disp: 30 tablet, Rfl: 0 .  Aspirin-Acetaminophen-Caffeine (GOODY HEADACHE PO), Take 1 packet by mouth 2 (two) times daily as needed (pain). , Disp: , Rfl:  .  BEE POLLEN PO, Take 2 tablets by mouth daily. Reported on 02/02/2016, Disp: , Rfl:  .  doxycycline (VIBRA-TABS) 100 MG tablet, Take 100 mg by mouth 2 (two) times daily., Disp: , Rfl: 0 .  ESTRADIOL PO, Take 1 tablet by mouth daily., Disp: , Rfl:  .  FLUoxetine (PROZAC) 40 MG capsule, Take 2 capsules (80 mg total) by mouth daily., Disp: 60 capsule, Rfl: 1 .  fluticasone (FLONASE) 50 MCG/ACT nasal spray, Place 2 sprays into both nostrils daily as needed for allergies or rhinitis., Disp: , Rfl:  .  hydrOXYzine (ATARAX/VISTARIL) 25 MG tablet, Take 25 mg by mouth every 6 (six) hours as needed for anxiety. Reported on 02/02/2016, Disp: , Rfl:  .  levothyroxine (SYNTHROID, LEVOTHROID) 88 MCG tablet, Take 88 mcg by mouth daily before breakfast., Disp: , Rfl:  .  VITAMIN E PO, Take 1 capsule by mouth daily. Reported on 02/02/2016, Disp: , Rfl:     Review of Systems  Constitutional: Negative for activity change, appetite change, chills, diaphoresis, fatigue, fever and unexpected weight change.  HENT: Negative for congestion, rhinorrhea, sinus pressure, sneezing, sore throat and trouble swallowing.   Eyes: Negative for photophobia and visual disturbance.  Respiratory: Negative for cough, chest tightness, shortness of breath, wheezing and stridor.   Cardiovascular: Negative for chest pain, palpitations and leg swelling.  Gastrointestinal: Negative for abdominal distention, abdominal pain, anal bleeding, blood  in stool, constipation, diarrhea, nausea and vomiting.  Genitourinary: Negative for difficulty urinating, dysuria, flank pain and hematuria.  Musculoskeletal: Positive for arthralgias. Negative for back pain, gait problem, joint swelling and myalgias.  Skin: Positive for wound. Negative for color change, pallor and rash.  Neurological: Negative for dizziness, tremors, weakness and light-headedness.  Hematological: Negative for adenopathy. Does not bruise/bleed easily.  Psychiatric/Behavioral: Negative for agitation, behavioral problems, confusion, decreased concentration, dysphoric mood and sleep disturbance.       Objective:   Physical Exam  Constitutional: She is oriented to person, place, and time. She appears well-developed and well-nourished. No distress.  HENT:  Head: Normocephalic and atraumatic.  Mouth/Throat: No oropharyngeal exudate.  Eyes: Conjunctivae and EOM are normal. No scleral icterus.  Neck: Normal range of motion. Neck supple.  Cardiovascular: Normal rate and regular rhythm.   Pulmonary/Chest: Effort  normal. No respiratory distress. She has no wheezes.  Abdominal: She exhibits no distension.  Musculoskeletal: She exhibits no edema or tenderness.  Neurological: She is alert and oriented to person, place, and time. She exhibits normal muscle tone. Coordination normal.  Skin: Skin is warm and dry. No rash noted. She is not diaphoretic. No erythema.  Psychiatric: She has a normal mood and affect. Her behavior is normal. Judgment and thought content normal.    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

## 2016-04-11 NOTE — Patient Instructions (Signed)
Please make appt for 2 week sfor BMP (lab draw)

## 2016-04-11 NOTE — Telephone Encounter (Signed)
Verbal order per Dr. Tommy Medal given to [facility] at [place] to pull patient's picc line. Myrtis Hopping

## 2016-04-14 ENCOUNTER — Telehealth: Payer: Self-pay | Admitting: *Deleted

## 2016-04-14 NOTE — Telephone Encounter (Signed)
Called patient and left a voice mail to inform her that her test tomorrow at [facility] has been cancelled, because it needs prior authorization from her insurance. Will reschedule once the PA is complete. Myrtis Hopping

## 2016-04-15 ENCOUNTER — Ambulatory Visit (HOSPITAL_COMMUNITY): Admission: RE | Admit: 2016-04-15 | Payer: Medicaid Other | Source: Ambulatory Visit

## 2016-05-09 ENCOUNTER — Telehealth: Payer: Self-pay | Admitting: Infectious Disease

## 2016-05-09 ENCOUNTER — Emergency Department (HOSPITAL_COMMUNITY): Payer: Medicaid Other

## 2016-05-09 ENCOUNTER — Inpatient Hospital Stay (HOSPITAL_COMMUNITY)
Admission: EM | Admit: 2016-05-09 | Discharge: 2016-05-13 | DRG: 872 | Disposition: A | Discharge status: Home / Self Care | Payer: Medicaid Other | Attending: Internal Medicine | Admitting: Internal Medicine

## 2016-05-09 ENCOUNTER — Observation Stay (HOSPITAL_COMMUNITY): Payer: Medicaid Other

## 2016-05-09 ENCOUNTER — Encounter (HOSPITAL_COMMUNITY): Payer: Self-pay | Admitting: Emergency Medicine

## 2016-05-09 DIAGNOSIS — I959 Hypotension, unspecified: Secondary | ICD-10-CM

## 2016-05-09 DIAGNOSIS — E039 Hypothyroidism, unspecified: Secondary | ICD-10-CM | POA: Diagnosis present

## 2016-05-09 DIAGNOSIS — F329 Major depressive disorder, single episode, unspecified: Secondary | ICD-10-CM | POA: Diagnosis present

## 2016-05-09 DIAGNOSIS — I10 Essential (primary) hypertension: Secondary | ICD-10-CM | POA: Diagnosis present

## 2016-05-09 DIAGNOSIS — R778 Other specified abnormalities of plasma proteins: Secondary | ICD-10-CM

## 2016-05-09 DIAGNOSIS — R748 Abnormal levels of other serum enzymes: Secondary | ICD-10-CM | POA: Diagnosis not present

## 2016-05-09 DIAGNOSIS — B3789 Other sites of candidiasis: Secondary | ICD-10-CM | POA: Diagnosis present

## 2016-05-09 DIAGNOSIS — M869 Osteomyelitis, unspecified: Secondary | ICD-10-CM | POA: Diagnosis present

## 2016-05-09 DIAGNOSIS — R109 Unspecified abdominal pain: Secondary | ICD-10-CM

## 2016-05-09 DIAGNOSIS — Z7982 Long term (current) use of aspirin: Secondary | ICD-10-CM

## 2016-05-09 DIAGNOSIS — B9629 Other Escherichia coli [E. coli] as the cause of diseases classified elsewhere: Secondary | ICD-10-CM | POA: Diagnosis present

## 2016-05-09 DIAGNOSIS — J449 Chronic obstructive pulmonary disease, unspecified: Secondary | ICD-10-CM | POA: Diagnosis present

## 2016-05-09 DIAGNOSIS — I951 Orthostatic hypotension: Secondary | ICD-10-CM | POA: Diagnosis not present

## 2016-05-09 DIAGNOSIS — F17210 Nicotine dependence, cigarettes, uncomplicated: Secondary | ICD-10-CM | POA: Diagnosis present

## 2016-05-09 DIAGNOSIS — N3000 Acute cystitis without hematuria: Secondary | ICD-10-CM

## 2016-05-09 DIAGNOSIS — M810 Age-related osteoporosis without current pathological fracture: Secondary | ICD-10-CM | POA: Diagnosis present

## 2016-05-09 DIAGNOSIS — I248 Other forms of acute ischemic heart disease: Secondary | ICD-10-CM | POA: Diagnosis present

## 2016-05-09 DIAGNOSIS — A419 Sepsis, unspecified organism: Principal | ICD-10-CM | POA: Diagnosis present

## 2016-05-09 DIAGNOSIS — Z79899 Other long term (current) drug therapy: Secondary | ICD-10-CM

## 2016-05-09 DIAGNOSIS — N390 Urinary tract infection, site not specified: Secondary | ICD-10-CM | POA: Diagnosis present

## 2016-05-09 DIAGNOSIS — M86671 Other chronic osteomyelitis, right ankle and foot: Secondary | ICD-10-CM | POA: Diagnosis present

## 2016-05-09 DIAGNOSIS — R7989 Other specified abnormal findings of blood chemistry: Secondary | ICD-10-CM

## 2016-05-09 DIAGNOSIS — Z1612 Extended spectrum beta lactamase (ESBL) resistance: Secondary | ICD-10-CM | POA: Diagnosis present

## 2016-05-09 DIAGNOSIS — D72829 Elevated white blood cell count, unspecified: Secondary | ICD-10-CM

## 2016-05-09 HISTORY — DX: Other specified abnormal findings of blood chemistry: R79.89

## 2016-05-09 HISTORY — DX: Other specified abnormalities of plasma proteins: R77.8

## 2016-05-09 HISTORY — DX: Hypotension, unspecified: I95.9

## 2016-05-09 LAB — TROPONIN I
TROPONIN I: 0.13 ng/mL — AB (ref <0.03)
TROPONIN I: 0.12 ng/mL — AB (ref <0.03)

## 2016-05-09 LAB — CBC WITH DIFFERENTIAL/PLATELET
BASOS ABS: 0 10*3/uL (ref 0.0–0.1)
Basophils Relative: 0 %
EOS ABS: 0 10*3/uL (ref 0.0–0.7)
Eosinophils Relative: 0 %
HCT: 30.3 % — ABNORMAL LOW (ref 36.0–46.0)
Hemoglobin: 10.4 g/dL — ABNORMAL LOW (ref 12.0–15.0)
LYMPHS ABS: 0.6 10*3/uL — AB (ref 0.7–4.0)
LYMPHS PCT: 4 %
MCH: 30.1 pg (ref 26.0–34.0)
MCHC: 34.3 g/dL (ref 30.0–36.0)
MCV: 87.6 fL (ref 78.0–100.0)
MONOS PCT: 5 %
Monocytes Absolute: 0.8 10*3/uL (ref 0.1–1.0)
NEUTROS ABS: 14.1 10*3/uL — AB (ref 1.7–7.7)
Neutrophils Relative %: 91 %
Platelets: 286 10*3/uL (ref 150–400)
RBC: 3.46 MIL/uL — AB (ref 3.87–5.11)
RDW: 12.5 % (ref 11.5–15.5)
WBC: 15.5 10*3/uL — ABNORMAL HIGH (ref 4.0–10.5)

## 2016-05-09 LAB — COMPREHENSIVE METABOLIC PANEL
ALBUMIN: 3.5 g/dL (ref 3.5–5.0)
ALK PHOS: 66 U/L (ref 38–126)
ALT: 16 U/L (ref 14–54)
ANION GAP: 8 (ref 5–15)
AST: 24 U/L (ref 15–41)
BUN: 17 mg/dL (ref 6–20)
CALCIUM: 8.2 mg/dL — AB (ref 8.9–10.3)
CO2: 24 mmol/L (ref 22–32)
CREATININE: 0.85 mg/dL (ref 0.44–1.00)
Chloride: 100 mmol/L — ABNORMAL LOW (ref 101–111)
GFR calc Af Amer: >60 mL/min (ref >60)
GFR calc non Af Amer: >60 mL/min (ref >60)
GLUCOSE: 125 mg/dL — AB (ref 65–99)
Potassium: 3.7 mmol/L (ref 3.5–5.1)
SODIUM: 132 mmol/L — AB (ref 135–145)
Total Bilirubin: 0.4 mg/dL (ref 0.3–1.2)
Total Protein: 6.4 g/dL — ABNORMAL LOW (ref 6.5–8.1)

## 2016-05-09 LAB — URINALYSIS, ROUTINE W REFLEX MICROSCOPIC
BILIRUBIN URINE: NEGATIVE
Glucose, UA: NEGATIVE mg/dL
KETONES UR: NEGATIVE mg/dL
NITRITE: POSITIVE — AB
PROTEIN: NEGATIVE mg/dL
Specific Gravity, Urine: 1.01 (ref 1.005–1.030)
pH: 6 (ref 5.0–8.0)

## 2016-05-09 LAB — I-STAT CG4 LACTIC ACID, ED
Lactic Acid, Venous: 1.61 mmol/L (ref 0.5–1.9)
Lactic Acid, Venous: 0.79 mmol/L (ref 0.5–1.9)

## 2016-05-09 LAB — URINE MICROSCOPIC-ADD ON

## 2016-05-09 MED ORDER — IOPAMIDOL (ISOVUE-300) INJECTION 61%
75.0000 mL | Freq: Once | INTRAVENOUS | Status: AC | PRN
  Administered 2016-05-09: 75 mL via INTRAVENOUS

## 2016-05-09 MED ORDER — ASPIRIN 81 MG PO CHEW
324.0000 mg | CHEWABLE_TABLET | Freq: Once | ORAL | Status: AC
  Administered 2016-05-09: 324 mg via ORAL
  Filled 2016-05-09: qty 4

## 2016-05-09 MED ORDER — SODIUM CHLORIDE 0.9 % IV SOLN
INTRAVENOUS | Status: DC
  Administered 2016-05-09 – 2016-05-10 (×3): via INTRAVENOUS

## 2016-05-09 MED ORDER — ASPIRIN EC 81 MG PO TBEC
81.0000 mg | DELAYED_RELEASE_TABLET | Freq: Every day | ORAL | Status: DC
  Administered 2016-05-09 – 2016-05-13 (×5): 81 mg via ORAL
  Filled 2016-05-09 (×5): qty 1

## 2016-05-09 MED ORDER — FLUCONAZOLE 100 MG PO TABS
400.0000 mg | ORAL_TABLET | Freq: Every day | ORAL | Status: DC
  Administered 2016-05-09 – 2016-05-12 (×4): 400 mg via ORAL
  Filled 2016-05-09 (×4): qty 4

## 2016-05-09 MED ORDER — LEVOTHYROXINE SODIUM 88 MCG PO TABS
88.0000 ug | ORAL_TABLET | Freq: Every day | ORAL | Status: DC
  Administered 2016-05-10 – 2016-05-13 (×4): 88 ug via ORAL
  Filled 2016-05-09 (×4): qty 1

## 2016-05-09 MED ORDER — ALBUTEROL SULFATE (2.5 MG/3ML) 0.083% IN NEBU
2.5000 mg | INHALATION_SOLUTION | RESPIRATORY_TRACT | Status: DC | PRN
  Administered 2016-05-11: 2.5 mg via RESPIRATORY_TRACT
  Filled 2016-05-09: qty 3

## 2016-05-09 MED ORDER — SODIUM CHLORIDE 0.9% FLUSH
3.0000 mL | Freq: Two times a day (BID) | INTRAVENOUS | Status: DC
  Administered 2016-05-10 – 2016-05-13 (×4): 3 mL via INTRAVENOUS

## 2016-05-09 MED ORDER — CEFTRIAXONE SODIUM 1 G IJ SOLR
1.0000 g | Freq: Once | INTRAMUSCULAR | Status: AC
  Administered 2016-05-09: 1 g via INTRAVENOUS
  Filled 2016-05-09: qty 10

## 2016-05-09 MED ORDER — DEXTROSE 5 % IV SOLN
1.0000 g | INTRAVENOUS | Status: DC
  Administered 2016-05-10: 1 g via INTRAVENOUS
  Filled 2016-05-09: qty 10

## 2016-05-09 MED ORDER — FLUTICASONE PROPIONATE 50 MCG/ACT NA SUSP: 2.0000 | Freq: Every day | NASAL | Status: DC | PRN

## 2016-05-09 MED ORDER — HEPARIN SODIUM (PORCINE) 5000 UNIT/ML IJ SOLN
5000.0000 [IU] | Freq: Three times a day (TID) | INTRAMUSCULAR | Status: DC
  Administered 2016-05-09 – 2016-05-11 (×5): 5000 [IU] via SUBCUTANEOUS
  Filled 2016-05-09 (×5): qty 1

## 2016-05-09 MED ORDER — IBUPROFEN 200 MG PO TABS
200.0000 mg | ORAL_TABLET | Freq: Four times a day (QID) | ORAL | Status: DC | PRN
  Administered 2016-05-09: 200 mg via ORAL
  Filled 2016-05-09: qty 1

## 2016-05-09 MED ORDER — SODIUM CHLORIDE 0.9 % IV BOLUS (SEPSIS)
1000.0000 mL | Freq: Once | INTRAVENOUS | Status: AC
  Administered 2016-05-09: 1000 mL via INTRAVENOUS

## 2016-05-09 MED ORDER — IOPAMIDOL (ISOVUE-300) INJECTION 61%
30.0000 mL | Freq: Once | INTRAVENOUS | Status: AC | PRN
  Administered 2016-05-09: 30 mL via ORAL

## 2016-05-09 MED ORDER — FLUOXETINE HCL 20 MG PO CAPS
80.0000 mg | ORAL_CAPSULE | Freq: Every day | ORAL | Status: DC
  Administered 2016-05-10 – 2016-05-13 (×4): 80 mg via ORAL
  Filled 2016-05-09 (×5): qty 4

## 2016-05-09 MED ORDER — ENSURE ENLIVE PO LIQD
237.0000 mL | Freq: Two times a day (BID) | ORAL | Status: DC
  Administered 2016-05-10 – 2016-05-13 (×7): 237 mL via ORAL

## 2016-05-09 MED ORDER — ACETAMINOPHEN 650 MG RE SUPP: 650.0000 mg | Freq: Four times a day (QID) | RECTAL | Status: DC | PRN

## 2016-05-09 MED ORDER — FLUCONAZOLE 200 MG PO TABS: 400.0000 mg | ORAL_TABLET | Freq: Every day | ORAL | 10 refills | Status: DC

## 2016-05-09 MED ORDER — ACETAMINOPHEN 325 MG PO TABS
650.0000 mg | ORAL_TABLET | Freq: Four times a day (QID) | ORAL | Status: DC | PRN
  Administered 2016-05-09 – 2016-05-11 (×5): 650 mg via ORAL
  Filled 2016-05-09 (×5): qty 2

## 2016-05-09 NOTE — ED Notes (Signed)
Bed: WA02 Expected date:  Expected time:  Means of arrival:  Comments: EMS 65 yo F weakness

## 2016-05-09 NOTE — H&P (Signed)
TRH H&P   Patient Demographics:    Amy Hamilton, is a 65 y.o. female  MRN: [id number]   DOB - 1951/04/08  Admit Date - 05/09/2016  Outpatient Primary MD for the patient is Harvie Junior, MD  Referring MD/NP/PA: Dr Verta Ellen  Outpatient Specialists: Dr Lucianne Lei dam   Patient coming from: [facility]  Chief Complaint  Patient presents with  . Hypotension      HPI:    Amy Hamilton  is a 65 y.o. female, With history of hypothyroidism, osteoporosis, and right ankle osteomyelitis, status post IV antibiotics and hardware removal, the presents with generalized weakness and fatigue, and hypotension, and reports she has been feeling weak over last 48 hours, and feeling lightheaded and dizzy, checked by family friends where she was found to be hypotensive with systolic blood pressure in the 60s, the same by EMS who gave her 500 mL fluid bolus, this has improved in Ed but she remains orthostatic, she denies any chest pain, shortness of breath, coffee-ground emesis, nausea, vomiting, diarrhea or bright blood per rectum, reports mild dysuria and right lower quadrant abdominal pain, workup significant for leukocytosis of 15 K, and troponins of 0.13, EKG nonacute, she received aspirin, and Rocephin for her UTI under hospitalist requested to admit . - Of note patient with known right ankle ostium myelitis, finished her IV antibiotics a few weeks ago, supposed to be on Bactrim daily, but she has not been able to take it giving nausea, reports she takes it on average 1 tablet every 2-3 days, just came back to Dr. Drucilla Schmidt office today growing Candida, so she was supposed to start fluconazole 400 mg oral daily today.   Review of systems:    In addition to the HPI above, Reports chill at home, but afebrile No Headache, No changes with Vision or hearing, No problems swallowing food or Liquids, No Chest pain,  or  Shortness of Breath, worse dry cough No Nausea or Vommitting, Bowel movements are regular, reports abdominal pain over last 24 hours No blood in urine or stool Proximal dysuria No new skin rashes or bruises, No new joints pains-aches,  No new weakness, tingling, numbness in any extremity, report generalized weakness and fatigue No recent weight gain or loss, No polyuria, polydypsia or polyphagia, No significant Mental Stressors.  A full 10 point Review of Systems was done, except as stated above, all other Review of Systems were negative.   With Past History of the following :    Past Medical History:  Diagnosis Date  . Chronic back pain   . DDD (degenerative disc disease), cervical   . Depression   . Hardware complicating wound infection ([place]) 02/02/2016  . Osteomyelitis, 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[place]) 02/02/2016  . Routine screening for STI (sexually transmitted infection) 02/02/2016  . Thyroid disease   . Uterine prolapse       Past Surgical History:  Procedure Laterality Date  . ABDOMINAL HYSTERECTOMY    . ANKLE SURGERY Right   . APPLICATION OF WOUND VAC Left 01/06/2015   Procedure: APPLICATION OF WOUND VAC;  Surgeon: Milly Jakob, MD;  Location: [place];  Service: Orthopedics;  Laterality: Left;  . BACK SURGERY    . CARPAL TUNNEL RELEASE Left 01/06/2015   Procedure: CARPAL TUNNEL RELEASE;  Surgeon: Milly Jakob, MD;  Location: [place];  Service: Orthopedics;  Laterality: Left;  . FEMUR IM NAIL Left 10/18/2012   Procedure: INTRAMEDULLARY (IM) NAIL FEMORAL;  Surgeon: Meredith Pel, MD;  Location: [place];  Service: Orthopedics;  Laterality: Left;  . I&D EXTREMITY Left 01/06/2015   Procedure: IRRIGATION AND DEBRIDEMENT EXTREMITY, EVACUATION HEMATOMA;  Surgeon: Milly Jakob, MD;  Location: [place];  Service: Orthopedics;  Laterality: Left;  . ORIF ULNAR FRACTURE Left 01/04/2015   Procedure: OPEN REDUCTION INTERNAL FIXATION (ORIF) ULNAR FRACTURE;  Surgeon: Milly Jakob, MD;  Location:  [place];  Service: Orthopedics;  Laterality: Left;  . SECONDARY CLOSURE OF WOUND Left 01/15/2015   Procedure: SECONDARY CLOSURE OF WOUND LEFT FOREARM;  Surgeon: Milly Jakob, MD;  Location: [place];  Service: Orthopedics;  Laterality: Left;      Social History:     Social History  Substance Use Topics  . Smoking status: Current Every Day Smoker    Packs/day: 0.50    Types: Cigarettes  . Smokeless tobacco: Never Used  . Alcohol use No     Lives - Home  Mobility - independent     Family History :    History reviewed. No pertinent family history.   Home Medications:   Prior to Admission medications   Medication Sig Start Date End Date Taking? Authorizing Provider  albuterol (PROVENTIL HFA;VENTOLIN HFA) 108 (90 BASE) MCG/ACT inhaler Inhale 1-2 puffs into the lungs every 6 (six) hours as needed for wheezing or shortness of breath. Patient taking differently: Inhale 2 puffs into the lungs every 6 (six) hours as needed for wheezing or shortness of breath.  10/26/12  Yes Daniel J Angiulli, PA-C  amphetamine-dextroamphetamine (ADDERALL) 20 MG tablet Take 20 mg by mouth 2 (two) times daily as needed (focus).   Yes Historical Provider, MD  aspirin EC 81 MG EC tablet Take 1 tablet (81 mg total) by mouth daily. Patient taking differently: Take 81 mg by mouth every morning.  01/07/15  Yes Nishant Dhungel, MD  Aspirin-Acetaminophen-Caffeine (GOODY HEADACHE PO) Take 1 packet by mouth 2 (two) times daily as needed (pain).    Yes Historical Provider, MD  estradiol (ESTRACE) 0.5 MG tablet Take 0.5 mg by mouth daily at 12 noon.   Yes Historical Provider, MD  FLUoxetine (PROZAC) 40 MG capsule Take 2 capsules (80 mg total) by mouth daily. Patient taking differently: Take 80 mg by mouth every morning.  10/26/12  Yes Daniel J Angiulli, PA-C  fluticasone (FLONASE) 50 MCG/ACT nasal spray Place 2 sprays into both nostrils daily as needed for allergies or rhinitis.   Yes Historical Provider,  MD  levothyroxine (SYNTHROID, LEVOTHROID) 88 MCG tablet Take 88 mcg by mouth daily before breakfast.   Yes Historical Provider, MD  sulfamethoxazole-trimethoprim (BACTRIM DS,SEPTRA DS) 800-160 MG tablet Take 1 tablet by mouth 2 (two) times daily. 04/11/16  Yes Truman Hayward, MD  VITAMIN E PO Take 1 capsule by mouth 2 (two) times daily. Reported on 02/02/2016   Yes Historical Provider, MD  fluconazole (DIFLUCAN) 200 MG tablet Take 2 tablets (400 mg total) by  mouth daily. 05/09/16   Truman Hayward, MD     Allergies:    No Known Allergies   Physical Exam:   Vitals  Blood pressure 108/67, pulse 67, temperature 98.3 F (36.8 C), temperature source Oral, resp. rate 24, height '5\' 3"'$  (1.6 m), weight 49.9 kg (110 lb), SpO2 97 %.   1. General Frail, thin-appearing female lying in bed in NAD,    2. Normal affect and insight, Not Suicidal or Homicidal, Awake Alert, Oriented X 3.  3. No F.N deficits, ALL C.Nerves Intact, Strength 5/5 all 4 extremities, Sensation intact all 4 extremities, Plantars down going.  4. Ears and Eyes appear Normal, Conjunctivae clear, PERRLA. Moist Oral Mucosa.  5. Supple Neck, No JVD, No cervical lymphadenopathy appriciated, No Carotid Bruits.  6. Symmetrical Chest wall movement, Good air movement bilaterally, CTAB.  7. RRR, No Gallops, Rubs or Murmurs, No Parasternal Heave.  8. Positive Bowel Sounds, Abdomen Soft, Right lower quadrant tenderness, No organomegaly appriciated,No rebound -guarding or rigidity.  9.  No Cyanosis, Normal Skin Turgor, No Skin Rash or Bruise.  10. Good muscle tone,  joints appear normal , no effusions, Normal ROM.  11. No Palpable Lymph Nodes in Neck or Axillae     Data Review:    CBC  Recent Labs Lab 05/09/16 1334  WBC 15.5*  HGB 10.4*  HCT 30.3*  PLT 286  MCV 87.6  MCH 30.1  MCHC 34.3  RDW 12.5  LYMPHSABS 0.6*  MONOABS 0.8  EOSABS 0.0  BASOSABS 0.0    ------------------------------------------------------------------------------------------------------------------  Chemistries   Recent Labs Lab 05/09/16 1334  NA 132*  K 3.7  CL 100*  CO2 24  GLUCOSE 125*  BUN 17  CREATININE 0.85  CALCIUM 8.2*  AST 24  ALT 16  ALKPHOS 66  BILITOT 0.4   ------------------------------------------------------------------------------------------------------------------ estimated creatinine clearance is 52.7 mL/min (by C-G formula based on SCr of 0.85 mg/dL). ------------------------------------------------------------------------------------------------------------------ No results for input(s): TSH, T4TOTAL, T3FREE, THYROIDAB in the last 72 hours.  Invalid input(s): FREET3  Coagulation profile No results for input(s): INR, PROTIME in the last 168 hours. ------------------------------------------------------------------------------------------------------------------- No results for input(s): DDIMER in the last 72 hours. -------------------------------------------------------------------------------------------------------------------  Cardiac Enzymes  Recent Labs Lab 05/09/16 1445  TROPONINI 0.13*   ------------------------------------------------------------------------------------------------------------------ No results found for: BNP   ---------------------------------------------------------------------------------------------------------------  Urinalysis    Component Value Date/Time   COLORURINE YELLOW 05/09/2016 1435   APPEARANCEUR CLOUDY (A) 05/09/2016 1435   LABSPEC 1.010 05/09/2016 1435   PHURINE 6.0 05/09/2016 1435   GLUCOSEU NEGATIVE 05/09/2016 1435   HGBUR MODERATE (A) 05/09/2016 1435   BILIRUBINUR NEGATIVE 05/09/2016 1435   KETONESUR NEGATIVE 05/09/2016 1435   PROTEINUR NEGATIVE 05/09/2016 1435   UROBILINOGEN 0.2 07/08/2013 1156   NITRITE POSITIVE (A) 05/09/2016 1435   LEUKOCYTESUR LARGE (A) 05/09/2016  1435    ----------------------------------------------------------------------------------------------------------------   Imaging Results:    Dg Chest 2 View  Result Date: 05/09/2016 CLINICAL DATA:  Cough productive of yellow sputum for 3 weeks, hypotension, smoker EXAM: CHEST  2 VIEW COMPARISON:  01/05/2015 FINDINGS: Normal heart size and pulmonary vascularity. Atherosclerotic calcification aorta. Small hiatal hernia. Lungs emphysematous but clear. No pleural effusion or pneumothorax. Bones demineralized with old healed posttraumatic deformity of the proximal LEFT humerus. Levoconvex thoracolumbar scoliosis. IMPRESSION: Emphysematous changes question COPD. No acute infiltrate. Small hiatal hernia. Electronically Signed   By: Lavonia Dana M.D.   On: 05/09/2016 15:29    My personal review of EKG: Rhythm NSR, Rate 64  /min, QTc 441 ,  With old ST changes   Assessment & Plan:    Active Problems:   Abdominal pain, acute   Osteomyelitis, 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HCC)   Elevated troponin   Hypotension  Hypotension and leukocytosis - This is most likely due to infectious etiology, patient does not meet sepsis criteria on admission. - Continue with IV fluids, monitor on telemetry.  UTI - Started on Rocephin, follow on urine culture  Abdominal pain, right lower quadrant - We'll obtain CT abdomen pelvis stat for further evaluation  Osteomyelitis of right ankle and foot - By Dr. Drucilla Schmidt as an outpatient, hardware already taken out, patient finished 6 weeks of IV antibiotics, close to be on oral Bactrim which she has not been taking consistently giving nausea, discussed with ID Dr. Drucilla Schmidt, wound cultures actually growing Candida, so recommendation is for fluconazole 400 mg oral daily for 1 year. And she can stop Bactrim - To follow with him as an outpatient  Elevated troponin - Was likely in the setting of demand ischemia and infection, she denies any chest pain or shortness of breath, will cycle  cardiac enzymes, monitor in telemetry and obtain echo,  received aspirin in NAD, EKG nonacute  Hypothyroidism - Continue with Synthroid  DVT Prophylaxis Heparin - SCDs   AM Labs Ordered, also please review Full Orders  Family Communication: Admission, patients condition and plan of care including tests being ordered have been discussed with the patient  who indicate understanding and agree with the plan and Code Status.  Code Status Full  Likely DC to  Home  Condition GUARDED   Consults called: D/W ID   Admission status: onservation  Time spent in minutes : 65 minutes   Vasili Fok M.D on 05/09/2016 at 4:57 PM  Between 7am to 7pm - Pager - [phone]. After 7pm go to [email or web address] - password [facility]  [facility] - Office  [phone]

## 2016-05-09 NOTE — ED Triage Notes (Signed)
Pt is from home.  HX of osteomyelitis with PICC line removed about 1 month ago.  HTN Hx also.  BP 84 palpated while standing up upon EMS arrival.  98/60 after 58m bolus.  Alert and oriented Denies N/V/D.  Began last night, dizzy when standing.

## 2016-05-09 NOTE — ED Notes (Signed)
Urine results posted at 1522 are incorrect and not for this patient per lab call to RN

## 2016-05-09 NOTE — ED Provider Notes (Signed)
[facility] DEPT Provider Note   CSN: [id number] Arrival date & time: 05/09/16  1302     History   Chief Complaint Chief Complaint  Patient presents with  . Hypotension    HPI Amy Hamilton is a 65 y.o. female.  HPI  65 year old female presents by EMS for hypotension. Patient states that around 2 AM she was going to go to the bathroom and noticed that she was dizzy and had a hard time getting to her bathroom. When she woke back up she has been dizzy ever since. Most prominent when standing up. She feels like she's going to pass out. There is no room spinning sensation or feeling off balance. Her friends Told her not to fall sleep. When they checked her blood pressure this morning it was consistently in the 44W systolic. She was given 500 mL IV fluids by EMS who noted that she was orthostatic. Patient states that she's been having vomiting for the last week or so that she attributed due to antibiotics. She was recently treated for osteomyelitis of a hardware infection of her ankle. She is not sure what antibiotic she is on but has not taken it for 10 days due to perceived complications. She states she's been having some fevers intermittently, most recently 2 days ago and it was 100.3. Has had a cough for 3 weeks ever since the PICC line was removed. Feels short of breath all the time and states this is not particular worse. No urinary symptoms. Ankle feels fine.  Past Medical History:  Diagnosis Date  . Chronic back pain   . DDD (degenerative disc disease), cervical   . Depression   . Hardware complicating wound infection ([place]) 02/02/2016  . 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 Routine screening for STI (sexually transmitted infection) 02/02/2016  . Thyroid disease   . Uterine prolapse     Patient Active Problem List   Diagnosis Date Noted  . Elevated troponin 05/09/2016  . Hypotension 05/09/2016  . Hardware complicating wound infection ([place]) 02/02/2016  .  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 Routine screening for STI (sexually transmitted infection) 02/02/2016  . Chest pain, musculoskeletal 01/07/2015  . Postoperative hematoma 01/07/2015  . Acute carpal tunnel syndrome of left wrist 01/07/2015  . Hypokalemia 01/07/2015  . Acute blood loss anemia 01/07/2015  . Abnormal EKG 01/06/2015  . Abdominal pain, acute 01/06/2015  . Hyponatremia 01/06/2015  . Anemia 01/06/2015  . Closed left radial fracture 01/06/2015  . S/P ORIF (open reduction internal fixation) fracture 01/06/2015  . Intertrochanteric fracture of left hip ([place]) 10/23/2012  . Hip fracture, left ([place]) 10/17/2012  . Fall at home 10/17/2012  . Hypothyroidism 10/17/2012  . Depression 10/17/2012  . Back pain 10/17/2012    Past Surgical History:  Procedure Laterality Date  . ABDOMINAL HYSTERECTOMY    . ANKLE SURGERY Right   . APPLICATION OF WOUND VAC Left 01/06/2015   Procedure: APPLICATION OF WOUND VAC;  Surgeon: Milly Jakob, MD;  Location: [facility];  Service: Orthopedics;  Laterality: Left;  . BACK SURGERY    . CARPAL TUNNEL RELEASE Left 01/06/2015   Procedure: CARPAL TUNNEL RELEASE;  Surgeon: Milly Jakob, MD;  Location: [facility];  Service: Orthopedics;  Laterality: Left;  . FEMUR IM NAIL Left 10/18/2012   Procedure: INTRAMEDULLARY (IM) NAIL FEMORAL;  Surgeon: Meredith Pel, MD;  Location: [facility];  Service: Orthopedics;  Laterality: Left;  . I&D EXTREMITY Left 01/06/2015   Procedure: IRRIGATION AND DEBRIDEMENT  EXTREMITY, EVACUATION HEMATOMA;  Surgeon: Milly Jakob, MD;  Location: [facility];  Service: Orthopedics;  Laterality: Left;  . ORIF ULNAR FRACTURE Left 01/04/2015   Procedure: OPEN REDUCTION INTERNAL FIXATION (ORIF) ULNAR FRACTURE;  Surgeon: Milly Jakob, MD;  Location: [facility];  Service: Orthopedics;  Laterality: Left;  . SECONDARY CLOSURE OF WOUND Left 01/15/2015   Procedure: SECONDARY CLOSURE OF WOUND LEFT FOREARM;  Surgeon: Milly Jakob, MD;  Location:  [facility];  Service: Orthopedics;  Laterality: Left;    OB History    No data available       Home Medications    Prior to Admission medications   Medication Sig Start Date End Date Taking? Authorizing Provider  albuterol (PROVENTIL HFA;VENTOLIN HFA) 108 (90 BASE) MCG/ACT inhaler Inhale 1-2 puffs into the lungs every 6 (six) hours as needed for wheezing or shortness of breath. Patient taking differently: Inhale 2 puffs into the lungs every 6 (six) hours as needed for wheezing or shortness of breath.  10/26/12  Yes Daniel J Angiulli, PA-C  amphetamine-dextroamphetamine (ADDERALL) 20 MG tablet Take 20 mg by mouth 2 (two) times daily as needed (focus).   Yes Historical Provider, MD  aspirin EC 81 MG EC tablet Take 1 tablet (81 mg total) by mouth daily. Patient taking differently: Take 81 mg by mouth every morning.  01/07/15  Yes Nishant Dhungel, MD  Aspirin-Acetaminophen-Caffeine (GOODY HEADACHE PO) Take 1 packet by mouth 2 (two) times daily as needed (pain).    Yes Historical Provider, MD  estradiol (ESTRACE) 0.5 MG tablet Take 0.5 mg by mouth daily at 12 noon.   Yes Historical Provider, MD  FLUoxetine (PROZAC) 40 MG capsule Take 2 capsules (80 mg total) by mouth daily. Patient taking differently: Take 80 mg by mouth every morning.  10/26/12  Yes Daniel J Angiulli, PA-C  fluticasone (FLONASE) 50 MCG/ACT nasal spray Place 2 sprays into both nostrils daily as needed for allergies or rhinitis.   Yes Historical Provider, MD  levothyroxine (SYNTHROID, LEVOTHROID) 88 MCG tablet Take 88 mcg by mouth daily before breakfast.   Yes Historical Provider, MD  sulfamethoxazole-trimethoprim (BACTRIM DS,SEPTRA DS) 800-160 MG tablet Take 1 tablet by mouth 2 (two) times daily. 04/11/16  Yes Truman Hayward, MD  VITAMIN E PO Take 1 capsule by mouth 2 (two) times daily. Reported on 02/02/2016   Yes Historical Provider, MD  fluconazole (DIFLUCAN) 200 MG tablet Take 2 tablets (400 mg total) by mouth  daily. 05/09/16   Truman Hayward, MD    Family History History reviewed. No pertinent family history.  Social History Social History  Substance Use Topics  . Smoking status: Current Every Day Smoker    Packs/day: 0.50    Types: Cigarettes  . Smokeless tobacco: Never Used  . Alcohol use No     Allergies   Review of patient's allergies indicates no known allergies.   Review of Systems Review of Systems  Constitutional: Positive for fever.  Respiratory: Positive for cough. Negative for shortness of breath.   Cardiovascular: Negative for chest pain.  Gastrointestinal: Positive for vomiting. Negative for abdominal pain and diarrhea.  Genitourinary: Negative for dysuria.  Musculoskeletal: Negative for arthralgias.  Neurological: Positive for dizziness, weakness and light-headedness. Negative for syncope and headaches.  All other systems reviewed and are negative.    Physical Exam Updated Vital Signs BP 132/65 (BP Location: Right Arm)   Pulse 77   Temp (!) 100.4 F (38 C) (Oral)   Resp 18  Ht '5\' 3"'$  (1.6 m)   Wt 110 lb (49.9 kg)   SpO2 96%   BMI 19.49 kg/m   Physical Exam  Constitutional: She is oriented to person, place, and time. She appears well-developed and well-nourished.  HENT:  Head: Normocephalic and atraumatic.  Right Ear: External ear normal.  Left Ear: External ear normal.  Nose: Nose normal.  Mouth/Throat: Oropharynx is clear and moist.  Eyes: EOM are normal. Pupils are equal, round, and reactive to light. Right eye exhibits no discharge. Left eye exhibits no discharge.  Neck: Neck supple.  Cardiovascular: Normal rate, regular rhythm and normal heart sounds.   No murmur heard. Pulmonary/Chest: Effort normal and breath sounds normal.  Abdominal: Soft. There is no tenderness.  Musculoskeletal:  Right ankle with no redness or joint swelling  Neurological: She is alert and oriented to person, place, and time.  CN 3-12 grossly intact. 5/5 strength  in all 4 extremities. Grossly normal sensation. Normal finger to nose.   Skin: Skin is warm and dry.  Nursing note and vitals reviewed.    ED Treatments / Results  Labs (all labs ordered are listed, but only abnormal results are displayed) Labs Reviewed  COMPREHENSIVE METABOLIC PANEL - Abnormal; Notable for the following:       Result Value   Sodium 132 (*)    Chloride 100 (*)    Glucose, Bld 125 (*)    Calcium 8.2 (*)    Total Protein 6.4 (*)    All other components within normal limits  CBC WITH DIFFERENTIAL/PLATELET - Abnormal; Notable for the following:    WBC 15.5 (*)    RBC 3.46 (*)    Hemoglobin 10.4 (*)    HCT 30.3 (*)    Neutro Abs 14.1 (*)    Lymphs Abs 0.6 (*)    All other components within normal limits  URINALYSIS, ROUTINE W REFLEX MICROSCOPIC (NOT AT [facility]) - Abnormal; Notable for the following:    APPearance CLOUDY (*)    Hgb urine dipstick MODERATE (*)    Nitrite POSITIVE (*)    Leukocytes, UA LARGE (*)    All other components within normal limits  TROPONIN I - Abnormal; Notable for the following:    Troponin I 0.13 (*)    All other components within normal limits  URINE MICROSCOPIC-ADD ON - Abnormal; Notable for the following:    Squamous Epithelial / LPF 6-30 (*)    Bacteria, UA MANY (*)    All other components within normal limits  TROPONIN I - Abnormal; Notable for the following:    Troponin I 0.12 (*)    All other components within normal limits  URINE CULTURE  CULTURE, BLOOD (ROUTINE X 2)  CULTURE, BLOOD (ROUTINE X 2)  TROPONIN I  TROPONIN I  BASIC METABOLIC PANEL  CBC  I-STAT CG4 LACTIC ACID, ED  I-STAT CG4 LACTIC ACID, ED    EKG  EKG Interpretation  Date/Time:  Monday May 09 2016 14:38:18 EDT Ventricular Rate:  68 PR Interval:    QRS Duration: 93 QT Interval:  404 QTC Calculation: 430 R Axis:   29 Text Interpretation:  Sinus rhythm Probable LVH with secondary repol abnrm Anterior Q waves, possibly due to LVH nonspecific ST/T  waves similar to 1 hour prior Confirmed by Kenzel Ruesch MD, Monna Crean [id number]) on 05/09/2016 4:09:54 PM       Radiology Dg Chest 2 View  Result Date: 05/09/2016 CLINICAL DATA:  Cough productive of yellow sputum for 3 weeks, hypotension, smoker EXAM:  CHEST  2 VIEW COMPARISON:  01/05/2015 FINDINGS: Normal heart size and pulmonary vascularity. Atherosclerotic calcification aorta. Small hiatal hernia. Lungs emphysematous but clear. No pleural effusion or pneumothorax. Bones demineralized with old healed posttraumatic deformity of the proximal LEFT humerus. Levoconvex thoracolumbar scoliosis. IMPRESSION: Emphysematous changes question COPD. No acute infiltrate. Small hiatal hernia. Electronically Signed   By: Lavonia Dana M.D.   On: 05/09/2016 15:29   Ct Abdomen Pelvis W Contrast  Result Date: 05/09/2016 CLINICAL DATA:  Abdominal pain EXAM: CT ABDOMEN AND PELVIS WITH CONTRAST TECHNIQUE: Multidetector CT imaging of the abdomen and pelvis was performed using the standard protocol following bolus administration of intravenous contrast. CONTRAST:  81m ISOVUE-300 IOPAMIDOL (ISOVUE-300) INJECTION 61%, 333mISOVUE-300 IOPAMIDOL (ISOVUE-300) INJECTION 61% COMPARISON:  CT abdomen pelvis 01/06/2015 FINDINGS: Lower chest: Lung bases clear.  Moderate hiatal hernia. Hepatobiliary: Cholelithiasis. No gallbladder wall thickening. Bile ducts nondilated. Normal liver. Pancreas: Negative Spleen: Negative Adrenals/Urinary Tract: Symmetric excretion of contrast by both kidneys without obstruction. No renal calculi. 3 cm right renal cyst laterally. Normal urinary bladder. Stomach/Bowel: Stomach and duodenum normal. Hiatal hernia. Negative for bowel obstruction. No bowel mass or edema. Appendix not discretely visualized but no evidence of inflammation in the right lower quadrant. Vascular/Lymphatic: Atherosclerotic calcification without aneurysm. No adenopathy. Reproductive: Hysterectomy.  No pelvic mass. Other: No free fluid or free  air.  Negative for hernia. Musculoskeletal: Moderate dextroscoliosis with multilevel lumbar degenerative change. No acute fracture. Left hip fracture fixation. IMPRESSION: Cholelithiasis without evidence of cholecystitis Hiatal hernia Atherosclerotic disease without aortic aneurysm No acute abnormality identified.  Appendix not visualized. Electronically Signed   By: ChFranchot Gallo.D.   On: 05/09/2016 18:18    Procedures Procedures (including critical care time)  Medications Ordered in ED Medications  fluconazole (DIFLUCAN) tablet 400 mg (400 mg Oral Given 05/09/16 2005)  aspirin EC tablet 81 mg (81 mg Oral Given 05/09/16 2005)  levothyroxine (SYNTHROID, LEVOTHROID) tablet 88 mcg (not administered)  fluticasone (FLONASE) 50 MCG/ACT nasal spray 2 spray (not administered)  FLUoxetine (PROZAC) capsule 80 mg (not administered)  heparin injection 5,000 Units (5,000 Units Subcutaneous Given 05/09/16 2235)  sodium chloride flush (NS) 0.9 % injection 3 mL (3 mLs Intravenous Not Given 05/09/16 2200)  0.9 %  sodium chloride infusion ( Intravenous New Bag/Given 05/09/16 1850)  acetaminophen (TYLENOL) tablet 650 mg (650 mg Oral Given 05/09/16 2235)    Or  acetaminophen (TYLENOL) suppository 650 mg ( Rectal See Alternative 05/09/16 2235)  ibuprofen (ADVIL,MOTRIN) tablet 200 mg (200 mg Oral Given 05/09/16 2319)  albuterol (PROVENTIL) (2.5 MG/3ML) 0.083% nebulizer solution 2.5 mg (not administered)  cefTRIAXone (ROCEPHIN) 1 g in dextrose 5 % 50 mL IVPB (not administered)  feeding supplement (ENSURE ENLIVE) (ENSURE ENLIVE) liquid 237 mL (not administered)  sodium chloride 0.9 % bolus 1,000 mL (0 mLs Intravenous Stopped 05/09/16 1629)  cefTRIAXone (ROCEPHIN) 1 g in dextrose 5 % 50 mL IVPB (0 g Intravenous Stopped 05/09/16 1629)  aspirin chewable tablet 324 mg (324 mg Oral Given 05/09/16 1628)  iopamidol (ISOVUE-300) 61 % injection 30 mL (30 mLs Oral Contrast Given 05/09/16 1752)  iopamidol (ISOVUE-300)  61 % injection 75 mL (75 mLs Intravenous Contrast Given 05/09/16 1752)     Initial Impression / Assessment and Plan / ED Course  I have reviewed the triage vital signs and the nursing notes.  Pertinent labs & imaging results that were available during my care of the patient were reviewed by me and considered in my medical decision making (see chart  for details).  Clinical Course  Comment By Time  Will check ECG, labs, CXR, urine. Overall appears well currently. Will give fluids. Unclear why her BP was in the 60s. Doubt sepsis. Dizziness seems to be from BP rather than CNS process Sherwood Gambler, MD 10/16 1425  D/w Dr. Linus Salmons, he recommends keeping the ID appt in a couple week and no antibiotics at this time. Sherwood Gambler, MD 10/16 1444  Urine c/w UTI. She is not symptomatic but given WBC and reported Hypotension will treat. However over a couple hours has had all BPs over 939 systolic with normal lactate. Will give rocephin here but I do not think she has sepsis and would be ok for outpatient treatment. Sherwood Gambler, MD 10/16 1531  Troponin mildly elevated. She mentioned transient left arm heaviness this Am but no CP. Mild elevation may have been from consistently low BP, will have her observed with hospitalist. Dr. Landis Gandy to admit to tele obs. Sherwood Gambler, MD 10/16 [phone]    Final Clinical Impressions(s) / ED Diagnoses   Final diagnoses:  Abdominal pain  Elevated Troponin UTI  New Prescriptions Current Discharge Medication List    START taking these medications   Details  fluconazole (DIFLUCAN) 200 MG tablet Take 2 tablets (400 mg total) by mouth daily. Qty: 60 tablet, Refills: [place], MD 05/10/16 0009

## 2016-05-09 NOTE — Telephone Encounter (Signed)
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

## 2016-05-09 NOTE — ED Notes (Signed)
CT request Pt to stay in ED for a few more minutes to complete CT.

## 2016-05-10 ENCOUNTER — Other Ambulatory Visit (HOSPITAL_COMMUNITY): Payer: Medicaid Other

## 2016-05-10 DIAGNOSIS — R748 Abnormal levels of other serum enzymes: Secondary | ICD-10-CM | POA: Diagnosis not present

## 2016-05-10 DIAGNOSIS — D72829 Elevated white blood cell count, unspecified: Secondary | ICD-10-CM | POA: Diagnosis not present

## 2016-05-10 DIAGNOSIS — N390 Urinary tract infection, site not specified: Secondary | ICD-10-CM | POA: Diagnosis present

## 2016-05-10 DIAGNOSIS — R109 Unspecified abdominal pain: Secondary | ICD-10-CM | POA: Diagnosis not present

## 2016-05-10 DIAGNOSIS — I248 Other forms of acute ischemic heart disease: Secondary | ICD-10-CM | POA: Diagnosis present

## 2016-05-10 DIAGNOSIS — A419 Sepsis, unspecified organism: Secondary | ICD-10-CM

## 2016-05-10 DIAGNOSIS — Z7982 Long term (current) use of aspirin: Secondary | ICD-10-CM | POA: Diagnosis not present

## 2016-05-10 DIAGNOSIS — B3789 Other sites of candidiasis: Secondary | ICD-10-CM | POA: Diagnosis present

## 2016-05-10 DIAGNOSIS — M86671 Other chronic osteomyelitis, right ankle and foot: Secondary | ICD-10-CM | POA: Diagnosis present

## 2016-05-10 DIAGNOSIS — E039 Hypothyroidism, unspecified: Secondary | ICD-10-CM | POA: Diagnosis present

## 2016-05-10 DIAGNOSIS — J449 Chronic obstructive pulmonary disease, unspecified: Secondary | ICD-10-CM | POA: Diagnosis present

## 2016-05-10 DIAGNOSIS — Z1612 Extended spectrum beta lactamase (ESBL) resistance: Secondary | ICD-10-CM | POA: Diagnosis present

## 2016-05-10 DIAGNOSIS — M869 Osteomyelitis, unspecified: Secondary | ICD-10-CM | POA: Diagnosis not present

## 2016-05-10 DIAGNOSIS — R079 Chest pain, unspecified: Secondary | ICD-10-CM | POA: Diagnosis not present

## 2016-05-10 DIAGNOSIS — B9629 Other Escherichia coli [E. coli] as the cause of diseases classified elsewhere: Secondary | ICD-10-CM | POA: Diagnosis present

## 2016-05-10 DIAGNOSIS — I959 Hypotension, unspecified: Secondary | ICD-10-CM | POA: Diagnosis present

## 2016-05-10 DIAGNOSIS — F329 Major depressive disorder, single episode, unspecified: Secondary | ICD-10-CM | POA: Diagnosis present

## 2016-05-10 DIAGNOSIS — Z79899 Other long term (current) drug therapy: Secondary | ICD-10-CM | POA: Diagnosis not present

## 2016-05-10 DIAGNOSIS — A498 Other bacterial infections of unspecified site: Secondary | ICD-10-CM | POA: Diagnosis not present

## 2016-05-10 DIAGNOSIS — F17210 Nicotine dependence, cigarettes, uncomplicated: Secondary | ICD-10-CM | POA: Diagnosis present

## 2016-05-10 DIAGNOSIS — M810 Age-related osteoporosis without current pathological fracture: Secondary | ICD-10-CM | POA: Diagnosis present

## 2016-05-10 DIAGNOSIS — I10 Essential (primary) hypertension: Secondary | ICD-10-CM | POA: Diagnosis present

## 2016-05-10 DIAGNOSIS — I951 Orthostatic hypotension: Secondary | ICD-10-CM | POA: Diagnosis not present

## 2016-05-10 HISTORY — DX: Elevated white blood cell count, unspecified: D72.829

## 2016-05-10 HISTORY — DX: Sepsis, unspecified organism: A41.9

## 2016-05-10 LAB — BASIC METABOLIC PANEL
Anion gap: 4 — ABNORMAL LOW (ref 5–15)
BUN: 12 mg/dL (ref 6–20)
CO2: 25 mmol/L (ref 22–32)
Calcium: 8.1 mg/dL — ABNORMAL LOW (ref 8.9–10.3)
Chloride: 107 mmol/L (ref 101–111)
Creatinine, Ser: 0.71 mg/dL (ref 0.44–1.00)
GFR calc Af Amer: >60 mL/min (ref >60)
GLUCOSE: 113 mg/dL — AB (ref 65–99)
POTASSIUM: 3.4 mmol/L — AB (ref 3.5–5.1)
Sodium: 136 mmol/L (ref 135–145)

## 2016-05-10 LAB — CBC
HEMATOCRIT: 28.9 % — AB (ref 36.0–46.0)
HEMOGLOBIN: 10.1 g/dL — AB (ref 12.0–15.0)
MCH: 31.4 pg (ref 26.0–34.0)
MCHC: 34.9 g/dL (ref 30.0–36.0)
MCV: 89.8 fL (ref 78.0–100.0)
Platelets: 265 10*3/uL (ref 150–400)
RBC: 3.22 MIL/uL — ABNORMAL LOW (ref 3.87–5.11)
RDW: 12.8 % (ref 11.5–15.5)
WBC: 12.2 10*3/uL — ABNORMAL HIGH (ref 4.0–10.5)

## 2016-05-10 LAB — TROPONIN I
TROPONIN I: 0.06 ng/mL — AB (ref <0.03)
Troponin I: 0.08 ng/mL (ref <0.03)

## 2016-05-10 MED ORDER — ONDANSETRON HCL 4 MG/2ML IJ SOLN
4.0000 mg | Freq: Four times a day (QID) | INTRAMUSCULAR | Status: DC | PRN
  Administered 2016-05-10: 4 mg via INTRAVENOUS
  Filled 2016-05-10: qty 2

## 2016-05-10 NOTE — Telephone Encounter (Signed)
Perfect

## 2016-05-10 NOTE — Telephone Encounter (Signed)
Patient currently hospitalized, is on fluconazole 400 mg daily per notes. Landis Gandy, RN

## 2016-05-10 NOTE — Progress Notes (Signed)
[facility] PROGRESS NOTE    Progress Note  Amy Hamilton  JME:[id number] DOB: [birth year] DOA: 05/09/2016 PCP: Harvie Junior, MD     Brief Narrative:   Amy Hamilton is an 65 y.o. female past medical history of hypothyroidism, osteoporosis, and right ankle osteomyelitis, status post IV antibiotics and hardware removal, the presents with generalized weakness and fatigue, and hypotension, and reports she has been feeling weak over last 48 hours, and feeling lightheaded and dizzy, checked by family friends where she was found to be hypotensive with systolic blood pressure in the 60s, the same by EMS who gave her 500 mL fluid bolus.  Assessment/Plan:   Sepsis with hypotension and leukocytosis likely due to urinary tract infection: Started empirically on IV Rocephin on 05/09/2016, she responded to fluid resuscitation Her leukocytosis is also improved. She continues to have  fever, continue Tylenol. CT scan of the abdomen and pelvis does not show any acute pathology. Blood cultures 2 are pending, urine cultures are pending.  Abdominal pain, acute: Appendix not visualized is responded to IV Rocephin question is due to urinary tract infection.  Osteomyelitis, 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[place]) Patient just finished a 6 week course of IV antibiotics now on oral factors shows an consistently giving her nausea. The case was discussed with Dr. Tommy Medal and her wound culture actually grew candidacy she was started on fluconazole 400 mg daily  Elevated troponin Unlikely acute coronary syndrome, she denies any chest condition is of breath likely demand ischemia from sepsis.  Hypothyroidism: Continue Synthroid.    DVT prophylaxis: lovenox Family Communication:none Disposition Plan/Barrier to D/C: unable to detemrine Code Status:     Code Status Orders        Start     Ordered   05/09/16 1827  Full code  Continuous     05/09/16 1826    Code Status History    Date Active Date Inactive  Code Status Order ID Comments User Context   01/15/2015  8:00 AM 01/15/2015 12:23 PM Full Code [id number]  Milly Jakob, MD Inpatient   01/06/2015  1:37 AM 01/07/2015  9:49 PM Full Code [id number]  Lavina Hamman, MD ED   01/05/2015  6:10 AM 01/05/2015  9:30 AM Full Code [id number]  Milly Jakob, MD Inpatient   10/18/2012  9:46 PM 10/22/2012  8:30 PM Full Code [id number]  Meredith Pel, MD Inpatient   10/17/2012  9:09 PM 10/18/2012  9:46 PM Full Code [id number]  Ripudeep Krystal Eaton, MD Inpatient        IV Access:    Peripheral IV   Procedures and diagnostic studies:   Dg Chest 2 View  Result Date: 05/09/2016 CLINICAL DATA:  Cough productive of yellow sputum for 3 weeks, hypotension, smoker EXAM: CHEST  2 VIEW COMPARISON:  01/05/2015 FINDINGS: Normal heart size and pulmonary vascularity. Atherosclerotic calcification aorta. Small hiatal hernia. Lungs emphysematous but clear. No pleural effusion or pneumothorax. Bones demineralized with old healed posttraumatic deformity of the proximal LEFT humerus. Levoconvex thoracolumbar scoliosis. IMPRESSION: Emphysematous changes question COPD. No acute infiltrate. Small hiatal hernia. Electronically Signed   By: Lavonia Dana M.D.   On: 05/09/2016 15:29   Ct Abdomen Pelvis W Contrast  Result Date: 05/09/2016 CLINICAL DATA:  Abdominal pain EXAM: CT ABDOMEN AND PELVIS WITH CONTRAST TECHNIQUE: Multidetector CT imaging of the abdomen and pelvis was performed using the standard protocol following bolus administration of intravenous contrast. CONTRAST:  48m ISOVUE-300 IOPAMIDOL (ISOVUE-300) INJECTION 61%, 331mISOVUE-300 IOPAMIDOL (ISOVUE-300) INJECTION  61% COMPARISON:  CT abdomen pelvis 01/06/2015 FINDINGS: Lower chest: Lung bases clear.  Moderate hiatal hernia. Hepatobiliary: Cholelithiasis. No gallbladder wall thickening. Bile ducts nondilated. Normal liver. Pancreas: Negative Spleen: Negative Adrenals/Urinary Tract: Symmetric excretion of contrast by both kidneys  without obstruction. No renal calculi. 3 cm right renal cyst laterally. Normal urinary bladder. Stomach/Bowel: Stomach and duodenum normal. Hiatal hernia. Negative for bowel obstruction. No bowel mass or edema. Appendix not discretely visualized but no evidence of inflammation in the right lower quadrant. Vascular/Lymphatic: Atherosclerotic calcification without aneurysm. No adenopathy. Reproductive: Hysterectomy.  No pelvic mass. Other: No free fluid or free air.  Negative for hernia. Musculoskeletal: Moderate dextroscoliosis with multilevel lumbar degenerative change. No acute fracture. Left hip fracture fixation. IMPRESSION: Cholelithiasis without evidence of cholecystitis Hiatal hernia Atherosclerotic disease without aortic aneurysm No acute abnormality identified.  Appendix not visualized. Electronically Signed   By: Franchot Gallo M.D.   On: 05/09/2016 18:18     Medical Consultants:    None.  Anti-Infectives:   Rocephin  Subjective:    Amy Hamilton she relates she feels better, still having abdominal pain.  Objective:    Vitals:   05/09/16 1823 05/09/16 2222 05/09/16 2313 05/10/16 0611  BP: 132/68 140/82 132/65 111/71  Pulse: 79 77 77 (!) 59  Resp: '16 18  18  '$ Temp: 98.7 F (37.1 C) (!) 102.5 F (39.2 C) (!) 100.4 F (38 C) 98.8 F (37.1 C)  TempSrc: Oral Oral Oral Oral  SpO2: 96% 98% 96% 97%  Weight:      Height:        Intake/Output Summary (Last 24 hours) at 05/10/16 0813 Last data filed at 05/10/16 0600  Gross per 24 hour  Intake           2127.5 ml  Output                0 ml  Net           2127.5 ml   Filed Weights   05/09/16 1316 05/09/16 1359  Weight: 49.9 kg (110 lb) 49.9 kg (110 lb)    Exam: General exam: In no acute distress. Respiratory system: Good air movement and clear to auscultation. Cardiovascular system: S1 & S2 heard, RRR. No JVD Gastrointestinal system: Abdomen is nondistended, soft and nontender.  Central nervous system: Alert and  oriented. No focal neurological deficits. Extremities: No pedal edema. Skin: No rashes, lesions or ulcers   Data Reviewed:    Labs: Basic Metabolic Panel:  Recent Labs Lab 05/09/16 1334 05/10/16 0505  NA 132* 136  K 3.7 3.4*  CL 100* 107  CO2 24 25  GLUCOSE 125* 113*  BUN 17 12  CREATININE 0.85 0.71  CALCIUM 8.2* 8.1*   GFR Estimated Creatinine Clearance: 56 mL/min (by C-G formula based on SCr of 0.71 mg/dL). Liver Function Tests:  Recent Labs Lab 05/09/16 1334  AST 24  ALT 16  ALKPHOS 66  BILITOT 0.4  PROT 6.4*  ALBUMIN 3.5   No results for input(s): LIPASE, AMYLASE in the last 168 hours. No results for input(s): AMMONIA in the last 168 hours. Coagulation profile No results for input(s): INR, PROTIME in the last 168 hours.  CBC:  Recent Labs Lab 05/09/16 1334 05/10/16 0505  WBC 15.5* 12.2*  NEUTROABS 14.1*  --   HGB 10.4* 10.1*  HCT 30.3* 28.9*  MCV 87.6 89.8  PLT 286 265   Cardiac Enzymes:  Recent Labs Lab 05/09/16 1445 05/09/16 1713 05/09/16 2306 05/10/16  0505  TROPONINI 0.13* 0.12* 0.08* 0.06*   BNP (last 3 results) No results for input(s): PROBNP in the last 8760 hours. CBG: No results for input(s): GLUCAP in the last 168 hours. D-Dimer: No results for input(s): DDIMER in the last 72 hours. Hgb A1c: No results for input(s): HGBA1C in the last 72 hours. Lipid Profile: No results for input(s): CHOL, HDL, LDLCALC, TRIG, CHOLHDL, LDLDIRECT in the last 72 hours. Thyroid function studies: No results for input(s): TSH, T4TOTAL, T3FREE, THYROIDAB in the last 72 hours.  Invalid input(s): FREET3 Anemia work up: No results for input(s): VITAMINB12, FOLATE, FERRITIN, TIBC, IRON, RETICCTPCT in the last 72 hours. Sepsis Labs:  Recent Labs Lab 05/09/16 1334 05/09/16 1343 05/09/16 1734 05/10/16 0505  WBC 15.5*  --   --  12.2*  LATICACIDVEN  --  1.61 0.79  --    Microbiology No results found for this or any previous visit (from the  past 240 hour(s)).   Medications:   . aspirin EC  81 mg Oral Daily  . cefTRIAXone (ROCEPHIN)  IV  1 g Intravenous Q24H  . feeding supplement (ENSURE ENLIVE)  237 mL Oral BID BM  . fluconazole  400 mg Oral QHS  . FLUoxetine  80 mg Oral Daily  . heparin  5,000 Units Subcutaneous Q8H  . levothyroxine  88 mcg Oral QAC breakfast  . sodium chloride flush  3 mL Intravenous Q12H   Continuous Infusions: . sodium chloride 75 mL/hr at 05/10/16 0525    Time spent: 25 min   LOS: 0 days   Charlynne Cousins  [facility] Pager [phone]  *Please refer to [email or web address], password [email or web address] to get updated schedule on who will round on this patient, as hospitalists switch teams weekly. If 7PM-7AM, please contact night-coverage at [email or web address], password [email or web address] for any overnight needs.  05/10/2016, 8:13 AM

## 2016-05-10 NOTE — Progress Notes (Signed)
Initial Nutrition Assessment  INTERVENTION:   Continue Ensure Enlive po BID, each supplement provides 350 kcal and 20 grams of protein Encourage PO intake RD to continue to monitor  NUTRITION DIAGNOSIS:   Unintentional weight loss related to other (see comment) (abdominal pain) as evidenced by other (see comment) (pt report of 40 lb over the past year).  GOAL:   Patient will meet greater than or equal to 90% of their needs  MONITOR:   PO intake, Supplement acceptance, Labs, Weight trends, I & O's  REASON FOR ASSESSMENT:   Malnutrition Screening Tool    ASSESSMENT:   65 y.o. female past medical history of hypothyroidism, osteoporosis, and right ankle osteomyelitis, status post IV antibiotics and hardware removal, the presents with generalized weakness and fatigue, and hypotension, and reports she has been feeling weak over last 48 hours, and feeling lightheaded and dizzy, checked by family friends where she was found to be hypotensive with systolic blood pressure in the 60s, the same by EMS who gave her 500 mL fluid bolus.  Patient in room. She had just received her lunch tray and seemed eager to begin eating. Pt states she ate 1/2 bacon sandwich last night. She has not eaten anything this morning because of stomach pain. Pt reports she has had unintentional weight loss over the past year. She reports 40 lb of weight loss over a year's time. Chart review shows her weight has remained around 115 lb since 2016. She does then state her UBW is 110 lb "for a few months".  Patient is receiving Ensure supplements and is willing to continue them. Unable to perform NFPE d/t patient wanting to eat her lunch. Will attempt at follow-up.  Medications: Zofran PRN Labs reviewed: Low K  Diet Order:  Diet regular Room service appropriate? Yes; Fluid consistency: Thin  Skin:  Reviewed, no issues  Last BM:  10/16  Height:   Ht Readings from Last 1 Encounters:  05/09/16 '5\' 3"'$  (1.6 m)     Weight:   Wt Readings from Last 1 Encounters:  05/09/16 110 lb (49.9 kg)    Ideal Body Weight:  52.3 kg  BMI:  Body mass index is 19.49 kg/m.  Estimated Nutritional Needs:   Kcal:  1400-1600  Protein:  60-70g  Fluid:  1.6L/day  EDUCATION NEEDS:   No education needs identified at this time  Clayton Bibles, MS, RD, LDN Pager: [phone] After Hours Pager: [phone]

## 2016-05-11 ENCOUNTER — Inpatient Hospital Stay (HOSPITAL_COMMUNITY): Payer: Medicaid Other

## 2016-05-11 DIAGNOSIS — R079 Chest pain, unspecified: Secondary | ICD-10-CM

## 2016-05-11 DIAGNOSIS — Z1612 Extended spectrum beta lactamase (ESBL) resistance: Secondary | ICD-10-CM

## 2016-05-11 DIAGNOSIS — M869 Osteomyelitis, unspecified: Secondary | ICD-10-CM

## 2016-05-11 DIAGNOSIS — N390 Urinary tract infection, site not specified: Secondary | ICD-10-CM

## 2016-05-11 DIAGNOSIS — R748 Abnormal levels of other serum enzymes: Secondary | ICD-10-CM

## 2016-05-11 DIAGNOSIS — A498 Other bacterial infections of unspecified site: Secondary | ICD-10-CM

## 2016-05-11 DIAGNOSIS — B9629 Other Escherichia coli [E. coli] as the cause of diseases classified elsewhere: Secondary | ICD-10-CM

## 2016-05-11 DIAGNOSIS — A419 Sepsis, unspecified organism: Principal | ICD-10-CM

## 2016-05-11 HISTORY — DX: Urinary tract infection, site not specified: B96.29

## 2016-05-11 HISTORY — DX: Urinary tract infection, site not specified: N39.0

## 2016-05-11 LAB — ECHOCARDIOGRAM COMPLETE
Height: 63 in
Weight: 1760 oz

## 2016-05-11 LAB — URINE CULTURE

## 2016-05-11 MED ORDER — SODIUM CHLORIDE 0.9 % IV SOLN
1.0000 g | Freq: Three times a day (TID) | INTRAVENOUS | Status: DC
  Administered 2016-05-12 – 2016-05-13 (×4): 1 g via INTRAVENOUS
  Filled 2016-05-11 (×4): qty 1

## 2016-05-11 MED ORDER — ENOXAPARIN SODIUM 40 MG/0.4ML ~~LOC~~ SOLN
40.0000 mg | SUBCUTANEOUS | Status: DC
  Administered 2016-05-11 – 2016-05-12 (×2): 40 mg via SUBCUTANEOUS
  Filled 2016-05-11 (×2): qty 0.4

## 2016-05-11 MED ORDER — SODIUM CHLORIDE 0.9 % IV SOLN
1.0000 g | INTRAVENOUS | Status: DC
  Administered 2016-05-11: 1 g via INTRAVENOUS
  Filled 2016-05-11: qty 1

## 2016-05-11 MED ORDER — IPRATROPIUM-ALBUTEROL 0.5-2.5 (3) MG/3ML IN SOLN
3.0000 mL | Freq: Three times a day (TID) | RESPIRATORY_TRACT | Status: DC
Start: 2016-05-11 — End: 2016-05-11

## 2016-05-11 MED ORDER — IPRATROPIUM-ALBUTEROL 0.5-2.5 (3) MG/3ML IN SOLN
3.0000 mL | Freq: Two times a day (BID) | RESPIRATORY_TRACT | Status: DC
  Administered 2016-05-11 – 2016-05-12 (×3): 3 mL via RESPIRATORY_TRACT
  Filled 2016-05-11 (×4): qty 3

## 2016-05-11 MED ORDER — NICOTINE 7 MG/24HR TD PT24
7.0000 mg | MEDICATED_PATCH | Freq: Every day | TRANSDERMAL | Status: DC
  Administered 2016-05-11 – 2016-05-13 (×3): 7 mg via TRANSDERMAL
  Filled 2016-05-11 (×3): qty 1

## 2016-05-11 NOTE — Progress Notes (Signed)
  Echocardiogram 2D Echocardiogram has been performed.  Amy Hamilton 05/11/2016, 1:25 PM

## 2016-05-11 NOTE — Progress Notes (Addendum)
PROGRESS NOTE  Amy Hamilton  JJO:[id number] DOB: 08/26/1950 DOA: 05/09/2016 PCP: Harvie Junior, MD  Brief Narrative:   Amy Hamilton is an 65 y.o. female past medical history of hypothyroidism, osteoporosis, and right ankle osteomyelitis, status post IV antibiotics and hardware removal, the presents with generalized weakness and fatigue, and hypotension, and reports she has been feeling weak over last 48 hours, and feeling lightheaded and dizzy, checked by family friends where she was found to be hypotensive with systolic blood pressure in the 60s, the same by EMS who gave her 500 mL fluid bolus.  Urine culture grew ESBL Escherichia coli.  Assessment & Plan:   Principal Problem:   Urinary tract infection due to extended-spectrum beta lactamase (ESBL) producing Escherichia coli Active Problems:   Abdominal pain, acute   Osteomyelitis, 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[facility])   Elevated troponin   Hypotension   Sepsis ([facility])   Leukocytosis   Sepsis with hypotension and leukocytosis likely due to ESBL Escherichia coli UTI, present at time of admission -  DC ceftriaxone -  Start meropenem -  Case discussed with infectious disease, Dr. Tommy Medal recommends 3 days of IV meropenem followed by one time dose of fosfomycin -  Follow-up with infectious disease within 2 weeks of discharge -  Continues to have intermittent fevers to greater than 103F CT scan of the abdomen and pelvis does not show any acute pathology. Blood cultures 2 no growth to date  Abdominal pain, acute, likely secondary to UTI and improving  Osteomyelitis, 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[place]) Patient just finished a 6 week course of IV antibiotics, however, her wound culture eventually grew Candida -  Started on fluconazole 400 mg daily  Elevated troponin, trending down Unlikely acute coronary syndrome, she denies any chest condition is of breath likely demand ischemia from sepsis. - Echo:  Mild LVH, normal systolic function with ejection  fraction of 60-65 percent, normal left ventricular diastolic function.  Hypothyroidism: Continue Synthroid.  COPD, probable diagnosis given ongoing wheezing and smoking history -  Start duo nebs -  Ordered nurse to provide smoking cessation counseling -  Nicotine patch -  Will need outpatient PFTs  DVT prophylaxis:  Lovenox Code Status:  Full code Family Communication:  Patient and her husband who was at bedside, questions answered Disposition Plan:  Home once fever improving. Potentially she would stay in the hospital for 3 days to receive IV meropenem, however she if she has quick improvement, could consider doing IM injections of ertapenem for the next couple of days followed by one time dose of Fosfomycin as an outpatient   Consultants:   Spoke with Dr. Tommy Medal by phone, infectious disease  Procedures:  None  Antimicrobials:   Ceftriaxone discontinued on 10/18  Day 1 on 10/18 ertapenem  Start meropenem on 10/19 per formulary preference  Plan to dose fosfomycin on 10/21   Subjective: Still feeling very unwell. Having frequent chills. Had a fever 103 Fahrenheit last night. Abdominal pain is marginally improved. Still feels very tired. Having a little bit of wheezing, cough is nonproductive. Toe on her right foot is doing okay.  Objective: Vitals:   05/10/16 2121 05/11/16 0442 05/11/16 0559 05/11/16 0840  BP:  (!) 175/79  137/88  Pulse:  76  66  Resp:  18  17  Temp: 99.6 F (37.6 C) 100.2 F (37.9 C)  97.9 F (36.6 C)  TempSrc: Oral Oral  Oral  SpO2:  94% 94% 96%  Weight:  Height:        Intake/Output Summary (Last 24 hours) at 05/11/16 1329 Last data filed at 05/11/16 1210  Gross per 24 hour  Intake          2893.25 ml  Output             2800 ml  Net            93.25 ml   Filed Weights   05/09/16 1316 05/09/16 1359  Weight: 49.9 kg (110 lb) 49.9 kg (110 lb)    Examination:  General exam:  Adult Female.  Frequent wheezy cough  HEENT:  NCAT,  MMM Respiratory system:  Diminished bilateral breath sounds with full expiratory wheeze throughout, no focal rales no rhonchi Cardiovascular system: Regular rate and rhythm, normal S1/S2. No murmurs, rubs, gallops or clicks.  Warm extremities Gastrointestinal system: Normal active bowel sounds, soft, nondistended, nontender. MSK:  Normal tone and bulk, no lower extremity edema Neuro:  Grossly intact    Data Reviewed: I have personally reviewed following labs and imaging studies  CBC:  Recent Labs Lab 05/09/16 1334 05/10/16 0505  WBC 15.5* 12.2*  NEUTROABS 14.1*  --   HGB 10.4* 10.1*  HCT 30.3* 28.9*  MCV 87.6 89.8  PLT 286 170   Basic Metabolic Panel:  Recent Labs Lab 05/09/16 1334 05/10/16 0505  NA 132* 136  K 3.7 3.4*  CL 100* 107  CO2 24 25  GLUCOSE 125* 113*  BUN 17 12  CREATININE 0.85 0.71  CALCIUM 8.2* 8.1*   GFR: Estimated Creatinine Clearance: 56 mL/min (by C-G formula based on SCr of 0.71 mg/dL). Liver Function Tests:  Recent Labs Lab 05/09/16 1334  AST 24  ALT 16  ALKPHOS 66  BILITOT 0.4  PROT 6.4*  ALBUMIN 3.5   No results for input(s): LIPASE, AMYLASE in the last 168 hours. No results for input(s): AMMONIA in the last 168 hours. Coagulation Profile: No results for input(s): INR, PROTIME in the last 168 hours. Cardiac Enzymes:  Recent Labs Lab 05/09/16 1445 05/09/16 1713 05/09/16 2306 05/10/16 0505  TROPONINI 0.13* 0.12* 0.08* 0.06*   BNP (last 3 results) No results for input(s): PROBNP in the last 8760 hours. HbA1C: No results for input(s): HGBA1C in the last 72 hours. CBG: No results for input(s): GLUCAP in the last 168 hours. Lipid Profile: No results for input(s): CHOL, HDL, LDLCALC, TRIG, CHOLHDL, LDLDIRECT in the last 72 hours. Thyroid Function Tests: No results for input(s): TSH, T4TOTAL, FREET4, T3FREE, THYROIDAB in the last 72 hours. Anemia Panel: No results for input(s): VITAMINB12, FOLATE, FERRITIN, TIBC, IRON,  RETICCTPCT in the last 72 hours. Urine analysis:    Component Value Date/Time   COLORURINE YELLOW 05/09/2016 1435   APPEARANCEUR CLOUDY (A) 05/09/2016 1435   LABSPEC 1.010 05/09/2016 1435   PHURINE 6.0 05/09/2016 1435   GLUCOSEU NEGATIVE 05/09/2016 1435   HGBUR MODERATE (A) 05/09/2016 1435   BILIRUBINUR NEGATIVE 05/09/2016 1435   KETONESUR NEGATIVE 05/09/2016 1435   PROTEINUR NEGATIVE 05/09/2016 1435   UROBILINOGEN 0.2 07/08/2013 1156   NITRITE POSITIVE (A) 05/09/2016 1435   LEUKOCYTESUR LARGE (A) 05/09/2016 1435   Sepsis Labs: '@LABRCNTIP'$ (procalcitonin:4,lacticidven:4)  ) Recent Results (from the past 240 hour(s))  Urine culture     Status: Abnormal   Collection Time: 05/09/16  2:38 PM  Result Value Ref Range Status   Specimen Description URINE, RANDOM  Final   Special Requests NONE  Final   Culture (A)  Final    >=100,000 COLONIES/mL  ESCHERICHIA COLI Confirmed Extended Spectrum Beta-Lactamase Producer (ESBL) Performed at [facility]    Report Status 05/11/2016 FINAL  Final   Organism ID, Bacteria ESCHERICHIA COLI (A)  Final      Susceptibility   Escherichia coli - MIC*    AMPICILLIN >=32 RESISTANT Resistant     CEFAZOLIN >=64 RESISTANT Resistant     CEFTRIAXONE >=64 RESISTANT Resistant     CIPROFLOXACIN >=4 RESISTANT Resistant     GENTAMICIN <=1 SENSITIVE Sensitive     IMIPENEM <=0.25 SENSITIVE Sensitive     NITROFURANTOIN <=16 SENSITIVE Sensitive     TRIMETH/SULFA >=320 RESISTANT Resistant     AMPICILLIN/SULBACTAM >=32 RESISTANT Resistant     PIP/TAZO 64 INTERMEDIATE Intermediate     Extended ESBL POSITIVE Resistant     * >=100,000 COLONIES/mL ESCHERICHIA COLI  Culture, blood (routine x 2)     Status: None (Preliminary result)   Collection Time: 05/09/16  2:45 PM  Result Value Ref Range Status   Specimen Description BLOOD LEFT ANTECUBITAL  Final   Special Requests BOTTLES DRAWN AEROBIC AND ANAEROBIC 5ML  Final   Culture   Final    NO GROWTH < 24  HOURS Performed at [facility]    Report Status PENDING  Incomplete  Culture, blood (routine x 2)     Status: None (Preliminary result)   Collection Time: 05/09/16  2:50 PM  Result Value Ref Range Status   Specimen Description BLOOD RIGHT ANTECUBITAL  Final   Special Requests BOTTLES DRAWN AEROBIC AND ANAEROBIC 5ML  Final   Culture   Final    NO GROWTH < 24 HOURS Performed at [facility]    Report Status PENDING  Incomplete      Radiology Studies: Dg Chest 2 View  Result Date: 05/09/2016 CLINICAL DATA:  Cough productive of yellow sputum for 3 weeks, hypotension, smoker EXAM: CHEST  2 VIEW COMPARISON:  01/05/2015 FINDINGS: Normal heart size and pulmonary vascularity. Atherosclerotic calcification aorta. Small hiatal hernia. Lungs emphysematous but clear. No pleural effusion or pneumothorax. Bones demineralized with old healed posttraumatic deformity of the proximal LEFT humerus. Levoconvex thoracolumbar scoliosis. IMPRESSION: Emphysematous changes question COPD. No acute infiltrate. Small hiatal hernia. Electronically Signed   By: Lavonia Dana M.D.   On: 05/09/2016 15:29   Ct Abdomen Pelvis W Contrast  Result Date: 05/09/2016 CLINICAL DATA:  Abdominal pain EXAM: CT ABDOMEN AND PELVIS WITH CONTRAST TECHNIQUE: Multidetector CT imaging of the abdomen and pelvis was performed using the standard protocol following bolus administration of intravenous contrast. CONTRAST:  26m ISOVUE-300 IOPAMIDOL (ISOVUE-300) INJECTION 61%, 348mISOVUE-300 IOPAMIDOL (ISOVUE-300) INJECTION 61% COMPARISON:  CT abdomen pelvis 01/06/2015 FINDINGS: Lower chest: Lung bases clear.  Moderate hiatal hernia. Hepatobiliary: Cholelithiasis. No gallbladder wall thickening. Bile ducts nondilated. Normal liver. Pancreas: Negative Spleen: Negative Adrenals/Urinary Tract: Symmetric excretion of contrast by both kidneys without obstruction. No renal calculi. 3 cm right renal cyst laterally. Normal urinary bladder.  Stomach/Bowel: Stomach and duodenum normal. Hiatal hernia. Negative for bowel obstruction. No bowel mass or edema. Appendix not discretely visualized but no evidence of inflammation in the right lower quadrant. Vascular/Lymphatic: Atherosclerotic calcification without aneurysm. No adenopathy. Reproductive: Hysterectomy.  No pelvic mass. Other: No free fluid or free air.  Negative for hernia. Musculoskeletal: Moderate dextroscoliosis with multilevel lumbar degenerative change. No acute fracture. Left hip fracture fixation. IMPRESSION: Cholelithiasis without evidence of cholecystitis Hiatal hernia Atherosclerotic disease without aortic aneurysm No acute abnormality identified.  Appendix not visualized. Electronically Signed   By: ChJuanda Crumble  Carlis Abbott M.D.   On: 05/09/2016 18:18     Scheduled Meds: . aspirin EC  81 mg Oral Daily  . feeding supplement (ENSURE ENLIVE)  237 mL Oral BID BM  . fluconazole  400 mg Oral QHS  . FLUoxetine  80 mg Oral Daily  . heparin  5,000 Units Subcutaneous Q8H  . levothyroxine  88 mcg Oral QAC breakfast  . [START ON 05/12/2016] meropenem (MERREM) IV  1 g Intravenous Q8H  . sodium chloride flush  3 mL Intravenous Q12H   Continuous Infusions: . sodium chloride 75 mL/hr at 05/11/16 0609     LOS: 1 day    Time spent: 30 min    Janece Canterbury, MD [facility] Pager [phone]  If 7PM-7AM, please contact night-coverage [email or web address] Password [email or web address] 05/11/2016, 1:29 PM

## 2016-05-11 NOTE — Progress Notes (Signed)
Pharmacy Antibiotic Note  Amy Hamilton is a 65 y.o. female admitted on 05/09/2016 with UTI ESBL E.coli.  Pharmacy has been consulted for meropenem dosing.  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   Plan: Meropenem 1 gr IV q8h  Height: '5\' 3"'$  (160 cm) Weight: 110 lb (49.9 kg) IBW/kg (Calculated) : 52.4  Temp (24hrs), Avg:99.9 F (37.7 C), Min:97.9 F (36.6 C), Max:103.2 F (39.6 C)   Recent Labs Lab 05/09/16 1334 05/09/16 1343 05/09/16 1734 05/10/16 0505  WBC 15.5*  --   --  12.2*  CREATININE 0.85  --   --  0.71  LATICACIDVEN  --  1.61 0.79  --     Estimated Creatinine Clearance: 56 mL/min (by C-G formula based on SCr of 0.71 mg/dL).    No Known Allergies  Antimicrobials this admission: 10/16 ceftriaxone >> 10/18 10/18 ertapenem >> 10/18 ( received one dose) 10/19 meropenem >>   Osteo - Candida parapsilosis  Dose adjustments this admission: --  Microbiology results: 10/16 BCx: NGTD 10/16 UCx: ESBL E.Coli    Thank you for allowing pharmacy to be a part of this patient's care.  Royetta Asal, PharmD, BCPS Pager [phone] 05/11/2016 12:09 PM

## 2016-05-11 NOTE — Consult Note (Signed)
ANTICOAGULATION CONSULT NOTE - Initial Consult  Pharmacy Consult for enoxaparin Indication: VTE prophylaxis  No Known Allergies  Patient Measurements: Height: '5\' 3"'$  (160 cm) Weight: 110 lb (49.9 kg) IBW/kg (Calculated) : 52.4 Heparin Dosing Weight:   Vital Signs: Temp: 97.9 F (36.6 C) (10/18 0840) Temp Source: Oral (10/18 0840) BP: 137/88 (10/18 0840) Pulse Rate: 66 (10/18 0840)  Labs:  Recent Labs  05/09/16 1334  05/09/16 1713 05/09/16 2306 05/10/16 0505  HGB 10.4*  --   --   --  10.1*  HCT 30.3*  --   --   --  28.9*  PLT 286  --   --   --  265  CREATININE 0.85  --   --   --  0.71  TROPONINI  --   < > 0.12* 0.08* 0.06*  < > = values in this interval not displayed.  Estimated Creatinine Clearance: 56 mL/min (by C-G formula based on SCr of 0.71 mg/dL).   Medical History: Past Medical History:  Diagnosis Date  . Chronic back pain   . DDD (degenerative disc disease), cervical   . Depression   . Hardware complicating wound infection ([place]) 02/02/2016  . Osteomyelitis, 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[place]) 02/02/2016  . Routine screening for STI (sexually transmitted infection) 02/02/2016  . Thyroid disease   . Uterine prolapse     Medications:  Scheduled:  . aspirin EC  81 mg Oral Daily  . enoxaparin (LOVENOX) injection  40 mg Subcutaneous Q24H  . feeding supplement (ENSURE ENLIVE)  237 mL Oral BID BM  . fluconazole  400 mg Oral QHS  . FLUoxetine  80 mg Oral Daily  . ipratropium-albuterol  3 mL Nebulization BID  . levothyroxine  88 mcg Oral QAC breakfast  . [START ON 05/12/2016] meropenem (MERREM) IV  1 g Intravenous Q8H  . nicotine  7 mg Transdermal Daily  . sodium chloride flush  3 mL Intravenous Q12H    Assessment: Pharmacy is consulted to dose enoxaparin for 65 yo female pt. Pt was previously on heparin 5000 units sq q8h  Goal of Therapy:  Anti-Xa level 0.6-1 units/ml 4hrs after LMWH dose given Monitor platelets by anticoagulation protocol: Yes   Plan:  Lovenox 40  mg SQ q24h Monitor for signs and symptoms of bleeding  Royetta Asal, PharmD, BCPS Pager [phone] 05/11/2016 1:53 PM

## 2016-05-12 DIAGNOSIS — R109 Unspecified abdominal pain: Secondary | ICD-10-CM

## 2016-05-12 MED ORDER — TRAMADOL HCL 50 MG PO TABS
50.0000 mg | ORAL_TABLET | Freq: Four times a day (QID) | ORAL | Status: DC | PRN
  Administered 2016-05-12 (×2): 50 mg via ORAL
  Filled 2016-05-12 (×2): qty 1

## 2016-05-12 NOTE — Progress Notes (Signed)
PROGRESS NOTE  Amy Hamilton  QMV:[id number] DOB: 01-15-1951 DOA: 05/09/2016 PCP: Harvie Junior, MD  Brief Narrative:   Amy Hamilton is an 65 y.o. female past medical history of hypothyroidism, osteoporosis, and right ankle osteomyelitis, status post IV antibiotics and hardware removal, the presents with generalized weakness and fatigue, and hypotension, and reports she has been feeling weak over last 48 hours, and feeling lightheaded and dizzy, checked by family friends where she was found to be hypotensive with systolic blood pressure in the 60s, the same by EMS who gave her 500 mL fluid bolus.  Urine culture grew ESBL Escherichia coli.  Assessment & Plan:   Principal Problem:   Urinary tract infection due to extended-spectrum beta lactamase (ESBL) producing Escherichia coli Active Problems:   Abdominal pain, acute   Osteomyelitis, 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HCC)   Elevated troponin   Hypotension   Sepsis (HCC)   Leukocytosis   Sepsis with hypotension and leukocytosis likely due to ESBL Escherichia coli UTI, present at time of admission -  Continue meropenem -  Case discussed with infectious disease, Dr. Tommy Medal recommends 3 days of IV meropenem followed by one time dose of fosfomycin -  Follow-up with infectious disease within 2 weeks of discharge -  Fevers trending down -  CT scan of the abdomen and pelvis does not show any acute pathology. -  Blood cultures 2 no growth to date  Abdominal and back pain, acute, likely secondary to UTI and improving -  Intended Tylenol, Motrin -  24 hours of Ultram when necessary  Osteomyelitis, 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[place]) Patient just finished a 6 week course of IV antibiotics, however, her wound culture eventually grew Candida -  Started on fluconazole 400 mg daily  Elevated troponin, trending down Unlikely acute coronary syndrome, she denies any chest condition is of breath likely demand ischemia from sepsis. - Echo:  Mild LVH, normal systolic  function with ejection fraction of 60-65 percent, normal left ventricular diastolic function.  Hypothyroidism: Continue Synthroid.  COPD, probable diagnosis given ongoing wheezing and smoking history -  Start duo nebs -  Ordered nurse to provide smoking cessation counseling -  Nicotine patch -  Will need outpatient PFTs  DVT prophylaxis:  Lovenox Code Status:  Full code Family Communication:  Patient, questions answered Disposition Plan:  Continue IV meropenem.  If patient feels clinically improved tomorrow, she may have a dose of fosfomycin and be discharged. If she is still feeling unwell, poor appetite, ongoing fevers, will continue meropenem and attempt discharge with fosfomycin on 10/21.   Consultants:   Spoke with Dr. Tommy Medal by phone, infectious disease  Procedures:  None  Antimicrobials:   Ceftriaxone discontinued on 10/18  Day 1 on 10/18 ertapenem  Start meropenem on 10/19 per formulary preference  Plan to dose fosfomycin on 10/21   Subjective: Feels worse than yesterday.  Does not have much appetite or energy. Still having chills although she has not had recent high fevers. She is having worsening back pain particularly of the right flank.  Objective: Vitals:   05/11/16 2025 05/11/16 2031 05/12/16 0555 05/12/16 0900  BP:  (!) 141/83 (!) 150/82   Pulse:  61 62   Resp:  18 18   Temp:  98.9 F (37.2 C) 99.1 F (37.3 C)   TempSrc:   Oral   SpO2: 96% 99% 96% 96%  Weight:      Height:        Intake/Output Summary (Last 24  hours) at 05/12/16 1244 Last data filed at 05/12/16 0930  Gross per 24 hour  Intake             1097 ml  Output                0 ml  Net             1097 ml   Filed Weights   05/09/16 1316 05/09/16 1359  Weight: 49.9 kg (110 lb) 49.9 kg (110 lb)    Examination:  General exam:  Adult Female.  Frequent wheezy cough HEENT:  NCAT, MMM Respiratory system:  Diminished bilateral breath sounds with full expiratory wheeze throughout,  no focal rales no rhonchi Cardiovascular system: Regular rate and rhythm, normal S1/S2. No murmurs, rubs, gallops or clicks.  Warm extremities Gastrointestinal system: Normal active bowel sounds, soft, nondistended, nontender.  Positive right flank pain MSK:  Normal tone and bulk, no lower extremity edema Neuro:  Grossly intact    Data Reviewed: I have personally reviewed following labs and imaging studies  CBC:  Recent Labs Lab 05/09/16 1334 05/10/16 0505  WBC 15.5* 12.2*  NEUTROABS 14.1*  --   HGB 10.4* 10.1*  HCT 30.3* 28.9*  MCV 87.6 89.8  PLT 286 322   Basic Metabolic Panel:  Recent Labs Lab 05/09/16 1334 05/10/16 0505  NA 132* 136  K 3.7 3.4*  CL 100* 107  CO2 24 25  GLUCOSE 125* 113*  BUN 17 12  CREATININE 0.85 0.71  CALCIUM 8.2* 8.1*   GFR: Estimated Creatinine Clearance: 56 mL/min (by C-G formula based on SCr of 0.71 mg/dL). Liver Function Tests:  Recent Labs Lab 05/09/16 1334  AST 24  ALT 16  ALKPHOS 66  BILITOT 0.4  PROT 6.4*  ALBUMIN 3.5   No results for input(s): LIPASE, AMYLASE in the last 168 hours. No results for input(s): AMMONIA in the last 168 hours. Coagulation Profile: No results for input(s): INR, PROTIME in the last 168 hours. Cardiac Enzymes:  Recent Labs Lab 05/09/16 1445 05/09/16 1713 05/09/16 2306 05/10/16 0505  TROPONINI 0.13* 0.12* 0.08* 0.06*   BNP (last 3 results) No results for input(s): PROBNP in the last 8760 hours. HbA1C: No results for input(s): HGBA1C in the last 72 hours. CBG: No results for input(s): GLUCAP in the last 168 hours. Lipid Profile: No results for input(s): CHOL, HDL, LDLCALC, TRIG, CHOLHDL, LDLDIRECT in the last 72 hours. Thyroid Function Tests: No results for input(s): TSH, T4TOTAL, FREET4, T3FREE, THYROIDAB in the last 72 hours. Anemia Panel: No results for input(s): VITAMINB12, FOLATE, FERRITIN, TIBC, IRON, RETICCTPCT in the last 72 hours. Urine analysis:    Component Value Date/Time    COLORURINE YELLOW 05/09/2016 1435   APPEARANCEUR CLOUDY (A) 05/09/2016 1435   LABSPEC 1.010 05/09/2016 1435   PHURINE 6.0 05/09/2016 1435   GLUCOSEU NEGATIVE 05/09/2016 1435   HGBUR MODERATE (A) 05/09/2016 1435   BILIRUBINUR NEGATIVE 05/09/2016 1435   KETONESUR NEGATIVE 05/09/2016 1435   PROTEINUR NEGATIVE 05/09/2016 1435   UROBILINOGEN 0.2 07/08/2013 1156   NITRITE POSITIVE (A) 05/09/2016 1435   LEUKOCYTESUR LARGE (A) 05/09/2016 1435   Sepsis Labs: '@LABRCNTIP'$ (procalcitonin:4,lacticidven:4)  ) Recent Results (from the past 240 hour(s))  Urine culture     Status: Abnormal   Collection Time: 05/09/16  2:38 PM  Result Value Ref Range Status   Specimen Description URINE, RANDOM  Final   Special Requests NONE  Final   Culture (A)  Final    >=100,000 COLONIES/mL ESCHERICHIA  COLI Confirmed Extended Spectrum Beta-Lactamase Producer (ESBL) Performed at [facility]    Report Status 05/11/2016 FINAL  Final   Organism ID, Bacteria ESCHERICHIA COLI (A)  Final      Susceptibility   Escherichia coli - MIC*    AMPICILLIN >=32 RESISTANT Resistant     CEFAZOLIN >=64 RESISTANT Resistant     CEFTRIAXONE >=64 RESISTANT Resistant     CIPROFLOXACIN >=4 RESISTANT Resistant     GENTAMICIN <=1 SENSITIVE Sensitive     IMIPENEM <=0.25 SENSITIVE Sensitive     NITROFURANTOIN <=16 SENSITIVE Sensitive     TRIMETH/SULFA >=320 RESISTANT Resistant     AMPICILLIN/SULBACTAM >=32 RESISTANT Resistant     PIP/TAZO 64 INTERMEDIATE Intermediate     Extended ESBL POSITIVE Resistant     * >=100,000 COLONIES/mL ESCHERICHIA COLI  Culture, blood (routine x 2)     Status: None (Preliminary result)   Collection Time: 05/09/16  2:45 PM  Result Value Ref Range Status   Specimen Description BLOOD LEFT ANTECUBITAL  Final   Special Requests BOTTLES DRAWN AEROBIC AND ANAEROBIC 5ML  Final   Culture   Final    NO GROWTH 2 DAYS Performed at [facility]    Report Status PENDING  Incomplete  Culture,  blood (routine x 2)     Status: None (Preliminary result)   Collection Time: 05/09/16  2:50 PM  Result Value Ref Range Status   Specimen Description BLOOD RIGHT ANTECUBITAL  Final   Special Requests BOTTLES DRAWN AEROBIC AND ANAEROBIC 5ML  Final   Culture   Final    NO GROWTH 2 DAYS Performed at [facility]    Report Status PENDING  Incomplete      Radiology Studies: No results found.   Scheduled Meds: . aspirin EC  81 mg Oral Daily  . enoxaparin (LOVENOX) injection  40 mg Subcutaneous Q24H  . feeding supplement (ENSURE ENLIVE)  237 mL Oral BID BM  . fluconazole  400 mg Oral QHS  . FLUoxetine  80 mg Oral Daily  . ipratropium-albuterol  3 mL Nebulization BID  . levothyroxine  88 mcg Oral QAC breakfast  . meropenem (MERREM) IV  1 g Intravenous Q8H  . nicotine  7 mg Transdermal Daily  . sodium chloride flush  3 mL Intravenous Q12H   Continuous Infusions:     LOS: 2 days    Time spent: 30 min    Janece Canterbury, MD [facility] Pager [phone]  If 7PM-7AM, please contact night-coverage [email or web address] Password [email or web address] 05/12/2016, 12:44 PM

## 2016-05-13 DIAGNOSIS — I951 Orthostatic hypotension: Secondary | ICD-10-CM

## 2016-05-13 DIAGNOSIS — A4151 Sepsis due to Escherichia coli [E. coli]: Secondary | ICD-10-CM

## 2016-05-13 MED ORDER — FLUTICASONE PROPIONATE 50 MCG/ACT NA SUSP: 2.0000 | Freq: Every day | NASAL | 0 refills | Status: DC | PRN

## 2016-05-13 MED ORDER — FOSFOMYCIN TROMETHAMINE 3 G PO PACK
3.0000 g | PACK | Freq: Once | ORAL | Status: AC
  Administered 2016-05-13: 3 g via ORAL
  Filled 2016-05-13: qty 3

## 2016-05-13 NOTE — Discharge Summary (Signed)
Physician Discharge Summary  Amy Hamilton QQV:[id number] DOB: 1951-07-20 DOA: 05/09/2016  PCP: Harvie Junior, MD  Admit date: 05/09/2016 Discharge date: 05/13/2016  Admitted From: Home  Disposition:  Home  Recommendations for Outpatient Follow-up:  1. Follow up with Dr. Tommy Medal at scheduled appointment in approximately 2 weeks 2. Given fosfomycin prior to discharge PCP:  Ongoing smoking cessation encouragement.  Consider referral for PFTs.  Consider referral to cardiology  Home Health:  none  Equipment/Devices:  None  Discharge Condition:  Stable, improved CODE STATUS:  FULL  Diet recommendation:  Healthy heart   Brief/Interim Summary:  Amy Edenfield Nolanis an 65 y.o.femalepast medical history of hypothyroidism, osteoporosis, and right ankle osteomyelitis, status post IV antibiotics and hardware removal, who presented with generalized weakness and fatigue, and hypotension. She was found to be hypotensive with systolic blood pressure in the 60s.  She was given IV fluid boluses with improvement in her BP in the ER and started on ceftriaxone due to suspicion for UTI based on UA.  Despite ceftriaxone, she remained fatigued with borderline hypotension and ongoing high fevers to 103F.  Urine culture grew ESBL Escherichia coli and her ceftriaxone was change to merpenem.  Case was discussed with infectious disease, who recommended a 2-3 day course of IV antibiotics for possible pyelonephritis followed by fosfomycin to treat any residual bladder disease.  She completed 48h of meropenem on 10/20 and was given fosfomycin the same day.  She had rapid improvement in blood pressure, fevers, and energy after correcting her antibiotics and was asking to be discharged.    Discharge Diagnoses:  Principal Problem:   Urinary tract infection due to extended-spectrum beta lactamase (ESBL) producing Escherichia coli Active Problems:   Abdominal pain, acute   Osteomyelitis, 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HCC)   Elevated  troponin   Hypotension   Sepsis (HCC)   Leukocytosis  Sepsiswith hypotension and leukocytosis likely due to ESBL Escherichia coli UTI, present at time of admission -  Given ertapenem followed by meropenem and finally fosfomycin 3g on 10/20. -  Follow-up with infectious disease within 2 weeks of discharge -  CT scan of the abdomen and pelvis does not show any acute pathology. -  Blood cultures 2 no growth to date  Abdominal and back pain, acute, likely secondary to UTI and improved with adjustment of antibiotics, motrin, ultram, and heating bad.  Osteomyelitis, 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[place]) Patient just finished a 6 week course of IV antibiotics, however, her wound culture eventually grew Candida -  Continued fluconazole 400 mg daily -  F/u with infectious disease in 2 weeks  Elevated troponin, trended down, likely due to demand ischemia from severe hypotension.  She remained chest pain free.   - Echo:  Mild LVH, normal systolic function with ejection fraction of 60-65 percent, normal left ventricular diastolic function. -  Referral to cardiology if felt indicated due to risk factors of smoking and hypertension  Hypothyroidism: Continued Synthroid.  COPD, probable diagnosis given ongoing wheezing and smoking history -  Given duo nebs -  Counseled smoking cessation.  Patient contemplative and states she is ready to quit. -  Recommend outpatient PFTs  Essential hypertension, blood pressures elevated.   -  Follow up with PCP as outpatient for ongoing BP management  ADD, stable, continued adderall   Discharge Instructions  Discharge Instructions    Call MD for:  difficulty breathing, headache or visual disturbances    Complete by:  As directed    Call MD for:  extreme fatigue    Complete by:  As directed    Call MD for:  hives    Complete by:  As directed    Call MD for:  persistant dizziness or light-headedness    Complete by:  As directed    Call MD for:  persistant nausea  and vomiting    Complete by:  As directed    Call MD for:  severe uncontrolled pain    Complete by:  As directed    Call MD for:  temperature >100.4    Complete by:  As directed    Diet - low sodium heart healthy    Complete by:  As directed    Increase activity slowly    Complete by:  As directed        Medication List    STOP taking these medications   sulfamethoxazole-trimethoprim 800-160 MG tablet Commonly known as:  BACTRIM DS,SEPTRA DS     TAKE these medications   albuterol 108 (90 Base) MCG/ACT inhaler Commonly known as:  PROVENTIL HFA;VENTOLIN HFA Inhale 1-2 puffs into the lungs every 6 (six) hours as needed for wheezing or shortness of breath. What changed:  how much to take   amphetamine-dextroamphetamine 20 MG tablet Commonly known as:  ADDERALL Take 20 mg by mouth 2 (two) times daily as needed (focus).   aspirin 81 MG EC tablet Take 1 tablet (81 mg total) by mouth daily. What changed:  when to take this   estradiol 0.5 MG tablet Commonly known as:  ESTRACE Take 0.5 mg by mouth daily at 12 noon.   fluconazole 200 MG tablet Commonly known as:  DIFLUCAN Take 2 tablets (400 mg total) by mouth daily.   FLUoxetine 40 MG capsule Commonly known as:  PROZAC Take 2 capsules (80 mg total) by mouth daily. What changed:  when to take this   fluticasone 50 MCG/ACT nasal spray Commonly known as:  FLONASE Place 2 sprays into both nostrils daily as needed for allergies or rhinitis.   GOODY HEADACHE PO Take 1 packet by mouth 2 (two) times daily as needed (pain).   levothyroxine 88 MCG tablet Commonly known as:  SYNTHROID, LEVOTHROID Take 88 mcg by mouth daily before breakfast.   VITAMIN E PO Take 1 capsule by mouth 2 (two) times daily. Reported on 02/02/2016      Follow-up Information    Harvie Junior, MD Follow up in 2 week(s).   Specialty:  Family Medicine Contact information: [place] [address] [place], [place]. Schedule an appointment as soon as possible for a visit in 2 week(s).   Specialty:  Infectious Diseases Contact information: [address] [place] [address] [phone]          No Known Allergies  Consultations: Dr. Tommy Medal by phone, Infectious Disease   Procedures/Studies: Dg Chest 2 View  Result Date: 05/09/2016 CLINICAL DATA:  Cough productive of yellow sputum for 3 weeks, hypotension, smoker EXAM: CHEST  2 VIEW COMPARISON:  01/05/2015 FINDINGS: Normal heart size and pulmonary vascularity. Atherosclerotic calcification aorta. Small hiatal hernia. Lungs emphysematous but clear. No pleural effusion or pneumothorax. Bones demineralized with old healed posttraumatic deformity of the proximal LEFT humerus. Levoconvex thoracolumbar scoliosis. IMPRESSION: Emphysematous changes question COPD. No acute infiltrate. Small hiatal hernia. Electronically Signed   By: Lavonia Dana M.D.   On: 05/09/2016 15:29   Ct Abdomen Pelvis W Contrast  Result Date: 05/09/2016  CLINICAL DATA:  Abdominal pain EXAM: CT ABDOMEN AND PELVIS WITH CONTRAST TECHNIQUE: Multidetector CT imaging of the abdomen and pelvis was performed using the standard protocol following bolus administration of intravenous contrast. CONTRAST:  107m ISOVUE-300 IOPAMIDOL (ISOVUE-300) INJECTION 61%, 343mISOVUE-300 IOPAMIDOL (ISOVUE-300) INJECTION 61% COMPARISON:  CT abdomen pelvis 01/06/2015 FINDINGS: Lower chest: Lung bases clear.  Moderate hiatal hernia. Hepatobiliary: Cholelithiasis. No gallbladder wall thickening. Bile ducts nondilated. Normal liver. Pancreas: Negative Spleen: Negative Adrenals/Urinary Tract: Symmetric excretion of contrast by both kidneys without obstruction. No renal calculi. 3 cm right renal cyst laterally. Normal urinary bladder. Stomach/Bowel: Stomach and duodenum normal. Hiatal hernia. Negative for bowel obstruction. No bowel mass or edema. Appendix not discretely visualized but no evidence of  inflammation in the right lower quadrant. Vascular/Lymphatic: Atherosclerotic calcification without aneurysm. No adenopathy. Reproductive: Hysterectomy.  No pelvic mass. Other: No free fluid or free air.  Negative for hernia. Musculoskeletal: Moderate dextroscoliosis with multilevel lumbar degenerative change. No acute fracture. Left hip fracture fixation. IMPRESSION: Cholelithiasis without evidence of cholecystitis Hiatal hernia Atherosclerotic disease without aortic aneurysm No acute abnormality identified.  Appendix not visualized. Electronically Signed   By: ChFranchot Gallo.D.   On: 05/09/2016 18:18    Subjective: Feeling much better today.  Back pain has completely resolved and she would like to go home.  No further fevers.    Discharge Exam: Vitals:   05/12/16 2214 05/13/16 0528  BP: (!) 179/86 (!) 165/84  Pulse: 72 (!) 58  Resp: 20 18  Temp: 97.6 F (36.4 C) 98.4 F (36.9 C)   Vitals:   05/12/16 1326 05/12/16 2106 05/12/16 2214 05/13/16 0528  BP: 140/80  (!) 179/86 (!) 165/84  Pulse: 68  72 (!) 58  Resp: '20  20 18  '$ Temp: 99.2 F (37.3 C)  97.6 F (36.4 C) 98.4 F (36.9 C)  TempSrc: Oral  Oral Oral  SpO2: 98% 97% 96% 99%  Weight:      Height:        General exam:  Adult Female.  NAD HEENT:  NCAT, MMM Respiratory system:  Diminished bilateral breath sounds, no wheezes, rales, or rhonchi Cardiovascular system: Regular rate and rhythm, normal S1/S2. No murmurs, rubs, gallops or clicks.  Warm extremities Gastrointestinal system: Normal active bowel sounds, soft, nondistended, nontender MSK:  Normal tone and bulk, no lower extremity edema Neuro:  Grossly intact    The results of significant diagnostics from this hospitalization (including imaging, microbiology, ancillary and laboratory) are listed below for reference.     Microbiology: Recent Results (from the past 240 hour(s))  Urine culture     Status: Abnormal   Collection Time: 05/09/16  2:38 PM  Result Value Ref  Range Status   Specimen Description URINE, RANDOM  Final   Special Requests NONE  Final   Culture (A)  Final    >=100,000 COLONIES/mL ESCHERICHIA COLI Confirmed Extended Spectrum Beta-Lactamase Producer (ESBL) Performed at [facility]  Report Status 05/11/2016 FINAL  Final   Organism ID, Bacteria ESCHERICHIA COLI (A)  Final      Susceptibility   Escherichia coli - MIC*    AMPICILLIN >=32 RESISTANT Resistant     CEFAZOLIN >=64 RESISTANT Resistant     CEFTRIAXONE >=64 RESISTANT Resistant     CIPROFLOXACIN >=4 RESISTANT Resistant     GENTAMICIN <=1 SENSITIVE Sensitive     IMIPENEM <=0.25 SENSITIVE Sensitive     NITROFURANTOIN <=16 SENSITIVE Sensitive     TRIMETH/SULFA >=320 RESISTANT Resistant  AMPICILLIN/SULBACTAM >=32 RESISTANT Resistant     PIP/TAZO 64 INTERMEDIATE Intermediate     Extended ESBL POSITIVE Resistant     * >=100,000 COLONIES/mL ESCHERICHIA COLI  Culture, blood (routine x 2)     Status: None (Preliminary result)   Collection Time: 05/09/16  2:45 PM  Result Value Ref Range Status   Specimen Description BLOOD LEFT ANTECUBITAL  Final   Special Requests BOTTLES DRAWN AEROBIC AND ANAEROBIC 5ML  Final   Culture   Final    NO GROWTH 4 DAYS Performed at [facility]    Report Status PENDING  Incomplete  Culture, blood (routine x 2)     Status: None (Preliminary result)   Collection Time: 05/09/16  2:50 PM  Result Value Ref Range Status   Specimen Description BLOOD RIGHT ANTECUBITAL  Final   Special Requests BOTTLES DRAWN AEROBIC AND ANAEROBIC 5ML  Final   Culture   Final    NO GROWTH 4 DAYS Performed at [facility]    Report Status PENDING  Incomplete     Labs: BNP (last 3 results) No results for input(s): BNP in the last 8760 hours. Basic Metabolic Panel:  Recent Labs Lab 05/09/16 1334 05/10/16 0505  NA 132* 136  K 3.7 3.4*  CL 100* 107  CO2 24 25  GLUCOSE 125* 113*  BUN 17 12  CREATININE 0.85 0.71  CALCIUM 8.2* 8.1*    Liver Function Tests:  Recent Labs Lab 05/09/16 1334  AST 24  ALT 16  ALKPHOS 66  BILITOT 0.4  PROT 6.4*  ALBUMIN 3.5   No results for input(s): LIPASE, AMYLASE in the last 168 hours. No results for input(s): AMMONIA in the last 168 hours. CBC:  Recent Labs Lab 05/09/16 1334 05/10/16 0505  WBC 15.5* 12.2*  NEUTROABS 14.1*  --   HGB 10.4* 10.1*  HCT 30.3* 28.9*  MCV 87.6 89.8  PLT 286 265   Cardiac Enzymes:  Recent Labs Lab 05/09/16 1445 05/09/16 1713 05/09/16 2306 05/10/16 0505  TROPONINI 0.13* 0.12* 0.08* 0.06*   BNP: Invalid input(s): POCBNP CBG: No results for input(s): GLUCAP in the last 168 hours. D-Dimer No results for input(s): DDIMER in the last 72 hours. Hgb A1c No results for input(s): HGBA1C in the last 72 hours. Lipid Profile No results for input(s): CHOL, HDL, LDLCALC, TRIG, CHOLHDL, LDLDIRECT in the last 72 hours. Thyroid function studies No results for input(s): TSH, T4TOTAL, T3FREE, THYROIDAB in the last 72 hours.  Invalid input(s): FREET3 Anemia work up No results for input(s): VITAMINB12, FOLATE, FERRITIN, TIBC, IRON, RETICCTPCT in the last 72 hours. Urinalysis    Component Value Date/Time   COLORURINE YELLOW 05/09/2016 1435   APPEARANCEUR CLOUDY (A) 05/09/2016 1435   LABSPEC 1.010 05/09/2016 1435   PHURINE 6.0 05/09/2016 1435   GLUCOSEU NEGATIVE 05/09/2016 1435   HGBUR MODERATE (A) 05/09/2016 1435   BILIRUBINUR NEGATIVE 05/09/2016 1435   KETONESUR NEGATIVE 05/09/2016 1435   PROTEINUR NEGATIVE 05/09/2016 1435   UROBILINOGEN 0.2 07/08/2013 1156   NITRITE POSITIVE (A) 05/09/2016 1435   LEUKOCYTESUR LARGE (A) 05/09/2016 1435   Sepsis Labs Invalid input(s): PROCALCITONIN,  WBC,  LACTICIDVEN   Time coordinating discharge: Over 30 minutes  SIGNED:   Janece Canterbury, MD  [facility] 05/13/2016, 2:34 PM Pager   If 7PM-7AM, please contact night-coverage [email or web address] Password [facility]'

## 2016-05-13 NOTE — Care Management Note (Signed)
Case Management Note  Patient Details  Name: Amy Hamilton MRN: [id number] Date of Birth: [birth year]  Subjective/Objective: 65 y/o f admitted w/UTI. From home.                   Action/Plan:d/c home.   Expected Discharge Date:   (unknown)               Expected Discharge Plan:  Home/Self Care  In-House Referral:     Discharge planning Services     Post Acute Care Choice:    Choice offered to:     DME Arranged:    DME Agency:     HH Arranged:    [place] Agency:     Status of Service:  Completed, signed off  If discussed at [employer] of Stay Meetings, dates discussed:    Additional Comments:  Dessa Phi, RN 05/13/2016, 12:01 PM

## 2016-05-14 LAB — CULTURE, BLOOD (ROUTINE X 2)
CULTURE: NO GROWTH
CULTURE: NO GROWTH

## 2016-05-16 ENCOUNTER — Other Ambulatory Visit: Payer: Medicaid Other

## 2016-05-18 ENCOUNTER — Encounter: Payer: Self-pay | Admitting: Infectious Disease

## 2016-05-19 ENCOUNTER — Encounter: Payer: Self-pay | Admitting: Infectious Disease

## 2016-05-30 ENCOUNTER — Ambulatory Visit: Payer: Medicaid Other | Admitting: Infectious Disease

## 2016-11-02 DIAGNOSIS — J441 Chronic obstructive pulmonary disease with (acute) exacerbation: Secondary | ICD-10-CM | POA: Diagnosis not present

## 2016-11-02 DIAGNOSIS — F17200 Nicotine dependence, unspecified, uncomplicated: Secondary | ICD-10-CM | POA: Diagnosis not present

## 2016-11-02 DIAGNOSIS — E039 Hypothyroidism, unspecified: Secondary | ICD-10-CM

## 2016-11-02 DIAGNOSIS — J159 Unspecified bacterial pneumonia: Secondary | ICD-10-CM | POA: Diagnosis not present

## 2016-11-02 DIAGNOSIS — F329 Major depressive disorder, single episode, unspecified: Secondary | ICD-10-CM | POA: Diagnosis not present

## 2016-11-02 DIAGNOSIS — M069 Rheumatoid arthritis, unspecified: Secondary | ICD-10-CM

## 2016-11-02 DIAGNOSIS — J9601 Acute respiratory failure with hypoxia: Secondary | ICD-10-CM | POA: Diagnosis not present

## 2016-11-04 DIAGNOSIS — J9601 Acute respiratory failure with hypoxia: Secondary | ICD-10-CM | POA: Diagnosis not present

## 2016-11-04 DIAGNOSIS — F17200 Nicotine dependence, unspecified, uncomplicated: Secondary | ICD-10-CM | POA: Diagnosis not present

## 2016-11-04 DIAGNOSIS — M069 Rheumatoid arthritis, unspecified: Secondary | ICD-10-CM | POA: Diagnosis not present

## 2016-11-04 DIAGNOSIS — J159 Unspecified bacterial pneumonia: Secondary | ICD-10-CM | POA: Diagnosis not present

## 2016-11-04 DIAGNOSIS — F329 Major depressive disorder, single episode, unspecified: Secondary | ICD-10-CM | POA: Diagnosis not present

## 2016-11-04 DIAGNOSIS — J441 Chronic obstructive pulmonary disease with (acute) exacerbation: Secondary | ICD-10-CM | POA: Diagnosis not present

## 2016-11-04 DIAGNOSIS — E039 Hypothyroidism, unspecified: Secondary | ICD-10-CM | POA: Diagnosis not present

## 2016-11-05 DIAGNOSIS — F17200 Nicotine dependence, unspecified, uncomplicated: Secondary | ICD-10-CM | POA: Diagnosis not present

## 2016-11-05 DIAGNOSIS — M069 Rheumatoid arthritis, unspecified: Secondary | ICD-10-CM | POA: Diagnosis not present

## 2016-11-05 DIAGNOSIS — E039 Hypothyroidism, unspecified: Secondary | ICD-10-CM | POA: Diagnosis not present

## 2016-11-05 DIAGNOSIS — J9601 Acute respiratory failure with hypoxia: Secondary | ICD-10-CM | POA: Diagnosis not present

## 2016-11-05 DIAGNOSIS — F329 Major depressive disorder, single episode, unspecified: Secondary | ICD-10-CM | POA: Diagnosis not present

## 2016-11-05 DIAGNOSIS — J159 Unspecified bacterial pneumonia: Secondary | ICD-10-CM | POA: Diagnosis not present

## 2016-11-05 DIAGNOSIS — J441 Chronic obstructive pulmonary disease with (acute) exacerbation: Secondary | ICD-10-CM | POA: Diagnosis not present

## 2018-07-17 ENCOUNTER — Emergency Department (HOSPITAL_BASED_OUTPATIENT_CLINIC_OR_DEPARTMENT_OTHER): Payer: Medicare Other

## 2018-07-17 ENCOUNTER — Emergency Department (HOSPITAL_COMMUNITY): Payer: Medicare Other

## 2018-07-17 ENCOUNTER — Emergency Department (HOSPITAL_COMMUNITY)
Admission: EM | Admit: 2018-07-17 | Discharge: 2018-07-17 | Disposition: A | Discharge status: Home / Self Care | Payer: Medicare Other | Attending: Emergency Medicine | Admitting: Emergency Medicine

## 2018-07-17 ENCOUNTER — Encounter (HOSPITAL_COMMUNITY): Payer: Self-pay | Admitting: Emergency Medicine

## 2018-07-17 DIAGNOSIS — L538 Other specified erythematous conditions: Secondary | ICD-10-CM | POA: Diagnosis not present

## 2018-07-17 DIAGNOSIS — Y939 Activity, unspecified: Secondary | ICD-10-CM | POA: Diagnosis not present

## 2018-07-17 DIAGNOSIS — Y929 Unspecified place or not applicable: Secondary | ICD-10-CM | POA: Diagnosis not present

## 2018-07-17 DIAGNOSIS — F17210 Nicotine dependence, cigarettes, uncomplicated: Secondary | ICD-10-CM | POA: Diagnosis not present

## 2018-07-17 DIAGNOSIS — M66321 Spontaneous rupture of flexor tendons, right upper arm: Secondary | ICD-10-CM | POA: Diagnosis not present

## 2018-07-17 DIAGNOSIS — S4991XA Unspecified injury of right shoulder and upper arm, initial encounter: Secondary | ICD-10-CM | POA: Diagnosis present

## 2018-07-17 DIAGNOSIS — Y998 Other external cause status: Secondary | ICD-10-CM | POA: Insufficient documentation

## 2018-07-17 DIAGNOSIS — Z79899 Other long term (current) drug therapy: Secondary | ICD-10-CM | POA: Diagnosis not present

## 2018-07-17 DIAGNOSIS — E039 Hypothyroidism, unspecified: Secondary | ICD-10-CM | POA: Diagnosis not present

## 2018-07-17 DIAGNOSIS — Y33XXXA Other specified events, undetermined intent, initial encounter: Secondary | ICD-10-CM | POA: Diagnosis not present

## 2018-07-17 DIAGNOSIS — M79609 Pain in unspecified limb: Secondary | ICD-10-CM

## 2018-07-17 DIAGNOSIS — S46211A Strain of muscle, fascia and tendon of other parts of biceps, right arm, initial encounter: Secondary | ICD-10-CM

## 2018-07-17 LAB — CBC WITH DIFFERENTIAL/PLATELET
Abs Immature Granulocytes: 0.01 10*3/uL (ref 0.00–0.07)
Basophils Absolute: 0 10*3/uL (ref 0.0–0.1)
Basophils Relative: 1 %
Eosinophils Absolute: 0.2 10*3/uL (ref 0.0–0.5)
Eosinophils Relative: 4 %
HCT: 34.9 % — ABNORMAL LOW (ref 36.0–46.0)
Hemoglobin: 11.4 g/dL — ABNORMAL LOW (ref 12.0–15.0)
Immature Granulocytes: 0 %
Lymphocytes Relative: 30 %
Lymphs Abs: 1.7 10*3/uL (ref 0.7–4.0)
MCH: 30.8 pg (ref 26.0–34.0)
MCHC: 32.7 g/dL (ref 30.0–36.0)
MCV: 94.3 fL (ref 80.0–100.0)
Monocytes Absolute: 0.5 10*3/uL (ref 0.1–1.0)
Monocytes Relative: 8 %
Neutro Abs: 3.3 10*3/uL (ref 1.7–7.7)
Neutrophils Relative %: 57 %
Platelets: 291 10*3/uL (ref 150–400)
RBC: 3.7 MIL/uL — ABNORMAL LOW (ref 3.87–5.11)
RDW: 11.6 % (ref 11.5–15.5)
WBC: 5.6 10*3/uL (ref 4.0–10.5)
nRBC: 0 % (ref 0.0–0.2)

## 2018-07-17 LAB — BASIC METABOLIC PANEL
Anion gap: 9 (ref 5–15)
BUN: 7 mg/dL — ABNORMAL LOW (ref 8–23)
CO2: 27 mmol/L (ref 22–32)
Calcium: 8.9 mg/dL (ref 8.9–10.3)
Chloride: 99 mmol/L (ref 98–111)
Creatinine, Ser: 0.59 mg/dL (ref 0.44–1.00)
GFR calc Af Amer: >60 mL/min (ref >60)
GFR calc non Af Amer: >60 mL/min (ref >60)
Glucose, Bld: 103 mg/dL — ABNORMAL HIGH (ref 70–99)
Potassium: 4 mmol/L (ref 3.5–5.1)
Sodium: 135 mmol/L (ref 135–145)

## 2018-07-17 LAB — I-STAT TROPONIN, ED: Troponin i, poc: 0 ng/mL (ref 0.00–0.08)

## 2018-07-17 LAB — D-DIMER, QUANTITATIVE: D-Dimer, Quant: 0.92 ug/mL-FEU — ABNORMAL HIGH (ref 0.00–0.50)

## 2018-07-17 MED ORDER — GADOBUTROL 1 MMOL/ML IV SOLN
5.0000 mL | Freq: Once | INTRAVENOUS | Status: AC | PRN
  Administered 2018-07-17: 5 mL via INTRAVENOUS

## 2018-07-17 MED ORDER — IOPAMIDOL (ISOVUE-370) INJECTION 76%
INTRAVENOUS | Status: AC
  Administered 2018-07-17: 100 mL
  Filled 2018-07-17: qty 100

## 2018-07-17 MED ORDER — MORPHINE SULFATE (PF) 4 MG/ML IV SOLN
4.0000 mg | Freq: Once | INTRAVENOUS | Status: AC
  Administered 2018-07-17: 4 mg via INTRAVENOUS
  Filled 2018-07-17: qty 1

## 2018-07-17 NOTE — ED Provider Notes (Signed)
[facility] EMERGENCY DEPARTMENT Provider Note   CSN: [id number] Arrival date & time: 07/17/18  1356     History   Chief Complaint Chief Complaint  Patient presents with  . Arm Injury    HPI Amy Hamilton is a 67 y.o. female with history of chronic back pain, degenerative disc disease, osteomyelitis of the ankle and foot, thyroid disease presents for evaluation of acute onset, progressively worsening atraumatic right upper extremity pain.  She reports that she has been having some pain to the right upper arm and right shoulder approximately 1 week ago but in the past 3 days she noticed some significant ecchymosis that does not past the elbow.  She states that initially began higher up but has since traveled down.  At rest she notes a dull pain to the right upper arm and with any movement or attempts to carry anything she will feel more severe pain.  Does not radiate.  She does note some numbness and tingling to her right fingertips which is unusual for her.  She denies any fevers, recent trauma or falls.  She is applied a muscle rub without improvement.  She does note that she has had pleuritic chest pains that began approximately 2 weeks ago but resolved last week.  She is a current smoker and does take estrogen hormone replacement tablets but stopped taking this 2 days ago as she "was told they could cause blood clots ".  Denies any recent travel or surgeries, hemoptysis, prior history of DVT or PE, or history of cancer.  The history is provided by the patient.    Past Medical History:  Diagnosis Date  . Chronic back pain   . DDD (degenerative disc disease), cervical   . Depression   . Hardware complicating wound infection ([place]) 02/02/2016  . Osteomyelitis, 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[place]) 02/02/2016  . Routine screening for STI (sexually transmitted infection) 02/02/2016  . Thyroid disease   . Uterine prolapse     Patient Active Problem List   Diagnosis Date Noted  .  Urinary tract infection due to extended-spectrum beta lactamase (ESBL) producing Escherichia coli 05/11/2016  . Sepsis ([place]) 05/10/2016  . Leukocytosis 05/10/2016  . Elevated troponin 05/09/2016  . Hypotension 05/09/2016  . Hardware complicating wound infection ([place]) 02/02/2016  . Osteomyelitis, 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[place]) 02/02/2016  . Routine screening for STI (sexually transmitted infection) 02/02/2016  . Chest pain, musculoskeletal 01/07/2015  . Postoperative hematoma 01/07/2015  . Acute carpal tunnel syndrome of left wrist 01/07/2015  . Hypokalemia 01/07/2015  . Acute blood loss anemia 01/07/2015  . Abnormal EKG 01/06/2015  . Abdominal pain, acute 01/06/2015  . Hyponatremia 01/06/2015  . Anemia 01/06/2015  . Closed left radial fracture 01/06/2015  . S/P ORIF (open reduction internal fixation) fracture 01/06/2015  . Intertrochanteric fracture of left hip ([place]) 10/23/2012  . Hip fracture, left ([place]) 10/17/2012  . Fall at home 10/17/2012  . Hypothyroidism 10/17/2012  . Depression 10/17/2012  . Back pain 10/17/2012    Past Surgical History:  Procedure Laterality Date  . ABDOMINAL HYSTERECTOMY    . ANKLE SURGERY Right   . APPLICATION OF WOUND VAC Left 01/06/2015   Procedure: APPLICATION OF WOUND VAC;  Surgeon: Milly Jakob, MD;  Location: [facility];  Service: Orthopedics;  Laterality: Left;  . BACK SURGERY    . CARPAL TUNNEL RELEASE Left 01/06/2015   Procedure: CARPAL TUNNEL RELEASE;  Surgeon: Milly Jakob, MD;  Location: [facility];  Service: Orthopedics;  Laterality: Left;  .  FEMUR IM NAIL Left 10/18/2012   Procedure: INTRAMEDULLARY (IM) NAIL FEMORAL;  Surgeon: Meredith Pel, MD;  Location: [facility];  Service: Orthopedics;  Laterality: Left;  . I&D EXTREMITY Left 01/06/2015   Procedure: IRRIGATION AND DEBRIDEMENT EXTREMITY, EVACUATION HEMATOMA;  Surgeon: Milly Jakob, MD;  Location: [facility];  Service: Orthopedics;  Laterality: Left;  . ORIF ULNAR FRACTURE Left 01/04/2015   Procedure: OPEN  REDUCTION INTERNAL FIXATION (ORIF) ULNAR FRACTURE;  Surgeon: Milly Jakob, MD;  Location: [facility];  Service: Orthopedics;  Laterality: Left;  . SECONDARY CLOSURE OF WOUND Left 01/15/2015   Procedure: SECONDARY CLOSURE OF WOUND LEFT FOREARM;  Surgeon: Milly Jakob, MD;  Location: [facility];  Service: Orthopedics;  Laterality: Left;     OB History   No obstetric history on file.      Home Medications    Prior to Admission medications   Medication Sig Start Date End Date Taking? Authorizing Provider  albuterol (PROVENTIL HFA;VENTOLIN HFA) 108 (90 BASE) MCG/ACT inhaler Inhale 1-2 puffs into the lungs every 6 (six) hours as needed for wheezing or shortness of breath. Patient taking differently: Inhale 2 puffs into the lungs every 6 (six) hours as needed for wheezing or shortness of breath.  10/26/12  Yes Angiulli, Lavon Paganini, PA-C  estradiol (ESTRACE) 0.5 MG tablet Take 0.5 mg by mouth daily at 12 noon.   Yes [provider]  FLUoxetine (PROZAC) 40 MG capsule Take 2 capsules (80 mg total) by mouth daily. Patient taking differently: Take 80 mg by mouth every morning.  10/26/12  Yes Angiulli, Lavon Paganini, PA-C  fluticasone (FLONASE) 50 MCG/ACT nasal spray Place 2 sprays into both nostrils daily as needed for allergies or rhinitis. 05/13/16  Yes Short, Noah Delaine, MD  levothyroxine (SYNTHROID, LEVOTHROID) 88 MCG tablet Take 88 mcg by mouth daily before breakfast.   Yes [provider]  lisdexamfetamine (VYVANSE) 70 MG capsule Take 70 mg by mouth daily.   Yes [provider]  aspirin EC 81 MG EC tablet Take 1 tablet (81 mg total) by mouth daily. Patient not taking: Reported on 07/17/2018 01/07/15   Dhungel, Flonnie Overman, MD  fluconazole (DIFLUCAN) 200 MG tablet Take 2 tablets (400 mg total) by mouth daily. Patient not taking: Reported on 07/17/2018 05/09/16   Tommy Medal, Lavell Islam, MD    Family History History reviewed. No pertinent family history.  Social  History Social History   Tobacco Use  . Smoking status: Current Every Day Smoker    Packs/day: 0.50    Types: Cigarettes  . Smokeless tobacco: Never Used  Substance Use Topics  . Alcohol use: No  . Drug use: No     Allergies   Patient has no known allergies.   Review of Systems Review of Systems  Constitutional: Negative for chills and fever.  Cardiovascular: Positive for chest pain. Negative for leg swelling.  Gastrointestinal: Negative for abdominal pain, nausea and vomiting.  Musculoskeletal: Positive for arthralgias and myalgias.  Skin: Positive for color change.  Neurological: Positive for numbness.  All other systems reviewed and are negative.    Physical Exam Updated Vital Signs BP (!) 176/94   Pulse (!) 57   Temp 98.5 F (36.9 C) (Oral)   Resp 16   Ht 5\' 2"  (1.575 m)   Wt 55.8 kg   SpO2 100%   BMI 22.50 kg/m   Physical Exam Vitals signs and nursing note reviewed.  Constitutional:      General: She is not in acute distress.  Appearance: She is well-developed.  HENT:     Head: Normocephalic and atraumatic.  Eyes:     General:        Right eye: No discharge.        Left eye: No discharge.     Conjunctiva/sclera: Conjunctivae normal.     Pupils: Pupils are equal, round, and reactive to light.  Neck:     Musculoskeletal: Normal range of motion and neck supple.     Vascular: No JVD.     Trachea: No tracheal deviation.  Cardiovascular:     Rate and Rhythm: Normal rate.     Pulses: Normal pulses.     Heart sounds: Normal heart sounds.  Pulmonary:     Effort: Pulmonary effort is normal.     Breath sounds: Normal breath sounds.  Chest:     Chest wall: No tenderness.  Abdominal:     General: Bowel sounds are normal. There is no distension.     Tenderness: There is no abdominal tenderness. There is no guarding.  Musculoskeletal:        General: Tenderness present.     Right elbow: She exhibits decreased range of motion. She exhibits no  deformity. Tenderness found. Olecranon process tenderness noted.     Comments: 4/5 right biceps and triceps strength. Normal passive ROM with pain elicited with flexion of the right elbow and abduction of right shoulder.   Skin:    General: Skin is warm and dry.     Findings: Bruising present. No erythema.     Comments: See below images. There is swelling and ecchymosis to the right forearm with some palpable cords noted. No putting edema.   Neurological:     Mental Status: She is alert.     Comments: Sensation intact to soft touch of BUE. Good grip strength bilaterally.   Psychiatric:        Behavior: Behavior normal.          ED Treatments / Results  Labs (all labs ordered are listed, but only abnormal results are displayed) Labs Reviewed  D-DIMER, QUANTITATIVE (NOT AT [facility]) - Abnormal; Notable for the following components:      Result Value   D-Dimer, Quant 0.92 (*)    All other components within normal limits  CBC WITH DIFFERENTIAL/PLATELET - Abnormal; Notable for the following components:   RBC 3.70 (*)    Hemoglobin 11.4 (*)    HCT 34.9 (*)    All other components within normal limits  BASIC METABOLIC PANEL - Abnormal; Notable for the following components:   Glucose, Bld 103 (*)    BUN 7 (*)    All other components within normal limits  I-STAT TROPONIN, ED    EKG None  Radiology Dg Chest 2 View  Result Date: 07/17/2018 CLINICAL DATA:  Right arm swelling and bruising. EXAM: CHEST - 2 VIEW COMPARISON:  None. FINDINGS: Normal heart size. Tortuous atherosclerotic aorta without aneurysm. Moderate-sized hiatal hernia projects over the cardiac silhouette. Emphysematous hyperinflation of the lungs without alveolar consolidation, dominant mass, pulmonary edema nor effusion. No pneumothorax degenerative changes are present dorsal spine. IMPRESSION: 1. Emphysematous hyperinflation of the lungs. Aortic atherosclerosis 2. Moderate-sized hiatal hernia. 3. No active pulmonary  disease. Electronically Signed   By: Ashley Royalty M.D.   On: 07/17/2018 15:52   Dg Elbow Complete Right  Result Date: 07/17/2018 CLINICAL DATA:  RIGHT arm swelling and bruising at distal humerus for 4 days EXAM: RIGHT ELBOW - COMPLETE 3+ VIEW COMPARISON:  None FINDINGS: Osseous demineralization. Joint spaces preserved. No acute fracture, dislocation, or bone destruction. No elbow joint effusion. IMPRESSION: No acute osseous abnormalities. Electronically Signed   By: Lavonia Dana M.D.   On: 07/17/2018 15:49   Mr Humerus Right W Wo Contrast  Result Date: 07/17/2018 CLINICAL DATA:  Right arm bruising starting 4 days ago. Mass was apparent on a DVT study. EXAM: MRI OF THE RIGHT HUMERUS WITHOUT AND WITH CONTRAST TECHNIQUE: Multiplanar, multisequence MR imaging of the right humerus was performed before and after the administration of intravenous contrast. CONTRAST:  5 cc Gadavist COMPARISON:  Radiographs from 07/17/2018 FINDINGS: Bones/Joint/Cartilage No significant bony abnormality. Ligaments N/A Muscles and Tendons The tendon of the long head of the biceps is ruptured proximally, and muscle is considerably distally retracted. This leads to a serpentine, masslike appearance of the thickened and distally retracted biceps tendon and a lax appearance of the proximal portion of the long head of the biceps muscle. The short head of the biceps peers intact. There is considerable edema tracking along the expected course of the long head of biceps, with some edema tracking around the short head of the biceps. Soft tissues Subcutaneous edema tracking in the upper arm primarily anteriorly, medially, and laterally. IMPRESSION: 1. Rupture of the tendon of the proximal long head of the biceps, with considerable distal retraction by about 12 cm. Thickened bunched and of the distally retracted proximal tendon and muscle, with surrounding edema. There is also some regional subcutaneous edema. The short head of the biceps remains  intact. Electronically Signed   By: Van Clines M.D.   On: 07/17/2018 21:03   Dg Humerus Right  Result Date: 07/17/2018 CLINICAL DATA:  RIGHT arm swelling and bruising at distal humerus for 4 days EXAM: RIGHT HUMERUS - 2+ VIEW COMPARISON:  None FINDINGS: Osseous demineralization. Shoulder and elbow joint alignments normal. No acute fracture, dislocation, or bone destruction. Minimal degenerative changes at the [facility] joint. IMPRESSION: No acute RIGHT humeral abnormalities. Electronically Signed   By: Lavonia Dana M.D.   On: 07/17/2018 15:50   Ue Venous Duplex (mc & Wl 7 Am - 7 Pm)  Result Date: 07/17/2018 UPPER VENOUS STUDY  Indications: Pain, Erythema, and discoloration of upper arm Comparison Study: No prior study available Performing Technologist: Rudell Cobb  Examination Guidelines: A complete evaluation includes B-mode imaging, spectral Doppler, color Doppler, and power Doppler as needed of all accessible portions of each vessel. Bilateral testing is considered an integral part of a complete examination. Limited examinations for reoccurring indications may be performed as noted.  Right Findings: +----------+------------+----------+---------+-----------+-------+ RIGHT     CompressiblePropertiesPhasicitySpontaneousSummary +----------+------------+----------+---------+-----------+-------+ IJV           Full                 Yes       Yes            +----------+------------+----------+---------+-----------+-------+ Subclavian    Full                 Yes       Yes            +----------+------------+----------+---------+-----------+-------+ Axillary      Full                 Yes       Yes            +----------+------------+----------+---------+-----------+-------+ Brachial      Full  Yes       Yes            +----------+------------+----------+---------+-----------+-------+ Radial        Full                                           +----------+------------+----------+---------+-----------+-------+ Ulnar         Full                                          +----------+------------+----------+---------+-----------+-------+ Cephalic      Full                                          +----------+------------+----------+---------+-----------+-------+ Basilic       Full                 Yes       Yes            +----------+------------+----------+---------+-----------+-------+ The area of concern at upper arm: 2.82x2.30cm heterogenous structure with peripheral vasscular supply. Etiology unknown.  Left Findings: +----------+------------+----------+---------+-----------+-------+ LEFT      CompressiblePropertiesPhasicitySpontaneousSummary +----------+------------+----------+---------+-----------+-------+ Subclavian    Full                 Yes       Yes            +----------+------------+----------+---------+-----------+-------+  Summary:  Right: No evidence of deep vein thrombosis in the upper extremity. No evidence of superficial vein thrombosis in the upper extremity. No evidence of thrombosis in the subclavian.  The area of concern at upper arm: 2.82x2.30cm heterogenous structure with peripheral vasscular supply. Etiology unknown.  Left: No evidence of thrombosis in the subclavian.  *See table(s) above for measurements and observations.    Preliminary     Procedures Procedures (including critical care time)  Medications Ordered in ED Medications  morphine 4 MG/ML injection 4 mg (4 mg Intravenous Given 07/17/18 1610)  iopamidol (ISOVUE-370) 76 % injection (100 mLs  Contrast Given 07/17/18 1812)  gadobutrol (GADAVIST) 1 MMOL/ML injection 5 mL (5 mLs Intravenous Contrast Given 07/17/18 2043)     Initial Impression / Assessment and Plan / ED Course  I have reviewed the triage vital signs and the nursing notes.  Pertinent labs & imaging results that were available during my care of the patient were  reviewed by me and considered in my medical decision making (see chart for details).     Patient presenting with acute onset of ecchymosis and swelling to the right upper arm.  She is afebrile, hypertensive in the ED.  Instructed patient to monitor this at home follow-up with PCP for reevaluation if it remains persistently elevated.  She is decreased strength of biceps and triceps muscle groups on the right on examination.  She is neurovascularly intact otherwise.  Lab work reviewed by me shows no leukocytosis, mild anemia, no metabolic derangements.  Troponin is negative and she has no complaints of chest pain, doubt ACS or MI.  Her d-dimer was elevated.  Plan to obtain CTA chest for further evaluation.  DVT study of the right upper extremity was negative.  Radiographs are negative as well, with no evidence of acute osseous  abnormality.  No evidence of osteomyelitis, fracture, or secondary skin infection.  The DVT study did show a 2.82 x 2.30 cm heterogenous structure with peripheral vascular supply of unknown etiology.  Will obtain MRI for further evaluation.  MRI shows rupture of the tendon of the proximal long head of the biceps with considerable distal retraction by about 12 cm.  There is some surrounding edema.  No evidence of necrotizing fasciitis, septic joint, or osteomyelitis.  The patient denies recent treatment with fluoroquinolones or prolonged steroid use.  No known trauma.  IV access for the CTA was difficult to obtain and she required multiple attempts.  She does not wish to stay any longer for the CTA.  Generally I have a low suspicion of PE.  She had a recent history of pleuritic chest pain approximately 2 weeks ago but none recently.  She is not complaining of any shortness of breath and while in the ED she has not been tachycardic, hypoxic, or tachypneic.  She understands that she is assuming some risk and not pursuing the imaging but reports that she feels comfortable with discharge  home at this time and I think this is reasonable given she is overall well-appearing.  We will give a shoulder sling.RICE therapy indicated and discussed with patient.  She has an orthopedist at both [place] and [place] with whom she can follow-up.  Discussed tricked ED return precautions. Pt verbalized understanding of and agreement with plan and is safe for discharge home at this time.  No complaints prior to discharge.  Discussed with Dr. Wilson Singer who agrees with assessment and plan at this time.   Final Clinical Impressions(s) / ED Diagnoses   Final diagnoses:  Rupture of right biceps tendon, initial encounter    ED Discharge Orders    None       Debroah Baller 07/17/18 2211    Virgel Manifold, MD 07/17/18 2319

## 2018-07-17 NOTE — ED Notes (Signed)
ED Provider at bedside. 

## 2018-07-17 NOTE — Discharge Instructions (Addendum)
1. Medications: Alternate 600 mg of ibuprofen and [phone] mg of Tylenol every 3 hours as needed for pain. Do not exceed 4000 mg of Tylenol daily.  Take ibuprofen with food to avoid upset stomach issues.  2. Treatment: rest, ice, elevate and use shoulder sling, drink plenty of fluids, gentle stretching.  Make sure to take your arm out of the sling 2-3 times daily and do some gentle stretching exercises.  I have attached some exercises to paperwork but he can also YouTube search biceps tendon rupture physical therapy. 3. Follow Up: Please followup with orthopedics as directed or your PCP as soon as possible; I have attached information for the orthopedist on-call but you can follow-up with Dr. Marlou Sa at [facility] or the orthopedist you have seen at [facility]  Please return to the ER for worsening symptoms or other concerns such as worsening swelling, redness of the skin, fevers, loss of pulses, or loss of feeling.   If your blood pressure (BP) was elevated on multiple readings during this visit above 130 for the top number or above 80 for the bottom number, please have this repeated by your primary care provider within one month. You can also check your blood pressure when you are out at a pharmacy or grocery store. Many have machines that will check your blood pressure.  If your blood pressure remains elevated, please follow-up with your PCP.   We also decided together not to pursue a CT scan of the chest to rule out a blood clot (pulmonary embolus).  Please return to the ED immediately if you have any concerning signs or symptoms such as shortness of breath, pain with deep inspiration, chest pains, breaking out into a cold sweat, or passing out.

## 2018-07-17 NOTE — ED Notes (Signed)
Vascular at bedside

## 2018-07-17 NOTE — ED Notes (Signed)
Patient transported to CT 

## 2018-07-17 NOTE — ED Triage Notes (Signed)
Pt  Here from home with c/o right arm bruising and swelling noted . Radial pulses palpable

## 2018-07-17 NOTE — Progress Notes (Signed)
Right upper extremity venous duplex exam completed. Negative for DVT study.  The area of concern at upper arm: 2.82x2.30cm heterogenous structure with peripheral vasscular supply. Etiology unknown.  Please see preliminary notes on CV PROC under chart review for more details./ Collyn Selk H Sameeha Rockefeller(RDMS RVT) 07/17/18 4:47 PM

## 2018-07-17 NOTE — ED Notes (Signed)
IV team at bedside 

## 2018-07-17 NOTE — ED Notes (Signed)
Patient verbalizes understanding of discharge instructions. Opportunity for questioning and answers were provided. Armband removed by staff, pt discharged from ED. Shoulder immobilzer applied. Pt ambulatory to lobby.

## 2018-07-17 NOTE — ED Notes (Addendum)
Per CT, second IV also infiltrated, ct to return pt to room. EDP notified.

## 2019-01-09 ENCOUNTER — Encounter (HOSPITAL_COMMUNITY): Payer: Self-pay

## 2019-01-09 ENCOUNTER — Other Ambulatory Visit: Payer: Self-pay

## 2019-01-09 ENCOUNTER — Emergency Department (HOSPITAL_COMMUNITY): Payer: Medicare Other

## 2019-01-09 ENCOUNTER — Emergency Department (HOSPITAL_COMMUNITY)
Admission: EM | Admit: 2019-01-09 | Discharge: 2019-01-09 | Disposition: A | Discharge status: Home / Self Care | Payer: Medicare Other | Attending: Emergency Medicine | Admitting: Emergency Medicine

## 2019-01-09 DIAGNOSIS — F17210 Nicotine dependence, cigarettes, uncomplicated: Secondary | ICD-10-CM | POA: Insufficient documentation

## 2019-01-09 DIAGNOSIS — Z7982 Long term (current) use of aspirin: Secondary | ICD-10-CM | POA: Insufficient documentation

## 2019-01-09 DIAGNOSIS — Y999 Unspecified external cause status: Secondary | ICD-10-CM | POA: Insufficient documentation

## 2019-01-09 DIAGNOSIS — Y9389 Activity, other specified: Secondary | ICD-10-CM | POA: Diagnosis not present

## 2019-01-09 DIAGNOSIS — R609 Edema, unspecified: Secondary | ICD-10-CM

## 2019-01-09 DIAGNOSIS — X500XXA Overexertion from strenuous movement or load, initial encounter: Secondary | ICD-10-CM | POA: Insufficient documentation

## 2019-01-09 DIAGNOSIS — E039 Hypothyroidism, unspecified: Secondary | ICD-10-CM | POA: Insufficient documentation

## 2019-01-09 DIAGNOSIS — R52 Pain, unspecified: Secondary | ICD-10-CM

## 2019-01-09 DIAGNOSIS — M25572 Pain in left ankle and joints of left foot: Secondary | ICD-10-CM

## 2019-01-09 DIAGNOSIS — W19XXXA Unspecified fall, initial encounter: Secondary | ICD-10-CM

## 2019-01-09 DIAGNOSIS — Z79899 Other long term (current) drug therapy: Secondary | ICD-10-CM | POA: Diagnosis not present

## 2019-01-09 DIAGNOSIS — W109XXA Fall (on) (from) unspecified stairs and steps, initial encounter: Secondary | ICD-10-CM | POA: Insufficient documentation

## 2019-01-09 DIAGNOSIS — S99912A Unspecified injury of left ankle, initial encounter: Secondary | ICD-10-CM | POA: Diagnosis present

## 2019-01-09 DIAGNOSIS — S93402A Sprain of unspecified ligament of left ankle, initial encounter: Secondary | ICD-10-CM | POA: Diagnosis not present

## 2019-01-09 DIAGNOSIS — Y9289 Other specified places as the place of occurrence of the external cause: Secondary | ICD-10-CM | POA: Diagnosis not present

## 2019-01-09 MED ORDER — TRAMADOL HCL 50 MG PO TABS
50.0000 mg | ORAL_TABLET | Freq: Once | ORAL | Status: AC
  Administered 2019-01-09: 50 mg via ORAL
  Filled 2019-01-09: qty 1

## 2019-01-09 MED ORDER — NAPROXEN 500 MG PO TABS: 500.0000 mg | ORAL_TABLET | Freq: Two times a day (BID) | ORAL | 0 refills | Status: AC

## 2019-01-09 NOTE — ED Notes (Signed)
Ice applied to foot.

## 2019-01-09 NOTE — ED Notes (Signed)
Applied ice pack to pt's left foot/ankle

## 2019-01-09 NOTE — Progress Notes (Signed)
Orthopedic Tech Progress Note Patient Details:  Amy Hamilton [birth year] [id number]  Ortho Devices Type of Ortho Device: Ankle Air splint Ortho Device/Splint Location: LLE Ortho Device/Splint Interventions: Adjustment, Application, Ordered   Post Interventions Patient Tolerated: Well Instructions Provided: Care of device, Adjustment of device   Janit Pagan 01/09/2019, 3:22 PM

## 2019-01-09 NOTE — Discharge Instructions (Signed)
Your x-ray today showed possibility of a nondisplaced avulsion fracture, you will need to have x-rays done within 10 to 14 days to further evaluate this.  I have attached the number to Dr. Doreatha Martin, please schedule an appointment for further follow-up.  Your left ankle has been placed on a left Aircast, please keep this on while ambulating.

## 2019-01-09 NOTE — ED Provider Notes (Signed)
[facility] EMERGENCY DEPARTMENT Provider Note   CSN: [id number] Arrival date & time: 01/09/19  1220    History   Chief Complaint Chief Complaint  Patient presents with  . Ankle Injury    HPI Amy Hamilton is a 68 y.o. female.     68 y.o female with a PMH of DDD, Osteomyelitis, Sepsis, presents to the ED s/p left ankle injury x yesterday.  Patient reports she was walking down the steps when she felt her left ankle turn around 180 degrees backwards.  Patient reports pain along the dorsum aspect of the foot, pain is worse with pressure along with ambulation.  Patient has taken Tylenol for her symptoms but reports no improvement.  She reports increase in swelling to the area.  She denies any other injury.     Past Medical History:  Diagnosis Date  . Chronic back pain   . DDD (degenerative disc disease), cervical   . Depression   . Hardware complicating wound infection ([place]) 02/02/2016  . Osteomyelitis, 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[place]) 02/02/2016  . Routine screening for STI (sexually transmitted infection) 02/02/2016  . Thyroid disease   . Uterine prolapse     Patient Active Problem List   Diagnosis Date Noted  . Urinary tract infection due to extended-spectrum beta lactamase (ESBL) producing Escherichia coli 05/11/2016  . Sepsis ([place]) 05/10/2016  . Leukocytosis 05/10/2016  . Elevated troponin 05/09/2016  . Hypotension 05/09/2016  . Hardware complicating wound infection ([place]) 02/02/2016  . Osteomyelitis, 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[place]) 02/02/2016  . Routine screening for STI (sexually transmitted infection) 02/02/2016  . Chest pain, musculoskeletal 01/07/2015  . Postoperative hematoma 01/07/2015  . Acute carpal tunnel syndrome of left wrist 01/07/2015  . Hypokalemia 01/07/2015  . Acute blood loss anemia 01/07/2015  . Abnormal EKG 01/06/2015  . Abdominal pain, acute 01/06/2015  . Hyponatremia 01/06/2015  . Anemia 01/06/2015  . Closed left radial fracture 01/06/2015  . S/P  ORIF (open reduction internal fixation) fracture 01/06/2015  . Intertrochanteric fracture of left hip ([place]) 10/23/2012  . Hip fracture, left ([place]) 10/17/2012  . Fall at home 10/17/2012  . Hypothyroidism 10/17/2012  . Depression 10/17/2012  . Back pain 10/17/2012    Past Surgical History:  Procedure Laterality Date  . ABDOMINAL HYSTERECTOMY    . ANKLE SURGERY Right   . APPLICATION OF WOUND VAC Left 01/06/2015   Procedure: APPLICATION OF WOUND VAC;  Surgeon: Milly Jakob, MD;  Location: [facility];  Service: Orthopedics;  Laterality: Left;  . BACK SURGERY    . CARPAL TUNNEL RELEASE Left 01/06/2015   Procedure: CARPAL TUNNEL RELEASE;  Surgeon: Milly Jakob, MD;  Location: [facility];  Service: Orthopedics;  Laterality: Left;  . FEMUR IM NAIL Left 10/18/2012   Procedure: INTRAMEDULLARY (IM) NAIL FEMORAL;  Surgeon: Meredith Pel, MD;  Location: [facility];  Service: Orthopedics;  Laterality: Left;  . I&D EXTREMITY Left 01/06/2015   Procedure: IRRIGATION AND DEBRIDEMENT EXTREMITY, EVACUATION HEMATOMA;  Surgeon: Milly Jakob, MD;  Location: [facility];  Service: Orthopedics;  Laterality: Left;  . ORIF ULNAR FRACTURE Left 01/04/2015   Procedure: OPEN REDUCTION INTERNAL FIXATION (ORIF) ULNAR FRACTURE;  Surgeon: Milly Jakob, MD;  Location: [facility];  Service: Orthopedics;  Laterality: Left;  . SECONDARY CLOSURE OF WOUND Left 01/15/2015   Procedure: SECONDARY CLOSURE OF WOUND LEFT FOREARM;  Surgeon: Milly Jakob, MD;  Location: [facility];  Service: Orthopedics;  Laterality: Left;     OB History   No obstetric  history on file.      Home Medications    Prior to Admission medications   Medication Sig Start Date End Date Taking? Authorizing Provider  albuterol (PROVENTIL HFA;VENTOLIN HFA) 108 (90 BASE) MCG/ACT inhaler Inhale 1-2 puffs into the lungs every 6 (six) hours as needed for wheezing or shortness of breath. Patient taking differently: Inhale 2 puffs into the lungs every 6 (six)  hours as needed for wheezing or shortness of breath.  10/26/12   Angiulli, Lavon Paganini, PA-C  aspirin EC 81 MG EC tablet Take 1 tablet (81 mg total) by mouth daily. Patient not taking: Reported on 07/17/2018 01/07/15   Dhungel, Flonnie Overman, MD  estradiol (ESTRACE) 0.5 MG tablet Take 0.5 mg by mouth daily at 12 noon.    [provider]  fluconazole (DIFLUCAN) 200 MG tablet Take 2 tablets (400 mg total) by mouth daily. Patient not taking: Reported on 07/17/2018 05/09/16   Tommy Medal, Lavell Islam, MD  FLUoxetine (PROZAC) 40 MG capsule Take 2 capsules (80 mg total) by mouth daily. Patient taking differently: Take 80 mg by mouth every morning.  10/26/12   Angiulli, Lavon Paganini, PA-C  fluticasone (FLONASE) 50 MCG/ACT nasal spray Place 2 sprays into both nostrils daily as needed for allergies or rhinitis. 05/13/16   Janece Canterbury, MD  levothyroxine (SYNTHROID, LEVOTHROID) 88 MCG tablet Take 88 mcg by mouth daily before breakfast.    [provider]  lisdexamfetamine (VYVANSE) 70 MG capsule Take 70 mg by mouth daily.    [provider]  naproxen (NAPROSYN) 500 MG tablet Take 1 tablet (500 mg total) by mouth 2 (two) times daily for 7 days. 01/09/19 01/16/19  Janeece Fitting, PA-C    Family History History reviewed. No pertinent family history.  Social History Social History   Tobacco Use  . Smoking status: Current Every Day Smoker    Packs/day: 0.25    Types: Cigarettes  . Smokeless tobacco: Never Used  Substance Use Topics  . Alcohol use: No  . Drug use: No     Allergies   Patient has no known allergies.   Review of Systems Review of Systems  Constitutional: Negative for fever.  Musculoskeletal: Positive for arthralgias and joint swelling.     Physical Exam Updated Vital Signs BP 118/87   Pulse 67   Temp 98.7 F (37.1 C) (Oral)   Resp 18   Ht 5\' 2"  (1.575 m)   Wt 52.6 kg   SpO2 96%   BMI 21.22 kg/m   Physical Exam Vitals signs and nursing note reviewed.   Constitutional:      General: She is not in acute distress.    Appearance: She is well-developed.  HENT:     Head: Normocephalic and atraumatic.     Mouth/Throat:     Pharynx: No oropharyngeal exudate.  Eyes:     Pupils: Pupils are equal, round, and reactive to light.  Neck:     Musculoskeletal: Normal range of motion.  Cardiovascular:     Rate and Rhythm: Regular rhythm.     Heart sounds: Normal heart sounds.  Pulmonary:     Effort: Pulmonary effort is normal. No respiratory distress.     Breath sounds: Normal breath sounds.  Abdominal:     General: Bowel sounds are normal. There is no distension.     Palpations: Abdomen is soft.     Tenderness: There is no abdominal tenderness.  Musculoskeletal:        General: No deformity.  Right lower leg: No edema.     Left lower leg: No edema.     Left foot: Decreased range of motion. Normal capillary refill. Tenderness and swelling present. No crepitus, deformity or laceration.     Comments: Decreased range of motion due to pain, capillary refill is intact.  Pulses present.  Tenderness to palpation is worse along the medial aspect.  Skin:    General: Skin is warm and dry.  Neurological:     Mental Status: She is alert and oriented to person, place, and time.      ED Treatments / Results  Labs (all labs ordered are listed, but only abnormal results are displayed) Labs Reviewed - No data to display  EKG None  Radiology Dg Ankle Complete Left  Result Date: 01/09/2019 CLINICAL DATA:  Generalized left ankle pain and bruising after falling down stairs today. EXAM: LEFT ANKLE COMPLETE - 3+ VIEW COMPARISON:  None. FINDINGS: There is soft tissue swelling about the ankle. There is a slight irregularity of the insertion site of the extensor digitorum brevis. There is a small well corticated osseous fragment adjacent to the distal fibula which is favored to be secondary to an old remote injury. IMPRESSION: Soft tissue swelling about the  ankle with a subtle irregularity at the insertion site of the extensor digitorum brevis could represent a nondisplaced avulsion fracture in the appropriate clinical setting. This is only appreciated on a single view. Repeat radiographs in 10-14 days may be useful for further evaluation. Electronically Signed   By: Constance Holster M.D.   On: 01/09/2019 13:53   Dg Foot Complete Left  Result Date: 01/09/2019 CLINICAL DATA:  Pain status post fall EXAM: LEFT FOOT - COMPLETE 3+ VIEW COMPARISON:  None. FINDINGS: There is soft tissue swelling about the foot and ankle without evidence of a displaced fracture involving the osseous structures of the foot. The osseous mineralization is within normal limits. IMPRESSION: Soft tissue swelling without evidence of an acute displaced fracture or dislocation involving the osseous structures of the foot. See separate dictation for complete ankle findings. Electronically Signed   By: Constance Holster M.D.   On: 01/09/2019 13:54    Procedures Procedures (including critical care time)  Medications Ordered in ED Medications - No data to display   Initial Impression / Assessment and Plan / ED Course  I have reviewed the triage vital signs and the nursing notes.  Pertinent labs & imaging results that were available during my care of the patient were reviewed by me and considered in my medical decision making (see chart for details).    Patient with a past medical history of osteomyelitis presents to the ED with complaints of left ankle pain after attempting to walk down the steps and having her foot invert while going down steps.  She reports swelling to the area along with worsening pain.  Patient has been taking Tylenol for her pain, reports no improvement in symptoms.  During initial evaluation patient is non-ill-appearing, neurovascularly intact, does have limited range of motion due to pain, significant swelling appreciated to the left ankle.  Will obtain  x-ray of the left foot to further evaluate this.  Left foot x-ray showed no evidence of displaced fracture or dislocation.  However left ankle x-ray showed the possibility of an acute nondisplaced avulsion fracture.  Patient is recommended to follow-up with Dr. Doreatha Martin of orthopedics as she will need repeat x-rays within 10 to 14 days.  Patient will also be sent home with  a prescription for naproxen.  Rice therapy is recommended.  Patient will also have her left foot placed on an Aircast.  Return precautions provided at length.   Portions of this note were generated with [employer]. Dictation errors may occur despite best attempts at proofreading.   Final Clinical Impressions(s) / ED Diagnoses   Final diagnoses:  Sprain of left ankle, unspecified ligament, initial encounter  Acute left ankle pain    ED Discharge Orders         Ordered    naproxen (NAPROSYN) 500 MG tablet  2 times daily     01/09/19 1450           Janeece Fitting, PA-C 01/09/19 1452    Drenda Freeze, MD 01/10/19 [phone]

## 2019-01-09 NOTE — ED Triage Notes (Signed)
Spoke with Venetia Night who advised it was OK to send this patient to [facility].

## 2019-01-09 NOTE — ED Triage Notes (Signed)
Pt was walking down steps and missed last step. Her left foot turned backwards. Left ankle/foot has 2+ edema, 10/10 pain. 2+ left pedal pulse. Pt able to wiggle toes, cap refilll less than 3 sec.

## 2019-01-14 ENCOUNTER — Other Ambulatory Visit: Payer: Self-pay | Admitting: Family Medicine

## 2019-01-14 DIAGNOSIS — Z87891 Personal history of nicotine dependence: Secondary | ICD-10-CM

## 2019-05-18 DIAGNOSIS — G8929 Other chronic pain: Secondary | ICD-10-CM | POA: Insufficient documentation

## 2019-05-18 DIAGNOSIS — M25551 Pain in right hip: Secondary | ICD-10-CM | POA: Insufficient documentation

## 2019-05-18 DIAGNOSIS — F17210 Nicotine dependence, cigarettes, uncomplicated: Secondary | ICD-10-CM | POA: Insufficient documentation

## 2019-05-18 HISTORY — DX: Other chronic pain: G89.29

## 2019-05-18 HISTORY — DX: Pain in right hip: M25.551

## 2019-05-18 HISTORY — DX: Nicotine dependence, cigarettes, uncomplicated: F17.210

## 2019-09-02 DIAGNOSIS — H40033 Anatomical narrow angle, bilateral: Secondary | ICD-10-CM | POA: Diagnosis not present

## 2019-09-02 DIAGNOSIS — H5203 Hypermetropia, bilateral: Secondary | ICD-10-CM | POA: Diagnosis not present

## 2020-12-22 DIAGNOSIS — H02831 Dermatochalasis of right upper eyelid: Secondary | ICD-10-CM

## 2020-12-22 HISTORY — DX: Dermatochalasis of right upper eyelid: H02.831

## 2021-05-19 ENCOUNTER — Emergency Department (HOSPITAL_COMMUNITY): Payer: Medicare Other

## 2021-05-19 ENCOUNTER — Other Ambulatory Visit: Payer: Self-pay

## 2021-05-19 ENCOUNTER — Encounter (HOSPITAL_COMMUNITY): Payer: Self-pay | Admitting: *Deleted

## 2021-05-19 ENCOUNTER — Emergency Department (HOSPITAL_COMMUNITY)
Admission: EM | Admit: 2021-05-19 | Discharge: 2021-05-19 | Disposition: A | Discharge status: Home / Self Care | Payer: Medicare Other | Attending: Emergency Medicine | Admitting: Emergency Medicine

## 2021-05-19 DIAGNOSIS — R079 Chest pain, unspecified: Secondary | ICD-10-CM | POA: Insufficient documentation

## 2021-05-19 DIAGNOSIS — R918 Other nonspecific abnormal finding of lung field: Secondary | ICD-10-CM | POA: Diagnosis not present

## 2021-05-19 DIAGNOSIS — R0602 Shortness of breath: Secondary | ICD-10-CM | POA: Diagnosis not present

## 2021-05-19 DIAGNOSIS — Z5321 Procedure and treatment not carried out due to patient leaving prior to being seen by health care provider: Secondary | ICD-10-CM | POA: Diagnosis not present

## 2021-05-19 DIAGNOSIS — R9389 Abnormal findings on diagnostic imaging of other specified body structures: Secondary | ICD-10-CM

## 2021-05-19 DIAGNOSIS — R112 Nausea with vomiting, unspecified: Secondary | ICD-10-CM | POA: Diagnosis present

## 2021-05-19 DIAGNOSIS — N390 Urinary tract infection, site not specified: Secondary | ICD-10-CM | POA: Diagnosis not present

## 2021-05-19 LAB — URINALYSIS, ROUTINE W REFLEX MICROSCOPIC
Bilirubin Urine: NEGATIVE
Glucose, UA: NEGATIVE mg/dL
Ketones, ur: NEGATIVE mg/dL
Nitrite: POSITIVE — AB
Protein, ur: NEGATIVE mg/dL
Specific Gravity, Urine: 1.012 (ref 1.005–1.030)
pH: 6 (ref 5.0–8.0)

## 2021-05-19 LAB — COMPREHENSIVE METABOLIC PANEL
ALT: 8 U/L (ref 0–44)
AST: 17 U/L (ref 15–41)
Albumin: 3.6 g/dL (ref 3.5–5.0)
Alkaline Phosphatase: 62 U/L (ref 38–126)
Anion gap: 6 (ref 5–15)
BUN: 12 mg/dL (ref 8–23)
CO2: 27 mmol/L (ref 22–32)
Calcium: 9.3 mg/dL (ref 8.9–10.3)
Chloride: 104 mmol/L (ref 98–111)
Creatinine, Ser: 0.72 mg/dL (ref 0.44–1.00)
GFR, Estimated: >60 mL/min (ref >60)
Glucose, Bld: 108 mg/dL — ABNORMAL HIGH (ref 70–99)
Potassium: 4.3 mmol/L (ref 3.5–5.1)
Sodium: 137 mmol/L (ref 135–145)
Total Bilirubin: 0.4 mg/dL (ref 0.3–1.2)
Total Protein: 6.5 g/dL (ref 6.5–8.1)

## 2021-05-19 LAB — TROPONIN I (HIGH SENSITIVITY)
Troponin I (High Sensitivity): 14 ng/L (ref <18)
Troponin I (High Sensitivity): 25 ng/L — ABNORMAL HIGH (ref <18)

## 2021-05-19 LAB — CBC
HCT: 38.7 % (ref 36.0–46.0)
Hemoglobin: 12.7 g/dL (ref 12.0–15.0)
MCH: 30.8 pg (ref 26.0–34.0)
MCHC: 32.8 g/dL (ref 30.0–36.0)
MCV: 93.9 fL (ref 80.0–100.0)
Platelets: 329 10*3/uL (ref 150–400)
RBC: 4.12 MIL/uL (ref 3.87–5.11)
RDW: 12 % (ref 11.5–15.5)
WBC: 8.1 10*3/uL (ref 4.0–10.5)
nRBC: 0 % (ref 0.0–0.2)

## 2021-05-19 LAB — LIPASE, BLOOD: Lipase: 26 U/L (ref 11–51)

## 2021-05-19 NOTE — ED Triage Notes (Signed)
Pt reports left side sharp chest pains with cough and sob. Pain increases with movement and breathing. No acute resp distress noted at triage.

## 2021-05-19 NOTE — ED Notes (Signed)
Patient called x2 for vitals recheck with no response

## 2021-05-19 NOTE — ED Provider Notes (Signed)
Emergency Medicine Provider Triage Evaluation Note  Amy Hamilton , a 70 y.o. female  was evaluated in triage.  Pt complains of chest pain, abdominal pain, vomiting x3 days.  Patient states that her chest pain is worse in the middle, with some radiation on her left side.  Pain is worse with movement and breathing.  She also notes right lower quadrant pain, with right flank pain.  She is not been taking anything for her symptoms.  She notes that she has had 4-5 episodes of emesis per day, and is unable to keep down fluids or food.  Review of Systems  Positive: Chest pain, abdominal pain, increased urinary frequency, nausea, vomiting, nonproductive cough, shortness of breath, chills Negative: Diarrhea, dysuria, hematuria  Physical Exam  There were no vitals taken for this visit. Gen:   Awake, no distress   Resp:  Normal effort  MSK:   Moves extremities without difficulty  Other:  Abdomen soft, nontender nondistended, lung sounds clear  Medical Decision Making  Medically screening exam initiated at 2:20 PM.  Appropriate orders placed.  Amy Hamilton was informed that the remainder of the evaluation will be completed by another provider, this initial triage assessment does not replace that evaluation, and the importance of remaining in the ED until their evaluation is complete.     Estill Cotta 05/19/21 1422    Pattricia Boss, MD 05/21/21 1120

## 2021-05-21 ENCOUNTER — Telehealth (HOSPITAL_COMMUNITY): Payer: Self-pay | Admitting: Emergency Medicine

## 2021-05-21 NOTE — Telephone Encounter (Signed)
Reviewed chart from yesterday Urinalysis consistent with urinary tract infection Chest x-Jaesean Litzau concerning for mass that will need outpatient follow-up Attempted to call patient at [phone], no answering service picked up, therefore no ability to leave message

## 2021-06-14 ENCOUNTER — Other Ambulatory Visit: Payer: Self-pay | Admitting: Family Medicine

## 2021-06-21 ENCOUNTER — Other Ambulatory Visit: Payer: Self-pay | Admitting: Family Medicine

## 2021-06-21 ENCOUNTER — Encounter (HOSPITAL_COMMUNITY): Payer: Self-pay | Admitting: Radiology

## 2021-06-21 DIAGNOSIS — R918 Other nonspecific abnormal finding of lung field: Secondary | ICD-10-CM

## 2021-07-23 ENCOUNTER — Other Ambulatory Visit: Payer: Self-pay

## 2021-07-23 ENCOUNTER — Encounter (HOSPITAL_COMMUNITY): Payer: Self-pay | Admitting: *Deleted

## 2021-07-23 ENCOUNTER — Emergency Department (HOSPITAL_COMMUNITY)
Admission: EM | Admit: 2021-07-23 | Discharge: 2021-07-23 | Disposition: A | Discharge status: Home / Self Care | Payer: Medicare Other | Attending: Emergency Medicine | Admitting: Emergency Medicine

## 2021-07-23 DIAGNOSIS — Z96642 Presence of left artificial hip joint: Secondary | ICD-10-CM | POA: Diagnosis not present

## 2021-07-23 DIAGNOSIS — Z7982 Long term (current) use of aspirin: Secondary | ICD-10-CM | POA: Diagnosis not present

## 2021-07-23 DIAGNOSIS — F17210 Nicotine dependence, cigarettes, uncomplicated: Secondary | ICD-10-CM | POA: Insufficient documentation

## 2021-07-23 DIAGNOSIS — M5441 Lumbago with sciatica, right side: Secondary | ICD-10-CM | POA: Insufficient documentation

## 2021-07-23 DIAGNOSIS — Z79899 Other long term (current) drug therapy: Secondary | ICD-10-CM | POA: Insufficient documentation

## 2021-07-23 DIAGNOSIS — E039 Hypothyroidism, unspecified: Secondary | ICD-10-CM | POA: Insufficient documentation

## 2021-07-23 DIAGNOSIS — M5431 Sciatica, right side: Secondary | ICD-10-CM

## 2021-07-23 MED ORDER — LIDOCAINE 5 % EX PTCH: 1.0000 | MEDICATED_PATCH | CUTANEOUS | 0 refills | Status: DC

## 2021-07-23 MED ORDER — NAPROXEN 250 MG PO TABS
500.0000 mg | ORAL_TABLET | Freq: Once | ORAL | Status: AC
  Administered 2021-07-23: 16:00:00 500 mg via ORAL
  Filled 2021-07-23: qty 2

## 2021-07-23 MED ORDER — CYCLOBENZAPRINE HCL 10 MG PO TABS: 5.0000 mg | ORAL_TABLET | Freq: Two times a day (BID) | ORAL | 0 refills | Status: DC | PRN

## 2021-07-23 NOTE — ED Provider Notes (Addendum)
[facility] EMERGENCY DEPARTMENT Provider Note   CSN: [id number] Arrival date & time: 07/23/21  1440     History Chief Complaint  Patient presents with   Back Pain    Amy Hamilton is a 70 y.o. female who presents to the emergency department with right lower back pain started this morning.  Patient does have a history of chronic back problems and slipped disks.  Patient states that Amy Hamilton fell asleep on the couch last night woke up on the floor this morning and had pain.  Pain radiates down the right leg that Amy Hamilton describes as a shooting sensation.  Amy Hamilton rates her back pain severe in severity.  Her pain is not improved with any Tylenol. No bowel/bladder incontinence.    Back Pain     Past Medical History:  Diagnosis Date   Chronic back pain    DDD (degenerative disc disease), cervical    Depression    Hardware complicating wound infection ([place]) 02/02/2016   Osteomyelitis, 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[place]) 02/02/2016   Routine screening for STI (sexually transmitted infection) 02/02/2016   Thyroid disease    Uterine prolapse     Patient Active Problem List   Diagnosis Date Noted   Urinary tract infection due to extended-spectrum beta lactamase (ESBL) producing Escherichia coli 05/11/2016   Sepsis ([place]) 05/10/2016   Leukocytosis 05/10/2016   Elevated troponin 05/09/2016   Hypotension 35/00/9381   Hardware complicating wound infection ([place]) 02/02/2016   Osteomyelitis, 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[place]) 02/02/2016   Routine screening for STI (sexually transmitted infection) 02/02/2016   Chest pain, musculoskeletal 01/07/2015   Postoperative hematoma 01/07/2015   Acute carpal tunnel syndrome of left wrist 01/07/2015   Hypokalemia 01/07/2015   Acute blood loss anemia 01/07/2015   Abnormal EKG 01/06/2015   Abdominal pain, acute 01/06/2015   Hyponatremia 01/06/2015   Anemia 01/06/2015   Closed left radial fracture 01/06/2015   S/P ORIF (open reduction internal fixation) fracture  01/06/2015   Intertrochanteric fracture of left hip ([place]) 10/23/2012   Hip fracture, left ([place]) 10/17/2012   Fall at home 10/17/2012   Hypothyroidism 10/17/2012   Depression 10/17/2012   Back pain 10/17/2012    Past Surgical History:  Procedure Laterality Date   ABDOMINAL HYSTERECTOMY     ANKLE SURGERY Right    APPLICATION OF WOUND VAC Left 01/06/2015   Procedure: APPLICATION OF WOUND VAC;  Surgeon: Milly Jakob, MD;  Location: [facility];  Service: Orthopedics;  Laterality: Left;   BACK SURGERY     CARPAL TUNNEL RELEASE Left 01/06/2015   Procedure: CARPAL TUNNEL RELEASE;  Surgeon: Milly Jakob, MD;  Location: [facility];  Service: Orthopedics;  Laterality: Left;   FEMUR IM NAIL Left 10/18/2012   Procedure: INTRAMEDULLARY (IM) NAIL FEMORAL;  Surgeon: Meredith Pel, MD;  Location: [facility];  Service: Orthopedics;  Laterality: Left;   I & D EXTREMITY Left 01/06/2015   Procedure: IRRIGATION AND DEBRIDEMENT EXTREMITY, EVACUATION HEMATOMA;  Surgeon: Milly Jakob, MD;  Location: [facility];  Service: Orthopedics;  Laterality: Left;   ORIF ULNAR FRACTURE Left 01/04/2015   Procedure: OPEN REDUCTION INTERNAL FIXATION (ORIF) ULNAR FRACTURE;  Surgeon: Milly Jakob, MD;  Location: [facility];  Service: Orthopedics;  Laterality: Left;   SECONDARY CLOSURE OF WOUND Left 01/15/2015   Procedure: SECONDARY CLOSURE OF WOUND LEFT FOREARM;  Surgeon: Milly Jakob, MD;  Location: [facility];  Service: Orthopedics;  Laterality: Left;   TOTAL HIP ARTHROPLASTY Left      OB History  No obstetric history on file.     No family history on file.  Social History   Tobacco Use   Smoking status: Every Day    Packs/day: 0.25    Types: Cigarettes   Smokeless tobacco: Never  Substance Use Topics   Alcohol use: No   Drug use: No    Home Medications Prior to Admission medications   Medication Sig Start Date End Date Taking? Authorizing Provider  cyclobenzaprine (FLEXERIL) 10 MG tablet Take 0.5  tablets (5 mg total) by mouth 2 (two) times daily as needed for muscle spasms. 07/23/21  Yes Girtha Kilgore M, PA-C  lidocaine (LIDODERM) 5 % Place 1 patch onto the skin daily. Remove & Discard patch within 12 hours or as directed by MD 07/23/21  Yes Raul Del, Lavita Pontius M, PA-C  albuterol (PROVENTIL HFA;VENTOLIN HFA) 108 (90 BASE) MCG/ACT inhaler Inhale 1-2 puffs into the lungs every 6 (six) hours as needed for wheezing or shortness of breath. Patient taking differently: Inhale 2 puffs into the lungs every 6 (six) hours as needed for wheezing or shortness of breath.  10/26/12   Angiulli, Lavon Paganini, PA-C  aspirin EC 81 MG EC tablet Take 1 tablet (81 mg total) by mouth daily. Patient not taking: Reported on 07/17/2018 01/07/15   Dhungel, Flonnie Overman, MD  estradiol (ESTRACE) 0.5 MG tablet Take 0.5 mg by mouth daily at 12 noon.    [provider]  fluconazole (DIFLUCAN) 200 MG tablet Take 2 tablets (400 mg total) by mouth daily. Patient not taking: Reported on 07/17/2018 05/09/16   Tommy Medal, Lavell Islam, MD  FLUoxetine (PROZAC) 40 MG capsule Take 2 capsules (80 mg total) by mouth daily. Patient taking differently: Take 80 mg by mouth every morning.  10/26/12   Angiulli, Lavon Paganini, PA-C  fluticasone (FLONASE) 50 MCG/ACT nasal spray Place 2 sprays into both nostrils daily as needed for allergies or rhinitis. 05/13/16   Janece Canterbury, MD  levothyroxine (SYNTHROID, LEVOTHROID) 88 MCG tablet Take 88 mcg by mouth daily before breakfast.    [provider]  lisdexamfetamine (VYVANSE) 70 MG capsule Take 70 mg by mouth daily.    [provider]    Allergies    Patient has no known allergies.  Review of Systems   Review of Systems  Musculoskeletal:  Positive for back pain.  All other systems reviewed and are negative.  Physical Exam Updated Vital Signs BP (!) 146/75 (BP Location: Right Arm)    Pulse 60    Temp 99 F (37.2 C)    Resp 16    Ht 5\' 2"  (1.575 m)    Wt 52.6 kg    SpO2 100%     BMI 21.21 kg/m   Physical Exam Vitals and nursing note reviewed.  Constitutional:      Appearance: Normal appearance.  HENT:     Head: Normocephalic and atraumatic.  Eyes:     General:        Right eye: No discharge.        Left eye: No discharge.     Conjunctiva/sclera: Conjunctivae normal.  Pulmonary:     Effort: Pulmonary effort is normal.  Musculoskeletal:     Comments: Symmetrical strength bilaterally.  Normal sensation bilaterally.  There is no tenderness over the cervical, thoracic, or lumbar spine.  There was moderate paralumbar muscular tenderness.  Positive straight leg raise on the right.  Skin:    General: Skin is warm and dry.     Findings: No rash.  Neurological:     General: No focal deficit present.     Mental Status: Amy Hamilton is alert.  Psychiatric:        Mood and Affect: Mood normal.        Behavior: Behavior normal.    ED Results / Procedures / Treatments   Labs (all labs ordered are listed, but only abnormal results are displayed) Labs Reviewed - No data to display  EKG None  Radiology No results found.  Procedures Procedures   Medications Ordered in ED Medications  naproxen (NAPROSYN) tablet 500 mg (500 mg Oral Given 07/23/21 1602)    ED Course  I have reviewed the triage vital signs and the nursing notes.  Pertinent labs & imaging results that were available during my care of the patient were reviewed by me and considered in my medical decision making (see chart for details).    MDM Rules/Calculators/A&P                          SINIYAH EVANGELIST is a 70 y.o. female who presents to the emergency department for evaluation of back pain.  History and physical exam is consistent with sciatica.  We will give the patient a dose of naproxen in the emergency department today.  Recent lab work showed that her kidney function was normal.  We will also prescribe her some muscle relaxants and lidocaine patches.  I do not feel that imaging is warranted at  this time as there is no midline tenderness and Amy Hamilton has not had any systemic symptoms.  I considered however think pyelonephritis, kidney stone, cauda equina, infectious etiologies, malignancy are less likely at this time. Patient was amendable to this plan.  Strict turn precautions given.  Amy Hamilton is safe for discharge.     Final Clinical Impression(s) / ED Diagnoses Final diagnoses:  Sciatica of right side    Rx / DC Orders ED Discharge Orders          Ordered    cyclobenzaprine (FLEXERIL) 10 MG tablet  2 times daily PRN        07/23/21 1600    lidocaine (LIDODERM) 5 %  Every 24 hours        07/23/21 1600             Myna Bright [place], [place] 07/23/21 1602    Myna Bright M, PA-C 07/23/21 1603    Regan Lemming, MD 07/23/21 [phone]

## 2021-07-23 NOTE — Discharge Instructions (Addendum)
This is likely sciatica.  This will resolve on its own.  Please take Flexeril which is a muscle relaxant twice daily as needed for muscle spasm.  You can also apply lidocaine patch to your back.  Please return to the emergency department if you experience worsening pain, trouble walking, loss of bowel or bladder, weakness or numbness to your lower legs, fevers, chills, or any other concerns you might have.

## 2021-07-23 NOTE — ED Triage Notes (Signed)
The pt fell asleep on the cough last pm and found  herself in the floor this am   she is c/o total back pain hx back pain

## 2021-07-23 NOTE — ED Notes (Signed)
Pt verbalized understanding of discharge paperwork and prescriptions.

## 2021-08-04 ENCOUNTER — Emergency Department (HOSPITAL_COMMUNITY)
Admission: EM | Admit: 2021-08-04 | Discharge: 2021-08-04 | Disposition: A | Discharge status: Home / Self Care | Payer: Medicare Other | Attending: Emergency Medicine | Admitting: Emergency Medicine

## 2021-08-04 ENCOUNTER — Encounter (HOSPITAL_COMMUNITY): Payer: Self-pay | Admitting: Emergency Medicine

## 2021-08-04 ENCOUNTER — Emergency Department (HOSPITAL_COMMUNITY): Payer: Medicare Other

## 2021-08-04 ENCOUNTER — Other Ambulatory Visit: Payer: Self-pay

## 2021-08-04 DIAGNOSIS — S60931A Unspecified superficial injury of right thumb, initial encounter: Secondary | ICD-10-CM | POA: Insufficient documentation

## 2021-08-04 DIAGNOSIS — W230XXA Caught, crushed, jammed, or pinched between moving objects, initial encounter: Secondary | ICD-10-CM | POA: Insufficient documentation

## 2021-08-04 DIAGNOSIS — Z5321 Procedure and treatment not carried out due to patient leaving prior to being seen by health care provider: Secondary | ICD-10-CM | POA: Insufficient documentation

## 2021-08-04 MED ORDER — OXYCODONE-ACETAMINOPHEN 5-325 MG PO TABS
1.0000 | ORAL_TABLET | ORAL | Status: DC | PRN
  Administered 2021-08-04: 1 via ORAL
  Filled 2021-08-04: qty 1

## 2021-08-04 NOTE — ED Notes (Signed)
Patient states her ride is here and she is leaving d/t wait time

## 2021-08-04 NOTE — ED Notes (Signed)
Patient states her ride is on the way and she is leaving soon
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
## 2021-11-08 DIAGNOSIS — R079 Chest pain, unspecified: Secondary | ICD-10-CM

## 2021-11-08 DIAGNOSIS — I340 Nonrheumatic mitral (valve) insufficiency: Secondary | ICD-10-CM

## 2021-12-06 ENCOUNTER — Other Ambulatory Visit: Payer: Self-pay | Admitting: Nurse Practitioner

## 2021-12-06 DIAGNOSIS — R4182 Altered mental status, unspecified: Secondary | ICD-10-CM

## 2021-12-13 ENCOUNTER — Other Ambulatory Visit: Payer: Self-pay

## 2021-12-13 ENCOUNTER — Ambulatory Visit (INDEPENDENT_AMBULATORY_CARE_PROVIDER_SITE_OTHER): Payer: Medicare Other | Admitting: Cardiology

## 2021-12-13 ENCOUNTER — Other Ambulatory Visit: Payer: Self-pay | Admitting: Family Medicine

## 2021-12-13 VITALS — BP 134/70 | HR 62 | Ht 62.0 in | Wt 105.2 lb

## 2021-12-13 DIAGNOSIS — F419 Anxiety disorder, unspecified: Secondary | ICD-10-CM | POA: Insufficient documentation

## 2021-12-13 DIAGNOSIS — I4891 Unspecified atrial fibrillation: Secondary | ICD-10-CM | POA: Insufficient documentation

## 2021-12-13 DIAGNOSIS — J431 Panlobular emphysema: Secondary | ICD-10-CM | POA: Diagnosis not present

## 2021-12-13 DIAGNOSIS — Z87891 Personal history of nicotine dependence: Secondary | ICD-10-CM | POA: Insufficient documentation

## 2021-12-13 DIAGNOSIS — I5189 Other ill-defined heart diseases: Secondary | ICD-10-CM | POA: Insufficient documentation

## 2021-12-13 DIAGNOSIS — J449 Chronic obstructive pulmonary disease, unspecified: Secondary | ICD-10-CM | POA: Insufficient documentation

## 2021-12-13 DIAGNOSIS — I480 Paroxysmal atrial fibrillation: Secondary | ICD-10-CM | POA: Insufficient documentation

## 2021-12-13 DIAGNOSIS — J45909 Unspecified asthma, uncomplicated: Secondary | ICD-10-CM | POA: Insufficient documentation

## 2021-12-13 DIAGNOSIS — N390 Urinary tract infection, site not specified: Secondary | ICD-10-CM | POA: Insufficient documentation

## 2021-12-13 DIAGNOSIS — R590 Localized enlarged lymph nodes: Secondary | ICD-10-CM | POA: Insufficient documentation

## 2021-12-13 DIAGNOSIS — E782 Mixed hyperlipidemia: Secondary | ICD-10-CM | POA: Insufficient documentation

## 2021-12-13 DIAGNOSIS — M1990 Unspecified osteoarthritis, unspecified site: Secondary | ICD-10-CM | POA: Insufficient documentation

## 2021-12-13 DIAGNOSIS — R918 Other nonspecific abnormal finding of lung field: Secondary | ICD-10-CM

## 2021-12-13 DIAGNOSIS — N814 Uterovaginal prolapse, unspecified: Secondary | ICD-10-CM | POA: Insufficient documentation

## 2021-12-13 DIAGNOSIS — N200 Calculus of kidney: Secondary | ICD-10-CM | POA: Insufficient documentation

## 2021-12-13 DIAGNOSIS — E0590 Thyrotoxicosis, unspecified without thyrotoxic crisis or storm: Secondary | ICD-10-CM | POA: Insufficient documentation

## 2021-12-13 DIAGNOSIS — M5030 Other cervical disc degeneration, unspecified cervical region: Secondary | ICD-10-CM | POA: Insufficient documentation

## 2021-12-13 DIAGNOSIS — Z1231 Encounter for screening mammogram for malignant neoplasm of breast: Secondary | ICD-10-CM

## 2021-12-13 HISTORY — DX: Personal history of nicotine dependence: Z87.891

## 2021-12-13 HISTORY — DX: Other ill-defined heart diseases: I51.89

## 2021-12-13 HISTORY — DX: Paroxysmal atrial fibrillation: I48.0

## 2021-12-13 HISTORY — DX: Mixed hyperlipidemia: E78.2

## 2021-12-13 NOTE — Progress Notes (Signed)
Cardiology Office Note:    Date:  12/13/2021   ID:  Amy Hamilton, DOB February 18, 1951, MRN [id number]  PCP:  Leonard Downing, MD  Cardiologist:  Jenean Lindau, MD   Referring MD: Leonard Downing, *    ASSESSMENT:    1. Panlobular emphysema ([place])   2. PAF (paroxysmal atrial fibrillation) ([place])   3. Mixed dyslipidemia   4. Cardiac mass   5. Ex-smoker   6. Lung mass    PLAN:    In order of problems listed above:  Primary prevention stressed with the patient.  Importance of compliance with diet and medication stressed and she vocalized understanding. Paroxysmal atrial fibrillation: I discussed with the patient atrial fibrillation, disease process. Management and therapy including rate and rhythm control, anticoagulation benefits and potential risks were discussed extensively with the patient. Patient had multiple questions which were answered to patient's satisfaction.  Patient is not on anticoagulation because of the fact that she is being evaluated for lung mass and will need bronchoscopy and such in the next few days and it is planned.  She also has not made a decision about anticoagulation.  I have explained the benefits and risks to her at length. Right atrial mass: She will need a TEE to evaluate this especially in view of the fact that her lung mass may be cancer.  She is agreeable and we will set her up for this. Mixed dyslipidemia: On statin therapy and followed by primary care. COPD and ex-smoker.  She quit smoking a couple of weeks ago and I counseled her against smoking.  She has a pulmonologist who follows with this. Patient will be seen in follow-up appointment in 1 month or earlier if the patient has any concerns    Medication Adjustments/Labs and Tests Ordered: Current medicines are reviewed at length with the patient today.  Concerns regarding medicines are outlined above.  No orders of the defined types were placed in this encounter.  No orders of the  defined types were placed in this encounter.    History of Present Illness:    Amy Hamilton is a 71 y.o. female who is being seen today for the evaluation of atrial fibrillation and cardiac mass at the request of Leonard Downing, *.  Patient is a pleasant 71 year old female.  She has past medical history of COPD.  She is an ex-smoker.  She quit smoking recently.  She was admitted to the hospital with palpitations and was found to be in atrial fibrillation with elevated ventricular rate.  This was handled and taken care of.  While at the hospital she was diagnosed to have lung mass suspicious for malignancy.  Also echocardiogram revealed a suspicion of right atrial mass.  She is not on anticoagulation because of pending evaluations for these conditions.  She is not keen on anticoagulation either.  At the time of my evaluation, the patient is alert awake oriented and in no distress.  Past Medical History:  Diagnosis Date   Abdominal pain, acute 01/06/2015   Abnormal EKG 01/06/2015   Acute blood loss anemia 01/07/2015   Acute carpal tunnel syndrome of left wrist 01/07/2015   Anemia    Anxiety    Arthritis    Asthma    Atrial fibrillation with RVR (HCC)    Back pain 10/17/2012   Chest pain, musculoskeletal 01/07/2015   Chronic bilateral low back pain with bilateral sciatica 05/18/2019   Cigarette nicotine dependence without complication 16/60/6301   Closed left  radial fracture 01/06/2015   COPD (chronic obstructive pulmonary disease) (HCC)    DDD (degenerative disc disease), cervical    Depression    Dermatochalasis of both upper eyelids 12/22/2020   Elevated troponin 05/09/2016   Fall at home 12/27/5407   Hardware complicating wound infection ([place]) 02/02/2016   Hip fracture, left ([place]) 10/17/2012   Hyperthyroidism    Hypokalemia 01/07/2015   Hyponatremia 01/06/2015   Hypotension 05/09/2016   Hypothyroidism 10/17/2012   Intertrochanteric fracture of left hip ([place]) 10/23/2012   Kidney stones     Leukocytosis 05/10/2016   Lung mass    Osteomyelitis, 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[place]) 02/02/2016   Pneumonia 2017   Postoperative hematoma 01/07/2015   Left forearm   Right hip pain 05/18/2019   Routine screening for STI (sexually transmitted infection) 02/02/2016   S/P ORIF (open reduction internal fixation) fracture 01/06/2015   Sepsis ([place]) 05/10/2016   Thoracic lymphadenopathy    Urinary tract infection    Urinary tract infection due to extended-spectrum beta lactamase (ESBL) producing Escherichia coli 05/11/2016   Uterine prolapse     Past Surgical History:  Procedure Laterality Date   ABDOMINAL HYSTERECTOMY     ANKLE SURGERY Right    APPLICATION OF WOUND VAC Left 01/06/2015   Procedure: APPLICATION OF WOUND VAC;  Surgeon: Milly Jakob, MD;  Location: [facility];  Service: Orthopedics;  Laterality: Left;   BACK SURGERY     CARPAL TUNNEL RELEASE Left 01/06/2015   Procedure: CARPAL TUNNEL RELEASE;  Surgeon: Milly Jakob, MD;  Location: [facility];  Service: Orthopedics;  Laterality: Left;   FEMUR IM NAIL Left 10/18/2012   Procedure: INTRAMEDULLARY (IM) NAIL FEMORAL;  Surgeon: Meredith Pel, MD;  Location: [facility];  Service: Orthopedics;  Laterality: Left;   I & D EXTREMITY Left 01/06/2015   Procedure: IRRIGATION AND DEBRIDEMENT EXTREMITY, EVACUATION HEMATOMA;  Surgeon: Milly Jakob, MD;  Location: [facility];  Service: Orthopedics;  Laterality: Left;   ORIF ULNAR FRACTURE Left 01/04/2015   Procedure: OPEN REDUCTION INTERNAL FIXATION (ORIF) ULNAR FRACTURE;  Surgeon: Milly Jakob, MD;  Location: [facility];  Service: Orthopedics;  Laterality: Left;   SECONDARY CLOSURE OF WOUND Left 01/15/2015   Procedure: SECONDARY CLOSURE OF WOUND LEFT FOREARM;  Surgeon: Milly Jakob, MD;  Location: [facility];  Service: Orthopedics;  Laterality: Left;   TOTAL HIP ARTHROPLASTY Left     Current Medications: Current Meds  Medication Sig   albuterol (PROVENTIL HFA;VENTOLIN HFA) 108 (90 BASE)  MCG/ACT inhaler Inhale 1-2 puffs into the lungs every 6 (six) hours as needed for wheezing or shortness of breath.   aspirin EC 81 MG EC tablet Take 1 tablet (81 mg total) by mouth daily.   carvedilol (COREG) 3.125 MG tablet Take 3.125 mg by mouth 2 (two) times daily.   cyclobenzaprine (FLEXERIL) 10 MG tablet Take 0.5 tablets (5 mg total) by mouth 2 (two) times daily as needed for muscle spasms.   estradiol (ESTRACE) 0.5 MG tablet Take 0.5 mg by mouth daily at 12 noon.   fluconazole (DIFLUCAN) 200 MG tablet Take 2 tablets (400 mg total) by mouth daily.   FLUoxetine (PROZAC) 40 MG capsule Take 2 capsules (80 mg total) by mouth daily.   fluticasone (FLONASE) 50 MCG/ACT nasal spray Place 2 sprays into both nostrils daily as needed for allergies or rhinitis.   levothyroxine (SYNTHROID, LEVOTHROID) 88 MCG tablet Take 88 mcg by mouth daily before breakfast.   lidocaine (LIDODERM) 5 % Place 1 patch onto the skin  daily. Remove & Discard patch within 12 hours or as directed by MD   lisdexamfetamine (VYVANSE) 70 MG capsule Take 70 mg by mouth daily.   rosuvastatin (CRESTOR) 10 MG tablet Take 10 mg by mouth at bedtime.   SPIRIVA RESPIMAT 2.5 MCG/ACT AERS Take 1 puff by mouth 2 (two) times daily.   SYNTHROID 25 MCG tablet Take 25 mcg by mouth daily.   traZODone (DESYREL) 50 MG tablet Take 100 mg by mouth at bedtime as needed for sleep.   TRELEGY ELLIPTA 100-62.5-25 MCG/ACT AEPB Take 1 puff by mouth daily.   WIXELA INHUB 500-50 MCG/ACT AEPB Inhale 1 puff into the lungs 2 (two) times daily.     Allergies:   Patient has no known allergies.   Social History   Socioeconomic History   Marital status: Married    Spouse name: Not on file   Number of children: Not on file   Years of education: Not on file   Highest education level: Not on file  Occupational History   Not on file  Tobacco Use   Smoking status: Former    Packs/day: 0.25    Types: Cigarettes   Smokeless tobacco: Never  Substance and  Sexual Activity   Alcohol use: No   Drug use: No   Sexual activity: Not on file  Other Topics Concern   Not on file  Social History Narrative   Not on file   Social Determinants of Health   Financial Resource Strain: Not on file  Food Insecurity: Not on file  Transportation Needs: Not on file  Physical Activity: Not on file  Stress: Not on file  Social Connections: Not on file     Family History: The patient's family history includes Cancer in her brother and mother; Diabetes in her father and mother; Hypertension in her father and mother; Stroke in her mother.  ROS:   Please see the history of present illness.    All other systems reviewed and are negative.  EKGs/Labs/Other Studies Reviewed:    The following studies were reviewed today: I discussed my findings with the patient at length.   Recent Labs: 05/19/2021: ALT 8; BUN 12; Creatinine, Ser 0.72; Hemoglobin 12.7; Platelets 329; Potassium 4.3; Sodium 137  Recent Lipid Panel No results found for: CHOL, TRIG, HDL, CHOLHDL, VLDL, LDLCALC, LDLDIRECT  Physical Exam:    VS:  BP 134/70   Pulse 62   Ht 5\' 2"  (1.575 m)   Wt 105 lb 3.2 oz (47.7 kg)   SpO2 94%   BMI 19.24 kg/m     Wt Readings from Last 3 Encounters:  12/13/21 105 lb 3.2 oz (47.7 kg)  08/04/21 114 lb 10.2 oz (52 kg)  07/23/21 115 lb 15.4 oz (52.6 kg)     GEN: Patient is in no acute distress HEENT: Normal NECK: No JVD; No carotid bruits LYMPHATICS: No lymphadenopathy CARDIAC: S1 S2 regular, 2/6 systolic murmur at the apex. RESPIRATORY:  Clear to auscultation without rales, wheezing or rhonchi  ABDOMEN: Soft, non-tender, non-distended MUSCULOSKELETAL:  No edema; No deformity  SKIN: Warm and dry NEUROLOGIC:  Alert and oriented x 3 PSYCHIATRIC:  Normal affect    Signed, Jenean Lindau, MD  12/13/2021 3:23 PM    [facility]

## 2021-12-13 NOTE — Patient Instructions (Signed)
Medication Instructions:  Your physician recommends that you continue on your current medications as directed. Please refer to the Current Medication list given to you today.  *If you need a refill on your cardiac medications before your next appointment, please call your pharmacy*   Lab Work: None If you have labs (blood work) drawn today and your tests are completely normal, you will receive your results only by: [place] (if you have MyChart) OR A paper copy in the mail If you have any lab test that is abnormal or we need to change your treatment, we will call you to review the results.   Testing/Procedures: Dear Amy Hamilton You are scheduled for a TEE/Cardioversion/TEE Cardioversion on June 5th with Dr. Harrell Gave.  Please arrive at the [facility] (Main Entrance A) at [facility]: [address] [place], [place] [address] at 7:00 am. (1 hour prior to procedure unless lab work is needed; if lab work is needed arrive 1.5 hours ahead)  DIET: Nothing to eat or drink after midnight except a sip of water with medications (see medication instructions below)  FYI: For your safety, and to allow [place] to monitor your vital signs accurately during the surgery/procedure we request that   if you have artificial nails, gel coating, SNS etc. Please have those removed prior to your surgery/procedure. Not having the nail coverings /polish removed may result in cancellation or delay of your surgery/procedure.   Labs: Come to:  Please have BMP and CBC drawn at the [facility] office 1 week before TEE  You must have a responsible person to drive you home and stay in the waiting area during your procedure. Failure to do so could result in cancellation.  Bring your insurance cards.  *Special Note: Every effort is made to have your procedure done on time. Occasionally there are emergencies that occur at the hospital that may cause delays. Please be patient if a delay does occur.        Follow-Up: At [facility], you and your health needs are our priority.  As part of our continuing mission to provide you with exceptional heart care, we have created designated Provider Care Teams.  These Care Teams include your primary Cardiologist (physician) and Advanced Practice Providers (APPs -  Physician Assistants and Nurse Practitioners) who all work together to provide you with the care you need, when you need it.  We recommend signing up for the patient portal called "MyChart".  Sign up information is provided on this After Visit Summary.  MyChart is used to connect with patients for Virtual Visits (Telemedicine).  Patients are able to view lab/test results, encounter notes, upcoming appointments, etc.  Non-urgent messages can be sent to your provider as well.   To learn more about what you can do with MyChart, go to [email or web address].    Your next appointment:   1 month(s)  The format for your next appointment:   In Person  Provider:   Jyl Heinz, MD    Other Instructions None  Important Information About Sugar

## 2021-12-14 ENCOUNTER — Encounter (HOSPITAL_COMMUNITY): Payer: Self-pay | Admitting: Cardiology

## 2021-12-15 ENCOUNTER — Ambulatory Visit: Payer: Medicare Other | Admitting: Podiatry

## 2021-12-22 ENCOUNTER — Telehealth: Payer: Self-pay

## 2021-12-22 NOTE — Telephone Encounter (Signed)
We received a letter from Ferd Hibbs, ANP who states that the pt is going to be on Vyvanse 70 mg long term. Pt has afib and CAD. Provider is inquiring if dose should be decreased or dc'd based on cardiac status.

## 2021-12-22 NOTE — Telephone Encounter (Signed)
Vyvanse carries an increased risk of MI, stroke, sudden cardiac death, HR and BP since it's a stimulant. Would need to weigh the risks vs benefits of long ter use. If there is a non-stimulant alternative, would be preferred if possible.

## 2021-12-23 NOTE — Telephone Encounter (Signed)
Letter sent to pcp regarding Vyvanse.

## 2021-12-24 ENCOUNTER — Encounter: Payer: Self-pay | Admitting: Cardiology

## 2021-12-24 DIAGNOSIS — I493 Ventricular premature depolarization: Secondary | ICD-10-CM | POA: Diagnosis not present

## 2021-12-26 ENCOUNTER — Ambulatory Visit
Admission: RE | Admit: 2021-12-26 | Discharge: 2021-12-26 | Disposition: A | Discharge status: Home / Self Care | Payer: Medicare Other | Source: Ambulatory Visit | Attending: Nurse Practitioner | Admitting: Nurse Practitioner

## 2021-12-26 DIAGNOSIS — R4182 Altered mental status, unspecified: Secondary | ICD-10-CM

## 2021-12-27 ENCOUNTER — Ambulatory Visit (HOSPITAL_COMMUNITY)
Admission: RE | Admit: 2021-12-27 | Discharge: 2021-12-27 | Disposition: A | Discharge status: Home / Self Care | Payer: Medicare Other | Attending: Cardiology | Admitting: Cardiology

## 2021-12-27 ENCOUNTER — Encounter (HOSPITAL_COMMUNITY): Payer: Self-pay | Admitting: Anesthesiology

## 2021-12-27 ENCOUNTER — Ambulatory Visit (INDEPENDENT_AMBULATORY_CARE_PROVIDER_SITE_OTHER): Payer: Medicare Other | Admitting: Podiatry

## 2021-12-27 ENCOUNTER — Encounter: Payer: Self-pay | Admitting: Podiatry

## 2021-12-27 ENCOUNTER — Ambulatory Visit (HOSPITAL_COMMUNITY): Payer: Medicare Other

## 2021-12-27 ENCOUNTER — Encounter (HOSPITAL_COMMUNITY)
Admission: RE | Disposition: A | Discharge status: Home / Self Care | Payer: Self-pay | Source: Home / Self Care | Attending: Cardiology

## 2021-12-27 DIAGNOSIS — I5189 Other ill-defined heart diseases: Secondary | ICD-10-CM

## 2021-12-27 DIAGNOSIS — R918 Other nonspecific abnormal finding of lung field: Secondary | ICD-10-CM

## 2021-12-27 DIAGNOSIS — L603 Nail dystrophy: Secondary | ICD-10-CM | POA: Diagnosis not present

## 2021-12-27 DIAGNOSIS — Z538 Procedure and treatment not carried out for other reasons: Secondary | ICD-10-CM | POA: Insufficient documentation

## 2021-12-27 DIAGNOSIS — I519 Heart disease, unspecified: Secondary | ICD-10-CM | POA: Insufficient documentation

## 2021-12-27 DIAGNOSIS — J431 Panlobular emphysema: Secondary | ICD-10-CM

## 2021-12-27 DIAGNOSIS — Z87891 Personal history of nicotine dependence: Secondary | ICD-10-CM

## 2021-12-27 DIAGNOSIS — I480 Paroxysmal atrial fibrillation: Secondary | ICD-10-CM

## 2021-12-27 DIAGNOSIS — E782 Mixed hyperlipidemia: Secondary | ICD-10-CM

## 2021-12-27 SURGERY — CANCELLED PROCEDURE

## 2021-12-27 NOTE — Progress Notes (Signed)
Cardiology/Endoscopy TEE evaluation  Patient presented for outpatient TEE evaluation of right atrial mass. Unfortunately, patient confused, oriented to person only. Husband present. They had bronch done yesterday at [facility]; she repeats about this procedure. She was attempting to leave the room on several occasions.  It appears that MRI brain was attempted yesterday. Per the notes,  "MR BRAIN WO; altered mental status; pt is very confused, patient was moving and talking during exam despite immobilization cushions and coaching; best obtainable images; no prior MR; no known cancer hx; kms **pt would not tolerate any repeats--wanted to get out of the scanner; kms"  On interview, we discussed TEE. She notes frequently getting food stuck and having to regurgitate it. She also notes coffee ground emesis on multiple occasions. She has never had a GI evaluation.  Given the above, it is not safe to proceed with TEE today. She will either need a GI evaluation before attempt at TEE, or she would need a cardiac MRI. Given difficulties with brain MRI, this may require sedation.  Will alert Dr. Geraldo Pitter to the above, and patient will discuss next steps with him.  Buford Dresser, MD, PhD, [facility] at [facility] at [facility] [address] [place], [place] [address] [phone]

## 2021-12-27 NOTE — Progress Notes (Signed)
  Subjective:  Patient ID: Amy Hamilton, female    DOB: 03-28-1951,   MRN: [id number]  Chief Complaint  Patient presents with   Nail Problem    Both big toenails need to be looked at and the left big toenail the nail goes thru the shoe and I have gotten a pedicure    71 y.o. female presents for concern of bilateral great toenail pain. Relates the "toenail comes through the shoes" She recently had a pedicure and that aggravated the pain.  States she is not diabetic. Denies burning tingling or numbness.  . Denies any other pedal complaints. Denies n/v/f/c.   Past Medical History:  Diagnosis Date   Abdominal pain, acute 01/06/2015   Abnormal EKG 01/06/2015   Acute blood loss anemia 01/07/2015   Acute carpal tunnel syndrome of left wrist 01/07/2015   Anemia    Anxiety    Arthritis    Asthma    Atrial fibrillation with RVR (HCC)    Back pain 10/17/2012   Chest pain, musculoskeletal 01/07/2015   Chronic bilateral low back pain with bilateral sciatica 05/18/2019   Cigarette nicotine dependence without complication 67/61/9509   Closed left radial fracture 01/06/2015   COPD (chronic obstructive pulmonary disease) (HCC)    DDD (degenerative disc disease), cervical    Depression    Dermatochalasis of both upper eyelids 12/22/2020   Elevated troponin 05/09/2016   Fall at home 10/18/7122   Hardware complicating wound infection ([place]) 02/02/2016   Hip fracture, left ([place]) 10/17/2012   Hyperthyroidism    Hypokalemia 01/07/2015   Hyponatremia 01/06/2015   Hypotension 05/09/2016   Hypothyroidism 10/17/2012   Intertrochanteric fracture of left hip ([place]) 10/23/2012   Kidney stones    Leukocytosis 05/10/2016   Lung mass    Osteomyelitis, 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[place]) 02/02/2016   Pneumonia 2017   Postoperative hematoma 01/07/2015   Left forearm   Right hip pain 05/18/2019   Routine screening for STI (sexually transmitted infection) 02/02/2016   S/P ORIF (open reduction internal fixation) fracture 01/06/2015    Sepsis ([place]) 05/10/2016   Thoracic lymphadenopathy    Urinary tract infection    Urinary tract infection due to extended-spectrum beta lactamase (ESBL) producing Escherichia coli 05/11/2016   Uterine prolapse     Objective:  Physical Exam: Vascular: DP/PT pulses 2/4 bilateral. CFT <3 seconds. Normal hair growth on digits. No edema.  Skin. No lacerations or abrasions bilateral feet. Bilateral great toenails thickened and elongated with subungal debris.  Musculoskeletal: MMT 5/5 bilateral lower extremities in DF, PF, Inversion and Eversion. Deceased ROM in DF of ankle joint.  Neurological: Sensation intact to light touch.   Assessment:   1. Onychodystrophy      Plan:  Patient was evaluated and treated and all questions answered. -Examined patient -Discussed treatment options for painful dystrophic nails  -hallux nails bilateral debrided back to patient comfort as courtesy -Discussed possible need for nail procedure in the future if any worsening.  -Patient to return as needed for any discomfort in future.    Lorenda Peck, DPM

## 2021-12-30 ENCOUNTER — Other Ambulatory Visit: Payer: Self-pay | Admitting: Hematology and Oncology

## 2021-12-30 DIAGNOSIS — D6489 Other specified anemias: Secondary | ICD-10-CM

## 2021-12-31 ENCOUNTER — Encounter: Payer: Medicare Other | Admitting: Hematology and Oncology

## 2021-12-31 ENCOUNTER — Other Ambulatory Visit: Payer: Medicare Other

## 2022-01-03 ENCOUNTER — Other Ambulatory Visit: Payer: Self-pay | Admitting: Oncology

## 2022-01-03 DIAGNOSIS — C3412 Malignant neoplasm of upper lobe, left bronchus or lung: Secondary | ICD-10-CM

## 2022-01-04 ENCOUNTER — Inpatient Hospital Stay (INDEPENDENT_AMBULATORY_CARE_PROVIDER_SITE_OTHER): Payer: Medicare Other | Admitting: Oncology

## 2022-01-04 ENCOUNTER — Inpatient Hospital Stay: Payer: Medicare Other | Attending: Oncology

## 2022-01-04 ENCOUNTER — Encounter: Payer: Self-pay | Admitting: Oncology

## 2022-01-04 ENCOUNTER — Other Ambulatory Visit: Payer: Self-pay

## 2022-01-04 DIAGNOSIS — C3412 Malignant neoplasm of upper lobe, left bronchus or lung: Secondary | ICD-10-CM | POA: Insufficient documentation

## 2022-01-04 DIAGNOSIS — Z808 Family history of malignant neoplasm of other organs or systems: Secondary | ICD-10-CM

## 2022-01-04 DIAGNOSIS — Z90710 Acquired absence of both cervix and uterus: Secondary | ICD-10-CM

## 2022-01-04 DIAGNOSIS — Z79899 Other long term (current) drug therapy: Secondary | ICD-10-CM | POA: Diagnosis not present

## 2022-01-04 DIAGNOSIS — D6489 Other specified anemias: Secondary | ICD-10-CM

## 2022-01-04 DIAGNOSIS — C3410 Malignant neoplasm of upper lobe, unspecified bronchus or lung: Secondary | ICD-10-CM | POA: Insufficient documentation

## 2022-01-04 HISTORY — DX: Malignant neoplasm of upper lobe, left bronchus or lung: C34.12

## 2022-01-04 HISTORY — DX: Malignant neoplasm of upper lobe, unspecified bronchus or lung: C34.10

## 2022-01-04 LAB — LACTATE DEHYDROGENASE: LDH: 151 U/L (ref 98–192)

## 2022-01-04 LAB — BASIC METABOLIC PANEL
BUN: 9 (ref 4–21)
CO2: 27 — AB (ref 13–22)
Chloride: 99 (ref 99–108)
Creatinine: 0.5 (ref 0.5–1.1)
Glucose: 99
Potassium: 4.4 mEq/L (ref 3.5–5.1)
Sodium: 136 — AB (ref 137–147)

## 2022-01-04 LAB — CBC AND DIFFERENTIAL
HCT: 33 — AB (ref 36–46)
Hemoglobin: 10.5 — AB (ref 12.0–16.0)
Neutrophils Absolute: 5.89
Platelets: 466 10*3/uL — AB (ref 150–400)
WBC: 8.3

## 2022-01-04 LAB — IRON AND TIBC
Iron: 29 ug/dL (ref 28–170)
Saturation Ratios: 8 % — ABNORMAL LOW (ref 10.4–31.8)
TIBC: 353 ug/dL (ref 250–450)
UIBC: 324 ug/dL

## 2022-01-04 LAB — HEPATIC FUNCTION PANEL
ALT: 14 U/L (ref 7–35)
AST: 23 (ref 13–35)
Alkaline Phosphatase: 79 (ref 25–125)
Bilirubin, Total: 0.4

## 2022-01-04 LAB — TSH: TSH: 1.515 u[IU]/mL (ref 0.350–4.500)

## 2022-01-04 LAB — VITAMIN B12: Vitamin B-12: 300 pg/mL (ref 180–914)

## 2022-01-04 LAB — CBC: RBC: 3.74 — AB (ref 3.87–5.11)

## 2022-01-04 LAB — COMPREHENSIVE METABOLIC PANEL
Albumin: 3.8 (ref 3.5–5.0)
Calcium: 9.5 (ref 8.7–10.7)

## 2022-01-04 LAB — FERRITIN: Ferritin: 46 ng/mL (ref 11–307)

## 2022-01-04 LAB — FOLATE: Folate: 19.3 ng/mL (ref >5.9)

## 2022-01-04 NOTE — Progress Notes (Signed)
[facility]  [address] [place],  [place]  [address] [phone]  Clinic Day:  01/04/2022  Referring physician: Ferd Hibbs, NP  HISTORY OF PRESENT ILLNESS:  The patient is a 71 y.o. female who I was asked to consult upon for squamous cell lung cancer.  Her history dates back to early April 2023 when the patient began having progressive shortness of breath and chest pain, which spanned over a week.  The symptoms finally got to the point where she had her husband bring her into the emergency room for further evaluation.  While there, a chest x-ray done showed a large left upper lobe lung mass.  This led to a chest CT being done shortly thereafter, which revealed a 4.5 cm left upper lobe lung mass.  There was also AP window lymphadenopathy seen that was worrisome for nodal metastasis.  She eventually underwent a bronchoscopy with biopsy in early June 2022, whose pathology revealed squamous cell carcinoma.  She comes in today to go over her biopsy and scan results, as well as their implications.  The patient has had intermittent hemoptysis over these past few weeks.  She does not believe she has lost a significant amount of weight.  Of note, she smoked as much as a pack of cigarettes daily for 50 years before quitting a few weeks ago.  PAST MEDICAL HISTORY:   Past Medical History:  Diagnosis Date   Abdominal pain, acute 01/06/2015   Abnormal EKG 01/06/2015   Acute blood loss anemia 01/07/2015   Acute carpal tunnel syndrome of left wrist 01/07/2015   Anemia    Anxiety    Arthritis    Asthma    Atrial fibrillation with RVR (HCC)    Back pain 10/17/2012   Chest pain, musculoskeletal 01/07/2015   Chronic bilateral low back pain with bilateral sciatica 05/18/2019   Cigarette nicotine dependence without complication 11/94/1740   Closed left radial fracture 01/06/2015   COPD (chronic obstructive pulmonary disease) (HCC)    DDD (degenerative disc disease),  cervical    Depression    Dermatochalasis of both upper eyelids 12/22/2020   Elevated troponin 05/09/2016   Fall at home 03/07/4817   Hardware complicating wound infection ([facility]) 02/02/2016   Hip fracture, left ([facility]) 10/17/2012   Hyperthyroidism    Hypokalemia 01/07/2015   Hyponatremia 01/06/2015   Hypotension 05/09/2016   Hypothyroidism 10/17/2012   Intertrochanteric fracture of left hip ([facility]) 10/23/2012   Kidney stones    Leukocytosis 05/10/2016   Lung mass    Osteomyelitis, 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[facility]) 02/02/2016   Pneumonia 2017   Postoperative hematoma 01/07/2015   Left forearm   Right hip pain 05/18/2019   Routine screening for STI (sexually transmitted infection) 02/02/2016   S/P ORIF (open reduction internal fixation) fracture 01/06/2015   Sepsis ([facility]) 05/10/2016   Thoracic lymphadenopathy    Urinary tract infection    Urinary tract infection due to extended-spectrum beta lactamase (ESBL) producing Escherichia coli 05/11/2016   Uterine prolapse     PAST SURGICAL HISTORY:   Past Surgical History:  Procedure Laterality Date   ABDOMINAL HYSTERECTOMY     ANKLE SURGERY Right    APPLICATION OF WOUND VAC Left 01/06/2015   Procedure: APPLICATION OF WOUND VAC;  Surgeon: Milly Jakob, MD;  Location: [facility];  Service: Orthopedics;  Laterality: Left;   BACK SURGERY     CARPAL TUNNEL RELEASE Left 01/06/2015   Procedure: CARPAL TUNNEL RELEASE;  Surgeon: Milly Jakob, MD;  Location:  [facility];  Service: Orthopedics;  Laterality: Left;   FEMUR IM NAIL Left 10/18/2012   Procedure: INTRAMEDULLARY (IM) NAIL FEMORAL;  Surgeon: Meredith Pel, MD;  Location: [facility];  Service: Orthopedics;  Laterality: Left;   I & D EXTREMITY Left 01/06/2015   Procedure: IRRIGATION AND DEBRIDEMENT EXTREMITY, EVACUATION HEMATOMA;  Surgeon: Milly Jakob, MD;  Location: [facility];  Service: Orthopedics;  Laterality: Left;   ORIF ULNAR FRACTURE Left 01/04/2015   Procedure: OPEN REDUCTION INTERNAL FIXATION (ORIF) ULNAR  FRACTURE;  Surgeon: Milly Jakob, MD;  Location: [facility];  Service: Orthopedics;  Laterality: Left;   SECONDARY CLOSURE OF WOUND Left 01/15/2015   Procedure: SECONDARY CLOSURE OF WOUND LEFT FOREARM;  Surgeon: Milly Jakob, MD;  Location: [facility];  Service: Orthopedics;  Laterality: Left;   TOTAL HIP ARTHROPLASTY Left     CURRENT MEDICATIONS:   Current Outpatient Medications  Medication Sig Dispense Refill   albuterol (PROVENTIL HFA;VENTOLIN HFA) 108 (90 BASE) MCG/ACT inhaler Inhale 1-2 puffs into the lungs every 6 (six) hours as needed for wheezing or shortness of breath. 1 Inhaler 0   aspirin EC 81 MG EC tablet Take 1 tablet (81 mg total) by mouth daily. 30 tablet 0   carvedilol (COREG) 3.125 MG tablet Take 3.125 mg by mouth 2 (two) times daily.     lisdexamfetamine (VYVANSE) 70 MG capsule Take 70 mg by mouth daily.     Magnesium Salicylate Tetrahyd (DOANS EXTRA STRENGTH) 580 MG TABS Take 2 tablets by mouth every 6 (six) hours as needed (back pain).     rosuvastatin (CRESTOR) 10 MG tablet Take 10 mg by mouth at bedtime.     SYNTHROID 25 MCG tablet Take 25 mcg by mouth daily before breakfast.     traZODone (DESYREL) 50 MG tablet Take 50-100 mg by mouth at bedtime as needed for sleep.     TRELEGY ELLIPTA 100-62.5-25 MCG/ACT AEPB Take 1 puff by mouth daily.     No current facility-administered medications for this visit.    ALLERGIES:  No Known Allergies  FAMILY HISTORY:   Family History  Problem Relation Age of Onset   Stroke Mother    Cancer Mother    Diabetes Mother    Hypertension Mother    Diabetes Father    Hypertension Father    Throat cancer Brother     SOCIAL HISTORY:  The patient was raised in [place].  She lives in [place] with her husband of 84 years.  They have 3 children and multiple grandchildren.  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  REVIEW OF SYSTEMS:  Review of Systems   Constitutional:  Negative for fatigue and fever.  HENT:   Negative for hearing loss and sore throat.   Eyes:  Negative for eye problems.  Respiratory:  Positive for cough, hemoptysis and shortness of breath. Negative for chest tightness.   Cardiovascular:  Negative for chest pain and palpitations.  Gastrointestinal:  Negative for abdominal distention, abdominal pain, blood in stool, constipation, diarrhea, nausea and vomiting.  Endocrine: Negative for hot flashes.  Genitourinary:  Negative for difficulty urinating, dysuria, frequency, hematuria and nocturia.   Musculoskeletal:  Negative for arthralgias, back pain, gait problem and myalgias.  Skin: Negative.  Negative for itching and rash.  Neurological: Negative.  Negative for dizziness, extremity weakness, gait problem, headaches, light-headedness and numbness.  Hematological: Negative.   Psychiatric/Behavioral: Negative.  Negative for depression and suicidal ideas. The patient is  not nervous/anxious.     PHYSICAL EXAM:  Blood pressure (!) 196/84, pulse (!) 53, temperature 98.1 F (36.7 C), resp. rate 14, height 5\' 2"  (1.575 m), weight 104 lb 12.8 oz (47.5 kg), SpO2 91 %. Wt Readings from Last 3 Encounters:  01/04/22 104 lb 12.8 oz (47.5 kg)  12/13/21 105 lb 3.2 oz (47.7 kg)  08/04/21 114 lb 10.2 oz (52 kg)   Body mass index is 19.17 kg/m. Performance status (ECOG): 1 - Symptomatic but completely ambulatory Physical Exam Constitutional:      Appearance: Normal appearance. She is not ill-appearing.     Comments: A garrulous woman who is tangential in her thinking  HENT:     Mouth/Throat:     Mouth: Mucous membranes are moist.     Pharynx: Oropharynx is clear. No oropharyngeal exudate or posterior oropharyngeal erythema.  Cardiovascular:     Rate and Rhythm: Normal rate and regular rhythm.     Heart sounds: No murmur heard.    No friction rub. No gallop.  Pulmonary:     Effort: Pulmonary effort is normal. No respiratory  distress.     Breath sounds: Normal breath sounds. No wheezing, rhonchi or rales.  Abdominal:     General: Bowel sounds are normal. There is no distension.     Palpations: Abdomen is soft. There is no mass.     Tenderness: There is no abdominal tenderness.  Musculoskeletal:        General: No swelling.     Right lower leg: No edema.     Left lower leg: No edema.  Lymphadenopathy:     Cervical: No cervical adenopathy.     Upper Body:     Right upper body: No supraclavicular or axillary adenopathy.     Left upper body: No supraclavicular or axillary adenopathy.     Lower Body: No right inguinal adenopathy. No left inguinal adenopathy.  Skin:    General: Skin is warm.     Coloration: Skin is not jaundiced.     Findings: No lesion or rash.  Neurological:     General: No focal deficit present.     Mental Status: She is alert and oriented to person, place, and time. Mental status is at baseline.  Psychiatric:        Mood and Affect: Mood normal.        Behavior: Behavior normal.        Thought Content: Thought content normal.    LABS:      Latest Ref Rng & Units 01/04/2022   12:00 AM 05/19/2021    2:21 PM 07/17/2018    4:00 PM  CBC  WBC  8.3     8.1  5.6   Hemoglobin 12.0 - 16.0 10.5     12.7  11.4   Hematocrit 36 - 46 33     38.7  34.9   Platelets 150 - 400 K/uL 466     329  291      This result is from an external source.      Latest Ref Rng & Units 01/04/2022   12:00 AM 05/19/2021    2:21 PM 07/17/2018    4:00 PM  CMP  Glucose 70 - 99 mg/dL  108  103   BUN 4 - 21 9     12  7    Creatinine 0.5 - 1.1 0.5     0.72  0.59   Sodium 137 - 147 136     137  135   Potassium 3.5 - 5.1 mEq/L 4.4     4.3  4.0   Chloride 99 - 108 99     104  99   CO2 13 - 22 27     27  27    Calcium 8.7 - 10.7 9.5     9.3  8.9   Total Protein 6.5 - 8.1 g/dL  6.5    Total Bilirubin 0.3 - 1.2 mg/dL  0.4    Alkaline Phos 25 - 125 79     62    AST 13 - 35 23     17    ALT 7 - 35 U/L 14     8        This result is from an external source.     Latest Reference Range & Units 01/04/22 14:01  Iron 28 - 170 ug/dL 29  UIBC ug/dL 324  TIBC 250 - 450 ug/dL 353  Saturation Ratios 10.4 - 31.8 % 8 (L)  Ferritin 11 - 307 ng/mL 46  Folate >5.9 ng/mL 19.3  Vitamin B12 180 - 914 pg/mL 300  (L): Data is abnormally low  Lab Results  Component Value Date   LDH 151 01/04/2022    STUDIES:  MR BRAIN WO CONTRAST  Result Date: 12/27/2021 CLINICAL DATA:  Altered mental status. EXAM: MRI HEAD WITHOUT CONTRAST TECHNIQUE: Multiplanar, multiecho pulse sequences of the brain and surrounding structures were obtained without intravenous contrast. COMPARISON:  Head CT and MRI 11/10/2021 FINDINGS: The study is moderately motion degraded. Brain: There is no evidence of an acute infarct, intracranial hemorrhage, mass, midline shift, or extra-axial fluid collection. T2 hyperintensities in the cerebral white matter bilaterally are grossly unchanged and nonspecific but compatible with mild chronic small vessel ischemic disease. There is moderate cerebral atrophy with note again made of predominant frontal and temporal lobe involvement with prominent enlargement of the sylvian fissures. Vascular: Major intracranial vascular flow voids are preserved. Skull and upper cervical spine: Unremarkable bone marrow signal. Sinuses/Orbits: Bilateral cataract extraction. Paranasal sinuses and mastoid air cells are clear. Other: None. IMPRESSION: 1. Motion degraded examination without evidence of acute intracranial abnormality. 2. Mild chronic small vessel ischemic disease and moderate cerebral atrophy. Electronically Signed   By: Logan Bores M.D.   On: 12/27/2021 18:22     ASSESSMENT & PLAN:  [name] who I was asked to consult upon for what clinically appears to be stage IIIA squamous cell lung cancer.  This is based upon her chest CT showing a suspicious AP window lymph node that would qualify as N2/stage IIIA disease.  In  clinic today, I went over all of her scans and biopsy results with her.  I do believe a PET scan is necessary to ensure she does not have more occult disease than what her chest CT initially showed.  I will arrange for this PET scan to be done within the next few days.  I will see her back a day after her PET scan is done to go over the results, which will be used to formulate her treatment plan.  The patient understands all the plans discussed today and is in agreement with them.  I do appreciate Ferd Hibbs, NP for his new consult.   Okie Bogacz Macarthur Critchley, MD

## 2022-01-11 ENCOUNTER — Other Ambulatory Visit: Payer: Self-pay | Admitting: Oncology

## 2022-01-11 DIAGNOSIS — C3412 Malignant neoplasm of upper lobe, left bronchus or lung: Secondary | ICD-10-CM

## 2022-01-13 ENCOUNTER — Telehealth: Payer: Self-pay | Admitting: Oncology

## 2022-01-13 ENCOUNTER — Telehealth: Payer: Self-pay

## 2022-01-13 DIAGNOSIS — I5189 Other ill-defined heart diseases: Secondary | ICD-10-CM

## 2022-01-13 NOTE — Telephone Encounter (Signed)
01/13/22 spoke with husband(Gary)and appt given for PET SCAN on 01/21/22@1030AM -[facility]

## 2022-01-13 NOTE — Telephone Encounter (Signed)
Spoke with Amy Hamilton who states that he would rather the pt have the MRI and he feels he can talk her in to laying still rather than having a GI consult. I advised that the GI consult is needed as well. Amy Hamilton verbalized understanding and had no additional questions.

## 2022-01-13 NOTE — Telephone Encounter (Signed)
-----   Message from Jenean Lindau, MD sent at 12/27/2021  8:54 AM EDT ----- As noted please set her up for cardiac MRI ----- Message ----- From: Buford Dresser, MD Sent: 12/27/2021   8:23 AM EDT To: Jenean Lindau, MD  She was not consentable today, and she also noted regurgitation of food and coffee ground emesis intermittently. She'll either need a GI eval before TEE or a cardiac MRI (based on MRI brain, probably will need sedation).

## 2022-01-14 ENCOUNTER — Ambulatory Visit (INDEPENDENT_AMBULATORY_CARE_PROVIDER_SITE_OTHER): Payer: Medicare Other | Admitting: Cardiology

## 2022-01-14 ENCOUNTER — Encounter: Payer: Self-pay | Admitting: Cardiology

## 2022-01-14 VITALS — BP 122/70 | HR 86 | Ht 60.0 in | Wt 107.0 lb

## 2022-01-14 DIAGNOSIS — I480 Paroxysmal atrial fibrillation: Secondary | ICD-10-CM | POA: Diagnosis not present

## 2022-01-14 DIAGNOSIS — I5189 Other ill-defined heart diseases: Secondary | ICD-10-CM

## 2022-01-14 DIAGNOSIS — R918 Other nonspecific abnormal finding of lung field: Secondary | ICD-10-CM | POA: Diagnosis not present

## 2022-01-15 LAB — HEMOGLOBIN AND HEMATOCRIT, BLOOD
Hematocrit: 30.1 % — ABNORMAL LOW (ref 34.0–46.6)
Hemoglobin: 9.9 g/dL — ABNORMAL LOW (ref 11.1–15.9)

## 2022-01-23 NOTE — Progress Notes (Signed)
[facility]  [address] [place],  [place]  [address] [phone]  Clinic Day:  01/24/2022  Referring physician: Ferd Hibbs, NP  HISTORY OF PRESENT ILLNESS:  The patient is a 71 y.o. female who I recently began seeing for locally advanced squamous cell lung cancer.  She comes in today to go over her PET scan images to determine whether she has evidence of occult disease metastasis.  Since her last visit, the patient has been doing well.  She claims she has not smoked in nearly a month since her squamous cell lung cancer diagnosis was made.  PHYSICAL EXAM:  Blood pressure 130/63, pulse (!) 57, temperature 98.5 F (36.9 C), resp. rate 14, height 5' (1.524 m), weight 102 lb 3.2 oz (46.4 kg), SpO2 96 %. Wt Readings from Last 3 Encounters:  01/24/22 102 lb 3.2 oz (46.4 kg)  01/14/22 107 lb (48.5 kg)  01/04/22 104 lb 12.8 oz (47.5 kg)   Body mass index is 19.96 kg/m. Performance status (ECOG): 1 - Symptomatic but completely ambulatory Physical Exam Constitutional:      Appearance: Normal appearance. She is not ill-appearing.     Comments: A garrulous woman who is tangential in her thinking  HENT:     Mouth/Throat:     Mouth: Mucous membranes are moist.     Pharynx: Oropharynx is clear. No oropharyngeal exudate or posterior oropharyngeal erythema.  Cardiovascular:     Rate and Rhythm: Normal rate and regular rhythm.     Heart sounds: No murmur heard.    No friction rub. No gallop.  Pulmonary:     Effort: Pulmonary effort is normal. No respiratory distress.     Breath sounds: Normal breath sounds. No wheezing, rhonchi or rales.  Abdominal:     General: Bowel sounds are normal. There is no distension.     Palpations: Abdomen is soft. There is no mass.     Tenderness: There is no abdominal tenderness.  Musculoskeletal:        General: No swelling.     Right lower leg: No edema.     Left lower leg: No edema.  Lymphadenopathy:     Cervical:  No cervical adenopathy.     Upper Body:     Right upper body: No supraclavicular or axillary adenopathy.     Left upper body: No supraclavicular or axillary adenopathy.     Lower Body: No right inguinal adenopathy. No left inguinal adenopathy.  Skin:    General: Skin is warm.     Coloration: Skin is not jaundiced.     Findings: No lesion or rash.  Neurological:     General: No focal deficit present.     Mental Status: She is alert and oriented to person, place, and time. Mental status is at baseline.  Psychiatric:        Mood and Affect: Mood normal.        Behavior: Behavior normal.        Thought Content: Thought content normal.    STUDIES:  PET Scan: FINDINGS: Mild motion degradation throughout.  Mediastinal blood pool activity: SUV max 1.9  Liver activity: SUV max NA  NECK: No areas of abnormal hypermetabolism.  Incidental CT findings: No gross cervical adenopathy. Motion within the neck is moderate.  CHEST: The previously described central left upper lobe lung mass has enlarged. 5.8 x 5.4 cm and a S.U.V. max of 12.8 on 41/2. Increased from 4.5 x 4.0 cm on 11/08/2021. The  previously described AP window node is now contacted by tumor, suggesting mediastinal invasion and making evaluation of nodal tracer affinity impossible.  There is low-level hypermetabolism corresponding to new anteromedial left upper lobe collapse/consolidation on 39/2, favored to be due to postobstructive volume loss.  No contralateral hypermetabolic nodes.  Incidental CT findings: Aortic and coronary artery calcification. Mild cardiomegaly. Moderate emphysema. Other areas of left upper lobe more peripheral ground-glass and nodular opacities are felt to be slightly increased, likely postobstructive.  ABDOMEN/PELVIS: No abdominopelvic parenchymal or nodal hypermetabolism.  Incidental CT findings: Low-density interpolar left renal 1.5 cm lesion is likely a cyst. Upper pole right renal  collecting system calculus. Adrenal glands not well visualized. Abdominal aortic atherosclerosis. Dependent gallstones. Pelvic floor laxity.  SKELETON: No abnormal marrow activity.  Incidental CT findings: Osteopenia. Proximal left femur fixation.  IMPRESSION: 1. Motion degraded exam. 2. Progression of a left upper lobe primary bronchogenic carcinoma, which is now contiguous with a presumably metastatic AP window node, suggesting mediastinal invasion. Progressive postobstructive pneumonitis and partial collapse within the left upper lobe. 3. No evidence of distant tracer avid metastasis. 4. Incidental findings, including: Aortic atherosclerosis (ICD10-I70.0), coronary artery atherosclerosis and emphysema (ICD10-J43.9). Cholelithiasis. Right nephrolithiasis.  ASSESSMENT & PLAN:  [name] who clinically appears stage IIIB (T4 N2 M0) squamous cell lung cancer.  In clinic today, I went over all of her PET scan images with her, for which she could see she does not have any evidence of disease metastasis.  However, it does appear that her primary lung mass has gotten larger and is associated with mediastinal invasion.  Nevertheless, as there is no evidence of metastatic disease, the goal with this patient is to ultimately cure her of her cancer.  This will commence by giving her concurrent chemoradiation.  Her chemotherapy will consist of weekly carboplatin/paclitaxel.  She was made aware of the side effects which can go along with this regimen, including neuropathy, cytopenias, nausea, and alopecia.  Her radiation will be given on a daily basis as she goes through her weeks of weekly chemotherapy.  Afterwards, the patient will undergo an repeat CT scans.  If her scans show disease improvement, she would then proceed to year-long maintenance immunotherapy to further prevent her risk of disease recurrence/progression.  I will arrange for her to have a port placed through which her upcoming IV  chemotherapy will be given.  I anticipate her definitive therapy to commence within the next 2-3 weeks.  I will see this patient back in approximately 1 month to see how she is doing as she goes toward her initial weeks of definitive chemoradiation.  The patient understands all the plans discussed today and is in agreement with them.  Aydn Ferrara Macarthur Critchley, MD

## 2022-01-24 ENCOUNTER — Inpatient Hospital Stay: Payer: Medicare Other | Attending: Oncology | Admitting: Oncology

## 2022-01-24 VITALS — BP 130/63 | HR 57 | Temp 98.5°F | Resp 14 | Ht 60.0 in | Wt 102.2 lb

## 2022-01-24 DIAGNOSIS — C3412 Malignant neoplasm of upper lobe, left bronchus or lung: Secondary | ICD-10-CM

## 2022-01-24 NOTE — Progress Notes (Signed)
START ON PATHWAY REGIMEN - Non-Small Cell Lung     A cycle is every 7 days, concurrent with RT:     Paclitaxel      Carboplatin   **Always confirm dose/schedule in your pharmacy ordering system**  Patient Characteristics: Preoperative or Nonsurgical Candidate (Clinical Staging), Stage III - Nonsurgical Candidate (Nonsquamous and Squamous), PS = 0, 1 Therapeutic Status: Preoperative or Nonsurgical Candidate (Clinical Staging) AJCC T Category: cT4 AJCC N Category: cN2 AJCC M Category: cM0 AJCC 8 Stage Grouping: IIIB ECOG Performance Status: 1 Intent of Therapy: Curative Intent, Discussed with Patient

## 2022-01-31 ENCOUNTER — Encounter: Payer: Self-pay | Admitting: Oncology

## 2022-02-01 ENCOUNTER — Inpatient Hospital Stay (INDEPENDENT_AMBULATORY_CARE_PROVIDER_SITE_OTHER): Payer: Medicare Other | Admitting: Hematology and Oncology

## 2022-02-01 ENCOUNTER — Encounter: Payer: Self-pay | Admitting: Hematology and Oncology

## 2022-02-01 VITALS — BP 133/61 | HR 63 | Temp 98.5°F | Resp 18 | Ht 61.2 in | Wt 104.6 lb

## 2022-02-01 DIAGNOSIS — C3412 Malignant neoplasm of upper lobe, left bronchus or lung: Secondary | ICD-10-CM

## 2022-02-01 NOTE — Progress Notes (Unsigned)
[place] [facility] NOTE  Patient Care Team: Ferd Hibbs, NP as PCP - General (Nurse Practitioner)   Name of the patient: Amy Hamilton  [id number]  [birth year]   Date of visit: 02/01/22  Diagnosis- Lung Cancer  Chief complaint/Reason for visit- Initial Meeting for [facility], preparing for starting chemotherapy   Heme/Onc history:  Oncology History  Lung cancer, upper lobe ([place])  01/04/2022 Initial Diagnosis   Lung cancer, upper lobe ([place])   01/24/2022 Cancer Staging   Staging form: Lung, AJCC 8th Edition - Clinical stage from 01/24/2022: Stage IIIB (cT4, cN2, cM0) - Signed by Marice Potter, MD on 01/24/2022 Histopathologic type: Squamous cell carcinoma, NOS   02/09/2022 -  Chemotherapy   Patient is on Treatment Plan : LUNG Carboplatin / Paclitaxel + XRT q7d       Interval history-  Patient presents to chemo care clinic today for initial meeting in preparation for starting chemotherapy. I introduced the chemo care clinic and we discussed that the role of the clinic is to assist those who are at an increased risk of emergency room visits and/or complications during the course of chemotherapy treatment. We discussed that the increased risk takes into account factors such as 71, performance status, and co-morbidities. We also discussed that for some, this might include barriers to care such as not having a primary care provider, lack of insurance/transportation, or not being able to afford medications. We discussed that the goal of the program is to help prevent unplanned ER visits and help reduce complications during chemotherapy. We do this by discussing specific risk factors to each individual and identifying ways that we can help improve these risk factors and reduce barriers to care.   No Known Allergies  Past Medical History:  Diagnosis Date   Abdominal pain, acute 01/06/2015   Abnormal EKG 01/06/2015   Acute blood loss anemia 01/07/2015   Acute carpal tunnel  syndrome of left wrist 01/07/2015   Anemia    Anxiety    Arthritis    Asthma    Atrial fibrillation with RVR (HCC)    Back pain 10/17/2012   Cardiac mass 12/13/2021   Chest pain, musculoskeletal 01/07/2015   Chronic bilateral low back pain with bilateral sciatica 05/18/2019   Cigarette nicotine dependence without complication 30/86/5784   Closed left radial fracture 01/06/2015   COPD (chronic obstructive pulmonary disease) (HCC)    DDD (degenerative disc disease), cervical    Depression    Dermatochalasis of both upper eyelids 12/22/2020   Elevated troponin 05/09/2016   Ex-smoker 12/13/2021   Fall at home 6/96/2952   Hardware complicating wound infection ([place]) 02/02/2016   Hip fracture, left ([place]) 10/17/2012   Hyperthyroidism    Hypokalemia 01/07/2015   Hyponatremia 01/06/2015   Hypotension 05/09/2016   Hypothyroidism 10/17/2012   Intertrochanteric fracture of left hip ([place]) 10/23/2012   Kidney stones    Leukocytosis 05/10/2016   Lung cancer, upper lobe ([place]) 01/04/2022   Lung mass    Mixed dyslipidemia 12/13/2021   Osteomyelitis, 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[place]) 02/02/2016   PAF (paroxysmal atrial fibrillation) ([place]) 12/13/2021   Pneumonia 2017   Postoperative hematoma 01/07/2015   Left forearm   Right hip pain 05/18/2019   Routine screening for STI (sexually transmitted infection) 02/02/2016   S/P ORIF (open reduction internal fixation) fracture 01/06/2015   Sepsis ([place]) 05/10/2016   Thoracic lymphadenopathy    Urinary tract infection    Urinary tract infection due to extended-spectrum beta lactamase (ESBL) producing  Escherichia coli 05/11/2016   Uterine prolapse     Past Surgical History:  Procedure Laterality Date   ABDOMINAL HYSTERECTOMY     ANKLE SURGERY Right    APPLICATION OF WOUND VAC Left 01/06/2015   Procedure: APPLICATION OF WOUND VAC;  Surgeon: Milly Jakob, MD;  Location: [place];  Service: Orthopedics;  Laterality: Left;   BACK SURGERY     CARPAL TUNNEL RELEASE Left 01/06/2015    Procedure: CARPAL TUNNEL RELEASE;  Surgeon: Milly Jakob, MD;  Location: [facility];  Service: Orthopedics;  Laterality: Left;   FEMUR IM NAIL Left 10/18/2012   Procedure: INTRAMEDULLARY (IM) NAIL FEMORAL;  Surgeon: Meredith Pel, MD;  Location: [place] [place];  Service: Orthopedics;  Laterality: Left;   I & D EXTREMITY Left 01/06/2015   Procedure: IRRIGATION AND DEBRIDEMENT EXTREMITY, EVACUATION HEMATOMA;  Surgeon: Milly Jakob, MD;  Location: [facility] [place];  Service: Orthopedics;  Laterality: Left;   ORIF ULNAR FRACTURE Left 01/04/2015   Procedure: OPEN REDUCTION INTERNAL FIXATION (ORIF) ULNAR FRACTURE;  Surgeon: Milly Jakob, MD;  Location: [place];  Service: Orthopedics;  Laterality: Left;   SECONDARY CLOSURE OF WOUND Left 01/15/2015   Procedure: SECONDARY CLOSURE OF WOUND LEFT FOREARM;  Surgeon: Milly Jakob, MD;  Location: [place];  Service: Orthopedics;  Laterality: Left;   TOTAL HIP ARTHROPLASTY Left     Social History   Socioeconomic History   Marital status: Married    Spouse name: GARY   Number of children: 2   Years of education: 11   Highest education level: Not on file  Occupational History   Occupation: RETIRED - [employer]  Tobacco Use   Smoking status: Former    Packs/day: 0.25    Types: Cigarettes   Smokeless tobacco: Never  Vaping Use   Vaping Use: Never used  Substance and Sexual Activity   Alcohol use: No   Drug use: No   Sexual activity: Not Currently  Other Topics Concern   Not on file  Social History Narrative   Not on file   Social Determinants of Health   Financial Resource Strain: Not on file  Food Insecurity: Not on file  Transportation Needs: Not on file  Physical Activity: Not on file  Stress: Not on file  Social Connections: Not on file  Intimate Partner Violence: Not on file    Family History  Problem Relation Age of Onset   Stroke Mother    Cancer Mother    Diabetes Mother    Hypertension Mother    Diabetes Father     Hypertension Father    Throat cancer Brother      Current Outpatient Medications:    albuterol (PROVENTIL HFA;VENTOLIN HFA) 108 (90 BASE) MCG/ACT inhaler, Inhale 1-2 puffs into the lungs every 6 (six) hours as needed for wheezing or shortness of breath., Disp: 1 Inhaler, Rfl: 0   aspirin EC 81 MG EC tablet, Take 1 tablet (81 mg total) by mouth daily., Disp: 30 tablet, Rfl: 0   carvedilol (COREG) 3.125 MG tablet, Take 3.125 mg by mouth 2 (two) times daily., Disp: , Rfl:    lisdexamfetamine (VYVANSE) 70 MG capsule, Take 70 mg by mouth daily. (Patient not taking: Reported on 01/14/2022), Disp: , Rfl:    Magnesium Salicylate Tetrahyd (DOANS EXTRA STRENGTH) 580 MG TABS, Take 2 tablets by mouth every 6 (six) hours as needed (back pain)., Disp: , Rfl:    rosuvastatin (CRESTOR) 10 MG tablet, Take 10 mg by mouth at bedtime., Disp: ,  Rfl:    SYNTHROID 25 MCG tablet, Take 25 mcg by mouth daily before breakfast., Disp: , Rfl:    traZODone (DESYREL) 50 MG tablet, Take 50-100 mg by mouth at bedtime as needed for sleep., Disp: , Rfl:    TRELEGY ELLIPTA 100-62.5-25 MCG/ACT AEPB, Take 1 puff by mouth daily., Disp: , Rfl:      Latest Ref Rng & Units 01/04/2022   12:00 AM  CMP  BUN 4 - 21 9      Creatinine 0.5 - 1.1 0.5      Sodium 137 - 147 136      Potassium 3.5 - 5.1 mEq/L 4.4      Chloride 99 - 108 99      CO2 13 - 22 27      Calcium 8.7 - 10.7 9.5      Alkaline Phos 25 - 125 79      AST 13 - 35 23      ALT 7 - 35 U/L 14         This result is from an external source.      Latest Ref Rng & Units 01/14/2022    3:25 PM  CBC  Hemoglobin 11.1 - 15.9 g/dL 9.9   Hematocrit 34.0 - 46.6 % 30.1     No images are attached to the encounter.  No results found.   Assessment and plan- Patient is a 71 y.o. female who presents to [facility] for initial meeting in preparation for starting chemotherapy for the treatment of lung cancer.   Chemo Care Clinic/High Risk for ER/Hospitalization during  chemotherapy- We discussed the role of the chemo care clinic and identified patient specific risk factors. I discussed that patient was identified as high risk primarily based on:  Patient has past medical history positive for: Past Medical History:  Diagnosis Date   Abdominal pain, acute 01/06/2015   Abnormal EKG 01/06/2015   Acute blood loss anemia 01/07/2015   Acute carpal tunnel syndrome of left wrist 01/07/2015   Anemia    Anxiety    Arthritis    Asthma    Atrial fibrillation with RVR (HCC)    Back pain 10/17/2012   Cardiac mass 12/13/2021   Chest pain, musculoskeletal 01/07/2015   Chronic bilateral low back pain with bilateral sciatica 05/18/2019   Cigarette nicotine dependence without complication 20/35/5974   Closed left radial fracture 01/06/2015   COPD (chronic obstructive pulmonary disease) (HCC)    DDD (degenerative disc disease), cervical    Depression    Dermatochalasis of both upper eyelids 12/22/2020   Elevated troponin 05/09/2016   Ex-smoker 12/13/2021   Fall at home 1/63/8453   Hardware complicating wound infection ([place]) 02/02/2016   Hip fracture, left ([place]) 10/17/2012   Hyperthyroidism    Hypokalemia 01/07/2015   Hyponatremia 01/06/2015   Hypotension 05/09/2016   Hypothyroidism 10/17/2012   Intertrochanteric fracture of left hip ([place]) 10/23/2012   Kidney stones    Leukocytosis 05/10/2016   Lung cancer, upper lobe ([place]) 01/04/2022   Lung mass    Mixed dyslipidemia 12/13/2021   Osteomyelitis, 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[place]) 02/02/2016   PAF (paroxysmal atrial fibrillation) ([place]) 12/13/2021   Pneumonia 2017   Postoperative hematoma 01/07/2015   Left forearm   Right hip pain 05/18/2019   Routine screening for STI (sexually transmitted infection) 02/02/2016   S/P ORIF (open reduction internal fixation) fracture 01/06/2015   Sepsis ([place]) 05/10/2016   Thoracic lymphadenopathy    Urinary tract infection  Urinary tract infection due to extended-spectrum beta lactamase (ESBL) producing  Escherichia coli 05/11/2016   Uterine prolapse     Patient has past surgical history positive for: Past Surgical History:  Procedure Laterality Date   ABDOMINAL HYSTERECTOMY     ANKLE SURGERY Right    APPLICATION OF WOUND VAC Left 01/06/2015   Procedure: APPLICATION OF WOUND VAC;  Surgeon: Milly Jakob, MD;  Location: [facility];  Service: Orthopedics;  Laterality: Left;   BACK SURGERY     CARPAL TUNNEL RELEASE Left 01/06/2015   Procedure: CARPAL TUNNEL RELEASE;  Surgeon: Milly Jakob, MD;  Location: [facility];  Service: Orthopedics;  Laterality: Left;   FEMUR IM NAIL Left 10/18/2012   Procedure: INTRAMEDULLARY (IM) NAIL FEMORAL;  Surgeon: Meredith Pel, MD;  Location: [facility];  Service: Orthopedics;  Laterality: Left;   I & D EXTREMITY Left 01/06/2015   Procedure: IRRIGATION AND DEBRIDEMENT EXTREMITY, EVACUATION HEMATOMA;  Surgeon: Milly Jakob, MD;  Location: [facility];  Service: Orthopedics;  Laterality: Left;   ORIF ULNAR FRACTURE Left 01/04/2015   Procedure: OPEN REDUCTION INTERNAL FIXATION (ORIF) ULNAR FRACTURE;  Surgeon: Milly Jakob, MD;  Location: [facility];  Service: Orthopedics;  Laterality: Left;   SECONDARY CLOSURE OF WOUND Left 01/15/2015   Procedure: SECONDARY CLOSURE OF WOUND LEFT FOREARM;  Surgeon: Milly Jakob, MD;  Location: [place];  Service: Orthopedics;  Laterality: Left;   TOTAL HIP ARTHROPLASTY Left     Provided general information including the following: 1.  Date of education: 02/01/2022 2.  Physician name: Dr. Bobby Rumpf 3.  Diagnosis: Lung Cancer 4.  Stage: Stage IIIB 5.  Curative  6.  Chemotherapy plan including drugs and how often: Carboplatin, Paclitaxel 7.  Start date: Pending authorization 8.  Other referrals: None at this time 9.  The patient is to call our office with any questions or concerns.  Our office number [phone], if after hours or on the weekend, call the same number and wait for the answering service.  There is always an  oncologist on call 10.  Medications prescribed: Ondansetron, Prochlorperazine 11.  The patient has verbalized understanding of the treatment plan and has no barriers to adherence or understanding.  Obtained signed consent from patient.  Discussed symptoms including 1.  Low blood counts including red blood cells, white blood cells and platelets. 2. Infection including to avoid large crowds, wash hands frequently, and stay away from people who were sick.  If fever develops of 100.4 or higher, call our office. 3.  Mucositis-given instructions on mouth rinse (baking soda and salt mixture).  Keep mouth clean.  Use soft bristle toothbrush.  If mouth sores develop, call our clinic. 4.  Nausea/vomiting-gave prescriptions for ondansetron 4 mg every 4 hours as needed for nausea, may take around the clock if persistent.  Compazine 10 mg every 6 hours, may take around the clock if persistent. 5.  Diarrhea-use over-the-counter Imodium.  Call clinic if not controlled. 6.  Constipation-use senna, 1 to 2 tablets twice a day.  If no BM in 2 to 3 days call the clinic. 7.  Loss of appetite-try to eat small meals every 2-3 hours.  Call clinic if not eating. 8.  Taste changes-zinc 500 mg daily.  If becomes severe call clinic. 9.  Alcoholic beverages. 10.  Drink 2 to 3 quarts of water per day. 11.  Peripheral neuropathy-patient to call if numbness or tingling in hands or feet is persistent  Neulasta-will be given 24 to 48 hours after  chemotherapy.  Gave information sheet on bone and joint pain.  Use Claritin or Pepcid.  May use ibuprofen or Aleve.  Call if symptoms persist or are unbearable.  Gave information on the supportive care team and how to contact them regarding services.  Discussed advanced directives.  The patient does not have their advanced directives but will look at the copy provided in their notebook and will call with any questions. Spiritual Nutrition Financial Social worker Advanced  directives  Answered questions to patient satisfaction.  Patient is to call with any further questions or concerns.   The medication prescribed to the patient will be printed out from [place] references This will give the following information: Name of your medication Approved uses Dose and schedule Storage and handling Handling body fluids and waste Drug and food interactions Possible side effects and management Pregnancy, sexual activity, and contraception Obtaining medication  We discussed that social determinants of health may have significant impacts on health and outcomes for cancer patients.  Today we discussed specific social determinants of performance status, alcohol use, depression, financial needs, food insecurity, housing, interpersonal violence, social connections, stress, tobacco use, and transportation.    After lengthy discussion the following were identified as areas of need:   Outpatient services: We discussed options including home based and outpatient services, DME and care program. We discusssed that patients who participate in regular physical activity report fewer negative impacts of cancer and treatments and report less fatigue.   Financial Concerns: We discussed that living with cancer can create tremendous financial burden.  We discussed options for assistance. I asked that if assistance is needed in affording medications or paying bills to please let us know so that we can provide assistance. We discussed options for food including social services and onsite food pantry.  We will also notify Mort Sawyers to see if cancer center can provide additional support.  Referral to Social work: Introduced Education officer, museum Mort Sawyers and the services she can provide such as support with utility bill, cell phone and gas vouchers.   Support groups: We discussed options for support groups at the cancer center. If interested, please notify nurse navigator to enroll. We  discussed options for managing stress including healthy eating, exercise as well as participating in no charge counseling services at the cancer center and support groups.  If these are of interest, patient can notify either myself or primary nursing team.We discussed options for management including medications and referral to quit Smart program  Transportation: We discussed options for transportation.  I have notified primary oncology team who will help assist with arranging Lucianne Lei transportation for appointments when/if needed. We also discussed options for transportation on short notice/acute visits.  Palliative care services: We have palliative care services available in the cancer center to discuss goals of care and advanced care planning.  Please let us know if you have any questions or would like to speak to our palliative nurse practitioner.  Symptom Management Clinic: We discussed our symptom management clinic which is available for acute concerns while receiving treatment such as nausea, vomiting or diarrhea.  We can be reached via telephone at [phone] or through my chart.  We are available for virtual or in person visits on the same day from 830 to 4 PM Monday through Friday. She denies needing specific assistance at this time and She will be followed by Dr. Bobby Rumpf clinical team.  Plan: Discussed symptom management clinic. Discussed palliative care services. Discussed resources that are available here  at the cancer center. Discussed medications and new prescriptions to begin treatment such as anti-nausea or steroids.   Disposition: RTC on   Visit Diagnosis No diagnosis found.  Patient expressed understanding and was in agreement with this plan. She also understands that She can call clinic at any time with any questions, concerns, or complaints.   I provided 30 minutes of  face to face  during this encounter, and > 50% was spent counseling as documented under my assessment & plan.    Dayton Scrape, FNP- [facility]

## 2022-02-02 ENCOUNTER — Encounter: Payer: Self-pay | Admitting: Oncology

## 2022-02-02 ENCOUNTER — Encounter: Payer: Self-pay | Admitting: Hematology and Oncology

## 2022-02-03 ENCOUNTER — Telehealth: Payer: Self-pay

## 2022-02-03 NOTE — Telephone Encounter (Signed)
   Pre-operative Risk Assessment    Patient Name: Amy Hamilton  DOB: 05-14-1951 MRN: [id number]      Request for Surgical Clearance    Procedure:   port a cath insertion  Date of Surgery:  Clearance TBD                                 Surgeon:  Dr. Silas [place] Group or Practice Name:  [facility] [place] Phone number:  [phone] Fax number:  [phone]   Type of Clearance Requested:   - Medical    Type of Anesthesia:  General    Additional requests/questions:    SignedLowella Grip   02/03/2022, 12:49 PM

## 2022-02-07 ENCOUNTER — Other Ambulatory Visit: Payer: Medicare Other

## 2022-02-07 NOTE — Telephone Encounter (Signed)
      Patient Name: Amy Hamilton  DOB: 10/03/1950 MRN: [id number]  Primary Cardiologist: None  Chart reviewed as part of pre-operative protocol coverage.   Per Dr. Geraldo Pitter "Had echo and stress test recently and that was negative.  She is not at high risk for port insertion".  Given past medical history and time since last visit, based on ACC/AHA guidelines, Amy Hamilton would be at acceptable risk for the planned procedure without further cardiovascular testing.   I will route this recommendation to the requesting party via Epic fax function and remove from pre-op pool.  Please call with questions.  [place], [place] 02/07/2022, 12:56 PM

## 2022-02-08 ENCOUNTER — Encounter: Payer: Self-pay | Admitting: Oncology

## 2022-02-08 MED ORDER — ONDANSETRON HCL 8 MG PO TABS: 8.0000 mg | ORAL_TABLET | Freq: Two times a day (BID) | ORAL | 1 refills | Status: DC | PRN

## 2022-02-08 MED ORDER — LIDOCAINE-PRILOCAINE 2.5-2.5 % EX CREA: TOPICAL_CREAM | CUTANEOUS | 3 refills | Status: DC

## 2022-02-08 MED ORDER — PROCHLORPERAZINE MALEATE 10 MG PO TABS: 10.0000 mg | ORAL_TABLET | Freq: Four times a day (QID) | ORAL | 1 refills | Status: DC | PRN

## 2022-02-09 ENCOUNTER — Ambulatory Visit: Payer: Medicare Other

## 2022-02-09 ENCOUNTER — Encounter: Payer: Self-pay | Admitting: Oncology

## 2022-02-10 ENCOUNTER — Ambulatory Visit: Payer: Medicare Other | Admitting: Oncology

## 2022-02-14 ENCOUNTER — Other Ambulatory Visit: Payer: Medicare Other

## 2022-02-16 ENCOUNTER — Ambulatory Visit: Payer: Medicare Other

## 2022-02-17 ENCOUNTER — Telehealth (HOSPITAL_COMMUNITY): Payer: Self-pay | Admitting: *Deleted

## 2022-02-17 NOTE — Telephone Encounter (Signed)
Reaching out to patient to offer assistance regarding upcoming cardiac imaging study; Pt's family answered the phone and states the MRI appointment should be canceled. Patient is now currently under the care of hospice. Will inform the ordering provider.  Gordy Clement RN Navigator Cardiac Imaging [facility] [phone] office [phone] cell

## 2022-02-18 ENCOUNTER — Ambulatory Visit (HOSPITAL_COMMUNITY): Admission: RE | Admit: 2022-02-18 | Payer: Medicare Other | Source: Ambulatory Visit

## 2022-02-18 ENCOUNTER — Encounter (HOSPITAL_COMMUNITY): Payer: Self-pay

## 2022-02-21 ENCOUNTER — Ambulatory Visit: Payer: Medicare Other | Admitting: Hematology and Oncology

## 2022-02-21 ENCOUNTER — Other Ambulatory Visit: Payer: Medicare Other

## 2022-02-23 ENCOUNTER — Ambulatory Visit: Payer: Medicare Other

## 2022-02-28 ENCOUNTER — Other Ambulatory Visit: Payer: Medicare Other

## 2022-03-02 ENCOUNTER — Ambulatory Visit: Payer: Medicare Other

## 2022-03-07 ENCOUNTER — Other Ambulatory Visit: Payer: Medicare Other

## 2022-03-09 ENCOUNTER — Ambulatory Visit: Payer: Medicare Other

## 2022-03-14 ENCOUNTER — Other Ambulatory Visit: Payer: Medicare Other

## 2022-03-16 ENCOUNTER — Ambulatory Visit: Payer: Medicare Other

## 2022-03-21 ENCOUNTER — Other Ambulatory Visit: Payer: Medicare Other

## 2022-03-23 ENCOUNTER — Ambulatory Visit: Payer: Medicare Other

## 2022-03-29 ENCOUNTER — Other Ambulatory Visit: Payer: Medicare Other

## 2022-03-30 ENCOUNTER — Ambulatory Visit: Payer: Medicare Other

## 2022-07-15 ENCOUNTER — Ambulatory Visit: Payer: Medicare Other | Attending: Cardiology | Admitting: Cardiology

## 2022-08-01 ENCOUNTER — Encounter: Payer: Self-pay | Admitting: Cardiology

## 2022-09-23 DEATH — deceased
